# Patient Record
Sex: Male | Born: 1990 | Race: Black or African American | Hispanic: No | Marital: Single | Smoking: Never smoker
Health system: Southern US, Community
[De-identification: ages and names within clinical notes are randomized; demographics above are authoritative.]

## PROBLEM LIST (undated history)

## (undated) DIAGNOSIS — I10 Essential (primary) hypertension: Secondary | ICD-10-CM

## (undated) DIAGNOSIS — E785 Hyperlipidemia, unspecified: Secondary | ICD-10-CM

## (undated) DIAGNOSIS — J189 Pneumonia, unspecified organism: Secondary | ICD-10-CM

## (undated) DIAGNOSIS — N189 Chronic kidney disease, unspecified: Secondary | ICD-10-CM

## (undated) HISTORY — DX: Chronic kidney disease, unspecified: N18.9

---
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
## 2015-10-08 ENCOUNTER — Encounter (HOSPITAL_BASED_OUTPATIENT_CLINIC_OR_DEPARTMENT_OTHER): Payer: Self-pay | Admitting: *Deleted

## 2015-10-08 ENCOUNTER — Inpatient Hospital Stay (HOSPITAL_BASED_OUTPATIENT_CLINIC_OR_DEPARTMENT_OTHER)
Admission: EM | Admit: 2015-10-08 | Discharge: 2015-10-30 | DRG: 981 | Disposition: A | Discharge status: Home / Self Care | Payer: Medicaid Other | Attending: Internal Medicine | Admitting: Internal Medicine

## 2015-10-08 ENCOUNTER — Inpatient Hospital Stay (HOSPITAL_COMMUNITY): Payer: Medicaid Other

## 2015-10-08 DIAGNOSIS — J810 Acute pulmonary edema: Secondary | ICD-10-CM | POA: Diagnosis present

## 2015-10-08 DIAGNOSIS — J189 Pneumonia, unspecified organism: Secondary | ICD-10-CM | POA: Diagnosis not present

## 2015-10-08 DIAGNOSIS — R339 Retention of urine, unspecified: Secondary | ICD-10-CM | POA: Diagnosis not present

## 2015-10-08 DIAGNOSIS — K219 Gastro-esophageal reflux disease without esophagitis: Secondary | ICD-10-CM | POA: Diagnosis present

## 2015-10-08 DIAGNOSIS — I120 Hypertensive chronic kidney disease with stage 5 chronic kidney disease or end stage renal disease: Principal | ICD-10-CM | POA: Diagnosis present

## 2015-10-08 DIAGNOSIS — M311 Thrombotic microangiopathy: Secondary | ICD-10-CM | POA: Diagnosis present

## 2015-10-08 DIAGNOSIS — E876 Hypokalemia: Secondary | ICD-10-CM | POA: Diagnosis present

## 2015-10-08 DIAGNOSIS — Z6834 Body mass index (BMI) 34.0-34.9, adult: Secondary | ICD-10-CM

## 2015-10-08 DIAGNOSIS — J020 Streptococcal pharyngitis: Secondary | ICD-10-CM | POA: Diagnosis present

## 2015-10-08 DIAGNOSIS — Z992 Dependence on renal dialysis: Secondary | ICD-10-CM | POA: Diagnosis not present

## 2015-10-08 DIAGNOSIS — R0902 Hypoxemia: Secondary | ICD-10-CM | POA: Insufficient documentation

## 2015-10-08 DIAGNOSIS — R0600 Dyspnea, unspecified: Secondary | ICD-10-CM | POA: Insufficient documentation

## 2015-10-08 DIAGNOSIS — D72829 Elevated white blood cell count, unspecified: Secondary | ICD-10-CM | POA: Diagnosis present

## 2015-10-08 DIAGNOSIS — R319 Hematuria, unspecified: Secondary | ICD-10-CM | POA: Diagnosis present

## 2015-10-08 DIAGNOSIS — I160 Hypertensive urgency: Secondary | ICD-10-CM | POA: Diagnosis present

## 2015-10-08 DIAGNOSIS — D638 Anemia in other chronic diseases classified elsewhere: Secondary | ICD-10-CM | POA: Diagnosis present

## 2015-10-08 DIAGNOSIS — T380X5A Adverse effect of glucocorticoids and synthetic analogues, initial encounter: Secondary | ICD-10-CM | POA: Diagnosis present

## 2015-10-08 DIAGNOSIS — J9601 Acute respiratory failure with hypoxia: Secondary | ICD-10-CM | POA: Diagnosis not present

## 2015-10-08 DIAGNOSIS — E8770 Fluid overload, unspecified: Secondary | ICD-10-CM | POA: Diagnosis not present

## 2015-10-08 DIAGNOSIS — N186 End stage renal disease: Secondary | ICD-10-CM | POA: Diagnosis present

## 2015-10-08 DIAGNOSIS — E669 Obesity, unspecified: Secondary | ICD-10-CM | POA: Diagnosis present

## 2015-10-08 DIAGNOSIS — E871 Hypo-osmolality and hyponatremia: Secondary | ICD-10-CM | POA: Diagnosis present

## 2015-10-08 DIAGNOSIS — N059 Unspecified nephritic syndrome with unspecified morphologic changes: Secondary | ICD-10-CM

## 2015-10-08 DIAGNOSIS — N008 Acute nephritic syndrome with other morphologic changes: Secondary | ICD-10-CM

## 2015-10-08 DIAGNOSIS — R011 Cardiac murmur, unspecified: Secondary | ICD-10-CM | POA: Diagnosis present

## 2015-10-08 DIAGNOSIS — T1490XA Injury, unspecified, initial encounter: Secondary | ICD-10-CM

## 2015-10-08 DIAGNOSIS — N189 Chronic kidney disease, unspecified: Secondary | ICD-10-CM

## 2015-10-08 DIAGNOSIS — N179 Acute kidney failure, unspecified: Secondary | ICD-10-CM | POA: Insufficient documentation

## 2015-10-08 DIAGNOSIS — R0602 Shortness of breath: Secondary | ICD-10-CM

## 2015-10-08 DIAGNOSIS — Y95 Nosocomial condition: Secondary | ICD-10-CM | POA: Diagnosis not present

## 2015-10-08 DIAGNOSIS — J029 Acute pharyngitis, unspecified: Secondary | ICD-10-CM | POA: Diagnosis present

## 2015-10-08 DIAGNOSIS — D696 Thrombocytopenia, unspecified: Secondary | ICD-10-CM | POA: Diagnosis present

## 2015-10-08 DIAGNOSIS — N170 Acute kidney failure with tubular necrosis: Secondary | ICD-10-CM | POA: Diagnosis present

## 2015-10-08 DIAGNOSIS — I10 Essential (primary) hypertension: Secondary | ICD-10-CM | POA: Diagnosis present

## 2015-10-08 HISTORY — DX: Essential (primary) hypertension: I10

## 2015-10-08 LAB — URINALYSIS, ROUTINE W REFLEX MICROSCOPIC
Bilirubin Urine: NEGATIVE
GLUCOSE, UA: NEGATIVE mg/dL
Ketones, ur: NEGATIVE mg/dL
LEUKOCYTES UA: NEGATIVE
Nitrite: NEGATIVE
PH: 5 (ref 5.0–8.0)
Protein, ur: >300 mg/dL — AB
SPECIFIC GRAVITY, URINE: 1.021 (ref 1.005–1.030)

## 2015-10-08 LAB — BASIC METABOLIC PANEL
ANION GAP: 11 (ref 5–15)
BUN: 57 mg/dL — ABNORMAL HIGH (ref 6–20)
CHLORIDE: 104 mmol/L (ref 101–111)
CO2: 22 mmol/L (ref 22–32)
Calcium: 8.6 mg/dL — ABNORMAL LOW (ref 8.9–10.3)
Creatinine, Ser: 7.17 mg/dL — ABNORMAL HIGH (ref 0.61–1.24)
GFR calc non Af Amer: 9 mL/min — ABNORMAL LOW (ref >60)
GFR, EST AFRICAN AMERICAN: 11 mL/min — AB (ref >60)
Glucose, Bld: 97 mg/dL (ref 65–99)
POTASSIUM: 3.4 mmol/L — AB (ref 3.5–5.1)
Sodium: 137 mmol/L (ref 135–145)

## 2015-10-08 LAB — URINE MICROSCOPIC-ADD ON

## 2015-10-08 LAB — PROTEIN / CREATININE RATIO, URINE
CREATININE, URINE: 122.52 mg/dL
PROTEIN CREATININE RATIO: 3.93 mg/mg{creat} — AB (ref 0.00–0.15)
TOTAL PROTEIN, URINE: 481 mg/dL

## 2015-10-08 LAB — CBC WITH DIFFERENTIAL/PLATELET
BASOS ABS: 0 10*3/uL (ref 0.0–0.1)
BASOS PCT: 0 %
Band Neutrophils: 2 %
EOS PCT: 0 %
Eosinophils Absolute: 0 10*3/uL (ref 0.0–0.7)
HEMATOCRIT: 38.4 % — AB (ref 39.0–52.0)
HEMOGLOBIN: 13.5 g/dL (ref 13.0–17.0)
LYMPHS PCT: 9 %
Lymphs Abs: 1.5 10*3/uL (ref 0.7–4.0)
MCH: 29.2 pg (ref 26.0–34.0)
MCHC: 35.2 g/dL (ref 30.0–36.0)
MCV: 83.1 fL (ref 78.0–100.0)
Monocytes Absolute: 0.7 10*3/uL (ref 0.1–1.0)
Monocytes Relative: 4 %
NEUTROS PCT: 85 %
Neutro Abs: 14.9 10*3/uL — ABNORMAL HIGH (ref 1.7–7.7)
Platelets: 120 10*3/uL — ABNORMAL LOW (ref 150–400)
RBC: 4.62 MIL/uL (ref 4.22–5.81)
RDW: 13.6 % (ref 11.5–15.5)
WBC: 17.1 10*3/uL — AB (ref 4.0–10.5)

## 2015-10-08 LAB — RAPID STREP SCREEN (MED CTR MEBANE ONLY): Streptococcus, Group A Screen (Direct): POSITIVE — AB

## 2015-10-08 MED ORDER — ONDANSETRON HCL 4 MG/2ML IJ SOLN
4.0000 mg | Freq: Four times a day (QID) | INTRAMUSCULAR | Status: DC | PRN
  Administered 2015-10-18 – 2015-10-30 (×7): 4 mg via INTRAVENOUS
  Filled 2015-10-08 (×6): qty 2

## 2015-10-08 MED ORDER — CLONIDINE HCL 0.1 MG PO TABS: 0.1000 mg | ORAL_TABLET | Freq: Two times a day (BID) | ORAL | Status: DC

## 2015-10-08 MED ORDER — ACETAMINOPHEN 325 MG PO TABS
650.0000 mg | ORAL_TABLET | Freq: Four times a day (QID) | ORAL | Status: DC | PRN
  Administered 2015-10-09 – 2015-10-23 (×10): 650 mg via ORAL
  Filled 2015-10-08 (×10): qty 2

## 2015-10-08 MED ORDER — HYDRALAZINE HCL 20 MG/ML IJ SOLN
20.0000 mg | Freq: Four times a day (QID) | INTRAMUSCULAR | Status: DC | PRN
  Administered 2015-10-08 – 2015-10-18 (×13): 20 mg via INTRAVENOUS
  Filled 2015-10-08 (×13): qty 1

## 2015-10-08 MED ORDER — METOPROLOL TARTRATE 25 MG PO TABS
25.0000 mg | ORAL_TABLET | Freq: Two times a day (BID) | ORAL | Status: DC
  Administered 2015-10-08 – 2015-10-09 (×3): 25 mg via ORAL
  Filled 2015-10-08 (×3): qty 1

## 2015-10-08 MED ORDER — HEPARIN SODIUM (PORCINE) 5000 UNIT/ML IJ SOLN
5000.0000 [IU] | Freq: Three times a day (TID) | INTRAMUSCULAR | Status: AC
  Administered 2015-10-08 – 2015-10-14 (×16): 5000 [IU] via SUBCUTANEOUS
  Filled 2015-10-08 (×13): qty 1

## 2015-10-08 MED ORDER — AMOXICILLIN 500 MG PO CAPS
500.0000 mg | ORAL_CAPSULE | Freq: Two times a day (BID) | ORAL | Status: DC
  Administered 2015-10-08 – 2015-10-14 (×12): 500 mg via ORAL
  Filled 2015-10-08 (×13): qty 1

## 2015-10-08 MED ORDER — SODIUM CHLORIDE 0.9 % IV SOLN: INTRAVENOUS | Status: DC

## 2015-10-08 MED ORDER — CLONIDINE HCL 0.2 MG PO TABS
0.2000 mg | ORAL_TABLET | Freq: Two times a day (BID) | ORAL | Status: DC
  Administered 2015-10-08 – 2015-10-09 (×2): 0.2 mg via ORAL
  Filled 2015-10-08 (×2): qty 1

## 2015-10-08 MED ORDER — ACETAMINOPHEN 650 MG RE SUPP: 650.0000 mg | Freq: Four times a day (QID) | RECTAL | Status: DC | PRN

## 2015-10-08 MED ORDER — LABETALOL HCL 5 MG/ML IV SOLN
10.0000 mg | Freq: Once | INTRAVENOUS | Status: AC
  Administered 2015-10-08: 10 mg via INTRAVENOUS
  Filled 2015-10-08: qty 4

## 2015-10-08 MED ORDER — HYDROCODONE-ACETAMINOPHEN 5-325 MG PO TABS
1.0000 | ORAL_TABLET | ORAL | Status: DC | PRN
  Administered 2015-10-10: 1 via ORAL
  Administered 2015-10-15: 2 via ORAL
  Administered 2015-10-15: 1 via ORAL
  Administered 2015-10-19 – 2015-10-20 (×4): 2 via ORAL
  Administered 2015-10-20: 1 via ORAL
  Administered 2015-10-20 – 2015-10-29 (×9): 2 via ORAL
  Filled 2015-10-08 (×6): qty 2
  Filled 2015-10-08: qty 1
  Filled 2015-10-08 (×2): qty 2
  Filled 2015-10-08: qty 1
  Filled 2015-10-08 (×3): qty 2
  Filled 2015-10-08: qty 1
  Filled 2015-10-08 (×4): qty 2

## 2015-10-08 MED ORDER — POLYETHYLENE GLYCOL 3350 17 G PO PACK
17.0000 g | PACK | Freq: Every day | ORAL | Status: DC | PRN
  Filled 2015-10-08: qty 1

## 2015-10-08 MED ORDER — MORPHINE SULFATE (PF) 2 MG/ML IV SOLN: 1.0000 mg | INTRAVENOUS | Status: DC | PRN

## 2015-10-08 MED ORDER — CLONIDINE HCL 0.1 MG PO TABS
0.1000 mg | ORAL_TABLET | Freq: Every day | ORAL | Status: DC
  Administered 2015-10-08: 0.1 mg via ORAL
  Filled 2015-10-08: qty 1

## 2015-10-08 MED ORDER — SODIUM CHLORIDE 0.9 % IV BOLUS (SEPSIS)
1000.0000 mL | Freq: Once | INTRAVENOUS | Status: AC
  Administered 2015-10-08: 1000 mL via INTRAVENOUS

## 2015-10-08 MED ORDER — AMLODIPINE BESYLATE 10 MG PO TABS
10.0000 mg | ORAL_TABLET | Freq: Every day | ORAL | Status: DC
  Administered 2015-10-08 – 2015-10-30 (×22): 10 mg via ORAL
  Filled 2015-10-08 (×23): qty 1

## 2015-10-08 MED ORDER — ONDANSETRON HCL 4 MG PO TABS
4.0000 mg | ORAL_TABLET | Freq: Four times a day (QID) | ORAL | Status: DC | PRN
  Administered 2015-10-19 – 2015-10-28 (×3): 4 mg via ORAL
  Filled 2015-10-08 (×3): qty 1

## 2015-10-08 MED ORDER — CEPHALEXIN 250 MG PO CAPS
500.0000 mg | ORAL_CAPSULE | Freq: Once | ORAL | Status: AC
  Administered 2015-10-08: 500 mg via ORAL
  Filled 2015-10-08: qty 2

## 2015-10-08 NOTE — Progress Notes (Signed)
10/08/2015 3:30 PM  Spoke with Claris CheMargaret with Infection prevention--isolation precautions/droplet precautions NOT needed with strep diagnosis in adults.  No precautions initiated.    Theadora RamaKIRKMAN, Lumina Gitto Brooke

## 2015-10-08 NOTE — Consult Note (Addendum)
Renal Consultation Requesting Physician:  Dr. Montez Morita Reason for Consult:  Renal failure  HPI: The patient is a 25 y.o. year-old who presented to [facility] with PMH significant only for HTN, with a 1 week history of fever, cough, sore throat, weakness.  His evaluation there was significant for hypertension (204/152), leukocytosis to 17,000, mild thrombocytopenia of 120K, creatinine of 7.17 and baseline creatinine not known. Urinalysis reveals >300 protein, large blood, TNTC RBC's. Rapid strep test +. We are asked to see.   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  Has been compliant with meds, but was not doing routine home monitoring. Says took BP with his mother's machine today and it was "off the scale". He also says that he was told in 2014 that his "kidney function was a little off" but "not too bad". States has had labs done at the [facility] in the past 3 years (currently not open). He's not sure what his creatinine was. He has had ankle edema mild L>R since 2014. No FH of renal disease. No drug use.   Reports to me sick last week with sore throat - he "assumed" it was strep but he did not see anyone about it. Had chills and fever for a couple of days, +cough, mild SOB. No arthralgias or skin rash. Saw a "drop" of blood in his urine, but the urine otherwise clear, no tea or coca cola colored urine. No foam.  Has been eating and drinking normally this week. No nausea or vomiting.    CREATININE, SER  Date/Time Value Ref Range Status  10/08/2015 09:20 AM 7.17* 0.61 - 1.24 mg/dL Final    Past Medical History  Diagnosis Date  . Hypertension    Past Surgical History  Procedure Laterality Date  . Pilonidal cyst / sinus excision     Family History  Problem Relation Age of Onset  . Hypertension Mother   . Diabetes Mother   . Hypertension Brother   . Diabetes Sister   . CAD Father    Social History:   reports that he has never smoked. He has never used smokeless tobacco. He reports that he does not drink alcohol or use illicit drugs.  Allergies: No Known Allergies  Home medications: Prior to Admission medications   Medication Sig Start Date End Date Taking? Authorizing Provider  amLODipine (NORVASC) 10 MG tablet Take 10 mg by mouth daily.   Yes Historical Provider, MD  aspirin EC 325 MG tablet Take 325 mg by mouth daily as needed (for headaches).   Yes Historical Provider, MD  cloNIDine (CATAPRES) 0.1 MG tablet Take 0.1 mg by mouth daily.   Yes Historical Provider, MD  furosemide (LASIX) 20 MG tablet Take 20 mg by mouth 2 (two) times daily.   Yes Historical Provider, MD  metoprolol tartrate (LOPRESSOR) 25 MG tablet Take 25 mg by mouth 2 (two) times daily.   Yes Historical Provider, MD    Inpatient medications: . amLODipine  10 mg Oral Daily  . amoxicillin  500 mg Oral Q12H  . cloNIDine  0.1 mg Oral Daily  . heparin  5,000 Units Subcutaneous Q8H  . metoprolol tartrate  25 mg Oral BID    Review of Systems See HPI   Physical Exam:  BP 192/124 mmHg  Pulse 70  Temp(Src) 97.7 F (36.5 C) (Oral)  Resp 18  Ht  (1.778 m)  Wt 161.096  kg (240 lb)  BMI 34.44 kg/m2  SpO2 99%  Very nice soft spoken large framed AAM, NAD VS as noted No skin rashes noted No JVD Lungs clear PMI lat displaced S1S2 + S4 No S3 Soft 1/6 murmur LSB no diastolic murmur or rub Abdomen obese. BS normal. Liver edge not felt. No focal abd tenderness 1+ edema pitting both LE's, L ankle edema >R No asterixus Neuro: alert, Ox3, no focal deficit Heme/Lymph: no bruising or LAN   Labs:   Recent Labs Lab 10/08/15 0920  NA 137  K 3.4*  CL 104  CO2 22  GLUCOSE 97  BUN 57*  CREATININE 7.17*  CALCIUM 8.6*      Recent Labs Lab 10/08/15 0920  WBC 17.1*  NEUTROABS 14.9*  HGB 13.5  HCT 38.4*  MCV 83.1  PLT 120*    Xrays/Other Studies: [facility]  10/08/2015  CLINICAL DATA:  Acute renal  failure. Hematuria today. Weakness, fever and fatigue. Strep pharyngitis for the past week. EXAM: RENAL / URINARY TRACT ULTRASOUND COMPLETE COMPARISON:  06/14/2012. FINDINGS: Right Kidney: Length: 11.1 cm. Markedly echogenic with marked progression. No hydronephrosis, mass or calculi seen. Left Kidney: Length: 11.1 cm. Markedly echogenic with marked progression. Poorly visualized collecting system without gross hydronephrosis. No mass or calculi seen. Bladder: Appears normal for degree of bladder distention. Additional finding:  Small right pleural effusion. IMPRESSION: 1. Interval markedly increased echogenicity of both kidneys compatible with severe medical renal disease. 2. No definite hydronephrosis. 3. Small right pleural effusion. Electronically Signed   By: Beckie SaltsSteven  Reid M.D.   On: 10/08/2015 16:56   Background 25 y.o. year-old who presented to [facility] with PMH significant for HTN, with a 1 week history of fever, cough, sore throat, weakness (he called it strep throat - but was not tested anywhere).  His evaluation there was significant for hypertension (204/152), leukocytosis to 17,000, mild thrombocytopenia of 120K, creatinine of 7.17 and baseline creatinine not known. Urinalysis reveals >300 protein, large blood, TNTC RBC's.  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 Reports told at that time that "kidney function was a little off" . Has had labs done at the [facility] in the past 3 years (currently not open). Ultrasound shows echodense kidneys. UA with >300 protein, TNTC RBC's.   Assessment/Recommendations  1. Renal failure - proteinuria, hematuria, echodense kidneys, and prior knowledge that "kidney function was not normal" in 2014 may mean this is progression of underlying disease and not acute kidney injury. However, with proteinuria, hematuria, and recent pharyngitis with + strep test, could be dealing with post  infectious GN.   1. Either way, I think once his BP is controlled he will need a renal biopsy for diagnosis . 2. Serologies and urine for GN workup ordered.  3. Would stop IVF as he has edema, and does not appear hypovolemic on exam. 4. Call [facility] in the AM and see if any old labs 2. Uncontrolled HTN  - recent degree of control unknown. Meds need escalation.  1. Increase clonidine to 0.2 BID.  2. Continue amlodipine and metoprolol.  3. Stop IVF (may need to add diuretic next day or so). 4. Cannot set up for renal bx until BP controlled 3. Leukocytosis - + strep. On amoxicillin.  1. CXR (cough/sob) 4. Thrombocytopenia - plts 120K and no other labs to compare 1. Would like to see >150 for safe renal bx  Camille Balynthia Saraphina Lauderbaugh,  MD [facility] [phone] pager 10/08/2015, 4:12 PM

## 2015-10-08 NOTE — Progress Notes (Signed)
Patient accepted for admission to telemetry bed.  Coming from [facility].  Diagnosis: post streptococcal glomerulonephritis.  Has positive rapid strep test.  Has had fever, sore throat, and malaise for one week.  Presents with accelerated HTN, AKI (Creatinine 7), and hematuria.  Plan to consult nephrology upon arrival.

## 2015-10-08 NOTE — ED Notes (Signed)
Patient states he has a one week history of sore throat, fever and generalized fatigue.  States this morning he urinated and there was blood in his urine.

## 2015-10-08 NOTE — ED Provider Notes (Signed)
CSN: [id number]     Arrival date & time 10/08/15  [id number] History   First MD Initiated Contact with Patient 10/08/15 [phone]     Chief Complaint  Patient presents with  . Sore Throat  . Hematuria     (Consider location/radiation/quality/duration/timing/severity/associated sxs/prior Treatment) HPI Comments: Patient is a 25 year old male with history of hypertension. He presents for evaluation of sore throat, intermittent fever, and fatigue for the past week. He reports this morning when he woke up to urinate there is a small amount of blood in his urine. He denies any abdominal pain, dysuria, or flank pain. He denies a history of kidney stones. He denies any ill contacts.  Patient is a 25 y.o. male presenting with pharyngitis. The history is provided by the patient.  Sore Throat This is a new problem. Episode onset: 1 week ago. The problem occurs constantly. The problem has been gradually worsening. The symptoms are aggravated by swallowing. Nothing relieves the symptoms. He has tried acetaminophen for the symptoms.    Past Medical History  Diagnosis Date  . Hypertension    Past Surgical History  Procedure Laterality Date  . Pilonidal cyst / sinus excision     No family history on file. Social History  Substance Use Topics  . Smoking status: Never Smoker   . Smokeless tobacco: Never Used  . Alcohol Use: No    Review of Systems  All other systems reviewed and are negative.     Allergies  Review of patient's allergies indicates no known allergies.  Home Medications   Prior to Admission medications   Medication Sig Start Date End Date Taking? Authorizing Provider  amLODipine (NORVASC) 10 MG tablet Take 10 mg by mouth daily.   Yes Historical Provider, MD  cloNIDine (CATAPRES) 0.1 MG tablet Take 0.1 mg by mouth daily.   Yes Historical Provider, MD  furosemide (LASIX) 20 MG tablet Take 20 mg by mouth 2 (two) times daily.   Yes Historical Provider, MD  metoprolol tartrate  (LOPRESSOR) 25 MG tablet Take 25 mg by mouth 2 (two) times daily.   Yes Historical Provider, MD   BP 204/152 mmHg  Pulse 82  Temp(Src) 98.1 F (36.7 C) (Oral)  Resp 18  Ht 5\' 10"  (1.778 m)  Wt 240 lb (108.863 kg)  BMI 34.44 kg/m2  SpO2 100% Physical Exam  Constitutional: He is oriented to person, place, and time. He appears well-developed and well-nourished. No distress.  HENT:  Head: Normocephalic and atraumatic.  Posterior oropharynx is erythematous.  Neck: Normal range of motion. Neck supple.  Cardiovascular: Normal rate and regular rhythm.  Exam reveals no friction rub.   No murmur heard. Pulmonary/Chest: Effort normal and breath sounds normal. No respiratory distress. He has no wheezes. He has no rales.  Abdominal: Soft. Bowel sounds are normal. He exhibits no distension. There is no tenderness.  Musculoskeletal: Normal range of motion. He exhibits no edema.  Neurological: He is alert and oriented to person, place, and time. Coordination normal.  Skin: Skin is warm and dry. He is not diaphoretic.  Nursing note and vitals reviewed.   ED Course  Procedures (including critical care time) Labs Review Labs Reviewed  RAPID STREP SCREEN (NOT AT [facility])  URINALYSIS, ROUTINE W REFLEX MICROSCOPIC (NOT AT [facility])  BASIC METABOLIC PANEL  CBC WITH DIFFERENTIAL/PLATELET    Imaging Review No results found. I have personally reviewed and evaluated these images and lab results as part of my medical decision-making.    MDM  Final diagnoses:  None    Patient presents with complaints of weakness, fever, fatigue, and sore throat. Based on his laboratory studies, he appears to be in renal failure related to either poststreptococcal glomerulonephritis or hypertension induced nephropathy. His blood pressures were initially markedly elevated, however did improve while in the ER. Due to the market elevation of his creatinine, I feel as though he will require admission for nephrology  consultation and hydration. He will also be given Keflex for the strep throat. I've spoken with Dr. Montez Moritaarter from the hospitalist service who agrees to admit the patient.    Geoffery Lyonsouglas Halo Shevlin, MD 10/08/15 1030

## 2015-10-08 NOTE — H&P (Signed)
History and Physical    Joseph Villegas ZOX:[id number] DOB: [birth year] DOA: 10/08/2015  PCP: No PCP Per Patient Clinic [facility] Patient coming from: [facility]  Chief Complaint: persistent sore throat, intermittent fever and generalized weakness  HPI: Joseph Villegas is a very pleasnat 25 y.o. male with medical history significant for htn presents to [facility] with the chief complaint of persistent sore throat, intermittent fevers and generalized weakness. Initial evaluation reveals acute renal failure with a creatinine of 7, accelerated hypertension, positive rapid strep test, hematuria, leukocytosis, thrombocytopenia.  Information is obtained from the patient. He reports a one-week history of  intermittent fever and generalized fatigue. In addition this am he noted "small amount blood" at void end. He denies dysuria frequency urgency. He denies a history of kidney stones. He denies any flank pain abdominal pain nausea or vomiting. He states the sore throat is aggravated by swallowing. He reports taking acetaminophen for the symptoms with very little relief. He denies any recent travel or sick contacts. Does report chronic trace lower extremity edema after a days work and reports the edema subsides once feet are elevated. He denies chest pain palpitation shortness of breath cough headache dizziness syncope or near-syncope. He denies any orthopnea.    ED Course: At [facility] he is provided with 20 mg of labetalol, vigorous IV fluids, and Keflex  Review of Systems: As per HPI otherwise 10 point review of systems negative.   Ambulatory Status: Ambulates independently with a steady gait  Past Medical History  Diagnosis Date  . Hypertension     Past Surgical History  Procedure Laterality Date  . Pilonidal cyst / sinus excision      Social History   Social History  . Marital Status: Single    Spouse Name: N/A  . Number of Children: N/A  . Years  of Education: N/A   Occupational History  . Not on file.   Social History Main Topics  . Smoking status: Never Smoker   . Smokeless tobacco: Never Used  . Alcohol Use: No  . Drug Use: No  . Sexual Activity: Not on file   Other Topics Concern  . Not on file   Social History Narrative  . No narrative on file  Patient is single he lives alone he is currently employed full-time as a Administratorlandscaper  No Known Allergies  Family History  Problem Relation Age of Onset  . Hypertension Mother   . Diabetes Mother   . Hypertension Brother   . Diabetes Sister   . CAD Father     Prior to Admission medications   Medication Sig Start Date End Date Taking? Authorizing Provider  amLODipine (NORVASC) 10 MG tablet Take 10 mg by mouth daily.   Yes Historical Provider, MD  aspirin EC 325 MG tablet Take 325 mg by mouth daily as needed (for headaches).   Yes Historical Provider, MD  cloNIDine (CATAPRES) 0.1 MG tablet Take 0.1 mg by mouth daily.   Yes Historical Provider, MD  furosemide (LASIX) 20 MG tablet Take 20 mg by mouth 2 (two) times daily.   Yes Historical Provider, MD  metoprolol tartrate (LOPRESSOR) 25 MG tablet Take 25 mg by mouth 2 (two) times daily.   Yes Historical Provider, MD    Physical Exam: Filed Vitals:   10/08/15 0825 10/08/15 0848 10/08/15 1056 10/08/15 1249  BP: 223/166 204/152 178/126 192/124  Pulse:   65 70  Temp:    97.7 F (  36.5 C)  TempSrc:    Oral  Resp:   20 18  Height:      Weight:      SpO2:   100% 99%     General:  Appears calm and comfortable, somewhat obese Eyes:  PERRL, EOMI, normal lids, iris ENT:  grossly normal hearing, lips & tongue, oropharynx with erythema no exudate noted Neck:  no LAD, masses or thyromegaly Cardiovascular:  RRR, no m/r/g. Trace LE edema.  Respiratory:  CTA bilaterally, no w/r/r. Normal respiratory effort. Abdomen:  soft, ntnd, positive bowel sounds throughout no guarding or rebounding Skin:  no rash or induration seen on  limited exam Musculoskeletal:  grossly normal tone BUE/BLE, good ROM, no bony abnormality Psychiatric:  grossly normal mood and affect, speech fluent and appropriate, AOx3 Neurologic:  CN 2-12 grossly intact, moves all extremities in coordinated fashion, sensation intact  Labs on Admission: I have personally reviewed following labs and imaging studies  CBC:  Recent Labs Lab 10/08/15 0920  WBC 17.1*  NEUTROABS 14.9*  HGB 13.5  HCT 38.4*  MCV 83.1  PLT 120*   Basic Metabolic Panel:  Recent Labs Lab 10/08/15 0920  NA 137  K 3.4*  CL 104  CO2 22  GLUCOSE 97  BUN 57*  CREATININE 7.17*  CALCIUM 8.6*   GFR: Estimated Creatinine Clearance: 19.3 mL/min (by C-G formula based on Cr of 7.17). Liver Function Tests: No results for input(s): AST, ALT, ALKPHOS, BILITOT, PROT, ALBUMIN in the last 168 hours. No results for input(s): LIPASE, AMYLASE in the last 168 hours. No results for input(s): AMMONIA in the last 168 hours. Coagulation Profile: No results for input(s): INR, PROTIME in the last 168 hours. Cardiac Enzymes: No results for input(s): CKTOTAL, CKMB, CKMBINDEX, TROPONINI in the last 168 hours. BNP (last 3 results) No results for input(s): PROBNP in the last 8760 hours. HbA1C: No results for input(s): HGBA1C in the last 72 hours. CBG: No results for input(s): GLUCAP in the last 168 hours. Lipid Profile: No results for input(s): CHOL, HDL, LDLCALC, TRIG, CHOLHDL, LDLDIRECT in the last 72 hours. Thyroid Function Tests: No results for input(s): TSH, T4TOTAL, FREET4, T3FREE, THYROIDAB in the last 72 hours. Anemia Panel: No results for input(s): VITAMINB12, FOLATE, FERRITIN, TIBC, IRON, RETICCTPCT in the last 72 hours. Urine analysis:    Component Value Date/Time   COLORURINE YELLOW 10/08/2015 0905   APPEARANCEUR CLOUDY* 10/08/2015 0905   LABSPEC 1.021 10/08/2015 0905   PHURINE 5.0 10/08/2015 0905   GLUCOSEU NEGATIVE 10/08/2015 0905   HGBUR LARGE* 10/08/2015 0905    BILIRUBINUR NEGATIVE 10/08/2015 0905   KETONESUR NEGATIVE 10/08/2015 0905   PROTEINUR >300* 10/08/2015 0905   NITRITE NEGATIVE 10/08/2015 0905   LEUKOCYTESUR NEGATIVE 10/08/2015 0905    Creatinine Clearance: Estimated Creatinine Clearance: 19.3 mL/min (by C-G formula based on Cr of 7.17).  Sepsis Labs: @LABRCNTIP (procalcitonin:4,lacticidven:4) ) Recent Results (from the past 240 hour(s))  Rapid strep screen     Status: Abnormal   Collection Time: 10/08/15  8:20 AM  Result Value Ref Range Status   Streptococcus, Group A Screen (Direct) POSITIVE (A) NEGATIVE Final     Radiological Exams on Admission: No results found.  EKG:   Assessment/Plan Principal Problem:   Acute kidney injury (HCC) Active Problems:   Post-streptococcal glomerulonephritis   HTN (hypertension)   Thrombocytopenia (HCC)   Hematuria   Leukocytosis   Accelerated hypertension   Streptococcal sore throat   #1. Acute kidney injury. Concern for poststreptococcal glomerulonephritis in setting of  uncontrolled BP. Creatinine 7.1 and BUN 57 potassium 3.4 on admission. Urinalysis with large Hg.  -Admit to telemetry -Obtain a renal ultrasound -Antibiotics for strep throat -gentle IV fluids until hydration recommendations from nephrology -intake and output -daily weight  #2. Accelerated hypertension. Home medications include amlodipine, clonidine, Lasix, metoprolol. He was provided with 20 mg of labetalol at [facility]. Blood pressure on admission 192/124. He reports compliance with blood pressure medication -Continue amlodipine, clonidine, metoprolol -Hold Lasix for now -When necessary hydralazine -monitor closely  #3. Streptococcal sore throat. Positive rapid strep test. Patient given Keflex at [facility]. He is afebrile nontoxic appearing. Does have a leukocytosis of 17.1 -provide amoxicillin  BID starting tonight -monitor -supportive therapy  #4.thrombocytopenia. Platelets  120. Likely related to acute illness. No s/sx bleeding -monitor  #5. Hematuria. Urinalysis as noted above. No pain. No hx stones.  -obtain renal US -see #1    DVT prophylaxis: scd  Code Status: full  Family Communication: none present  Disposition Plan: home  Consults called: [place] with nephrology Admission status: inpatient    Gwenyth Bender MD [facility]  If 7PM-7AM, please contact night-coverage [email or web address] Password [email or web address]  10/08/2015, 2:21 PM

## 2015-10-08 NOTE — ED Notes (Signed)
[facility] is aware of bed 6E10 and here for transfer to [facility].

## 2015-10-08 NOTE — Progress Notes (Signed)
10/08/2015 6:46 PM  BP recheck still elevated over 200 systolically.  PRN hydralazine ordered--administered per order.  Pt vitals stable otherwise, denies chest pain or SOB.  IVF stopped per MD order.  Will continue to monitor. Theadora RamaKIRKMAN, Jurney Overacker Brooke

## 2015-10-09 DIAGNOSIS — R319 Hematuria, unspecified: Secondary | ICD-10-CM

## 2015-10-09 DIAGNOSIS — N059 Unspecified nephritic syndrome with unspecified morphologic changes: Secondary | ICD-10-CM | POA: Insufficient documentation

## 2015-10-09 DIAGNOSIS — D696 Thrombocytopenia, unspecified: Secondary | ICD-10-CM

## 2015-10-09 LAB — C4 COMPLEMENT: COMPLEMENT C4, BODY FLUID: 40 mg/dL (ref 14–44)

## 2015-10-09 LAB — COMPREHENSIVE METABOLIC PANEL
ALT: 24 U/L (ref 17–63)
AST: 18 U/L (ref 15–41)
Albumin: 2.8 g/dL — ABNORMAL LOW (ref 3.5–5.0)
Alkaline Phosphatase: 63 U/L (ref 38–126)
Anion gap: 11 (ref 5–15)
BILIRUBIN TOTAL: 0.5 mg/dL (ref 0.3–1.2)
BUN: 56 mg/dL — AB (ref 6–20)
CO2: 20 mmol/L — ABNORMAL LOW (ref 22–32)
CREATININE: 7.34 mg/dL — AB (ref 0.61–1.24)
Calcium: 8.7 mg/dL — ABNORMAL LOW (ref 8.9–10.3)
Chloride: 106 mmol/L (ref 101–111)
GFR, EST AFRICAN AMERICAN: 11 mL/min — AB (ref >60)
GFR, EST NON AFRICAN AMERICAN: 9 mL/min — AB (ref >60)
Glucose, Bld: 113 mg/dL — ABNORMAL HIGH (ref 65–99)
POTASSIUM: 3.2 mmol/L — AB (ref 3.5–5.1)
Sodium: 137 mmol/L (ref 135–145)
TOTAL PROTEIN: 7.4 g/dL (ref 6.5–8.1)

## 2015-10-09 LAB — PHOSPHORUS: Phosphorus: 4.6 mg/dL (ref 2.5–4.6)

## 2015-10-09 LAB — CBC
HEMATOCRIT: 38.1 % — AB (ref 39.0–52.0)
Hemoglobin: 13.1 g/dL (ref 13.0–17.0)
MCH: 29.1 pg (ref 26.0–34.0)
MCHC: 34.4 g/dL (ref 30.0–36.0)
MCV: 84.7 fL (ref 78.0–100.0)
PLATELETS: 121 10*3/uL — AB (ref 150–400)
RBC: 4.5 MIL/uL (ref 4.22–5.81)
RDW: 13.9 % (ref 11.5–15.5)
WBC: 14.1 10*3/uL — AB (ref 4.0–10.5)

## 2015-10-09 LAB — PLATELET FUNCTION ASSAY

## 2015-10-09 LAB — PROTIME-INR
INR: 1.16 (ref 0.00–1.49)
Prothrombin Time: 14.9 seconds (ref 11.6–15.2)

## 2015-10-09 LAB — ANTISTREPTOLYSIN O TITER: ASO: 70 IU/mL (ref 0.0–200.0)

## 2015-10-09 LAB — MPO/PR-3 (ANCA) ANTIBODIES

## 2015-10-09 LAB — APTT: APTT: 34 s (ref 24–37)

## 2015-10-09 LAB — C3 COMPLEMENT: C3 Complement: 144 mg/dL (ref 82–167)

## 2015-10-09 MED ORDER — LABETALOL HCL 5 MG/ML IV SOLN
10.0000 mg | Freq: Once | INTRAVENOUS | Status: AC
  Administered 2015-10-09: 10 mg via INTRAVENOUS
  Filled 2015-10-09: qty 4

## 2015-10-09 MED ORDER — POTASSIUM CHLORIDE CRYS ER 20 MEQ PO TBCR
20.0000 meq | EXTENDED_RELEASE_TABLET | Freq: Once | ORAL | Status: AC
  Administered 2015-10-09: 20 meq via ORAL
  Filled 2015-10-09: qty 1

## 2015-10-09 MED ORDER — FUROSEMIDE 80 MG PO TABS
160.0000 mg | ORAL_TABLET | Freq: Two times a day (BID) | ORAL | Status: DC
  Administered 2015-10-09 – 2015-10-20 (×22): 160 mg via ORAL
  Filled 2015-10-09 (×2): qty 2
  Filled 2015-10-09: qty 8
  Filled 2015-10-09 (×12): qty 2
  Filled 2015-10-09: qty 4
  Filled 2015-10-09 (×6): qty 2

## 2015-10-09 MED ORDER — CLONIDINE HCL 0.2 MG PO TABS
0.2000 mg | ORAL_TABLET | Freq: Three times a day (TID) | ORAL | Status: DC
  Administered 2015-10-09 – 2015-10-15 (×20): 0.2 mg via ORAL
  Filled 2015-10-09 (×20): qty 1

## 2015-10-09 MED ORDER — CARVEDILOL 25 MG PO TABS
25.0000 mg | ORAL_TABLET | Freq: Two times a day (BID) | ORAL | Status: DC
  Administered 2015-10-09 – 2015-10-30 (×39): 25 mg via ORAL
  Filled 2015-10-09 (×41): qty 1

## 2015-10-09 MED ORDER — CARVEDILOL 6.25 MG PO TABS: 6.2500 mg | ORAL_TABLET | Freq: Two times a day (BID) | ORAL | Status: DC

## 2015-10-09 NOTE — Progress Notes (Signed)
PROGRESS NOTE    Joseph Villegas  JXB:[id number] DOB: Oct 19, 1990 DOA: 10/08/2015 PCP: No PCP Per Patient  Outpatient Specialists:   Brief Narrative: 25 y.o. male with medical history significant for htn presents to [facility] with the chief complaint of persistent sore throat, intermittent fevers and generalized weakness. Initial evaluation reveals acute renal failure with a creatinine of 7, accelerated hypertension, positive rapid strep test, hematuria, leukocytosis, thrombocytopenia. Information is obtained from the patient. He reports a one-week history of intermittent fever and generalized fatigue. In addition this am he noted "small amount blood" at void end. He denies dysuria frequency urgency. He denies a history of kidney stones. He denies any flank pain abdominal pain nausea or vomiting. He states the sore throat is aggravated by swallowing. He reports taking acetaminophen for the symptoms with very little relief. He denies any recent travel or sick contacts. Does report chronic trace lower extremity edema after a days work and reports the edema subsides once feet are elevated. He denies chest pain palpitation shortness of breath cough headache dizziness syncope or near-syncope. He denies any orthopnea.  Assessment & Plan:   Principal Problem:   Acute kidney injury (HCC) Active Problems:   Post-streptococcal glomerulonephritis   HTN (hypertension)   Thrombocytopenia (HCC)   Hematuria   Leukocytosis   Accelerated hypertension   Streptococcal sore throat  Assessment/Plan Principal Problem:  Acute kidney injury (HCC) Active Problems:  Post-streptococcal glomerulonephritis  HTN (hypertension)  Thrombocytopenia (HCC)  Hematuria  Leukocytosis  Accelerated hypertension  Streptococcal sore throat   #1. kidney injury, possible progressive CKD versus AKI on CKD. History of HTN. BP is uncontrolled, with SBP in the 200's. Nephrology input is appreciated - GN work  up and possible renal biopsy is SBP is better controlled.  #2. Accelerated hypertension. Cautiously control. Change Metoprolol to Coreg Increase dose of clonidine. Monitor renal function and electrolytes.  #3. Streptococcal sore throat. Positive rapid strep test. Patient given Keflex at [facility]. He is afebrile nontoxic appearing. Does have a leukocytosis of 17.1 -provide amoxicillin  BID starting tonight -monitor -supportive therapy  #4.thrombocytopenia. Platelets 120. Likely related to acute illness. No s/sx bleeding -Will check acute hepatitis profile  #5. Hematuria. Possibly related to GN.  -see #1  DVT prophylaxis: scd  Code Status: full  Family Communication: none present  Disposition Plan: home  Consults called: dunham with nephrology Admission status: inpatient   Subjective: No complaints. No SOB or chest pain. SBP is still significantly elevated  Objective: Filed Vitals:   10/09/15 0203 10/09/15 0400 10/09/15 0451 10/09/15 0552  BP: 223/128 209/125 199/128 213/135  Pulse: 96 97 91 91  Temp:  98.2 F (36.8 C)    TempSrc:  Oral    Resp:  20    Height:      Weight:      SpO2:  97%      Intake/Output Summary (Last 24 hours) at 10/09/15 0901 Last data filed at 10/09/15 0630  Gross per 24 hour  Intake    360 ml  Output    500 ml  Net   -140 ml   Filed Weights   10/08/15 0819 10/08/15 2043  Weight: 108.863 kg (240 lb) 109.77 kg (242 lb)    Examination:  General exam: Appears calm and comfortable  Respiratory system: Clear to auscultation.  Cardiovascular system: S1 & S2 Gastrointestinal system: Abdomen is obese, soft and non tender. Central nervous system: Alert and oriented. Moves all limbs. Extremities: Leg edema.  Data Reviewed: I have personally reviewed following labs and imaging studies  CBC:  Recent Labs Lab 10/08/15 0920 10/09/15 0424  WBC 17.1* 14.1*  NEUTROABS 14.9*  --   HGB 13.5 13.1  HCT 38.4* 38.1*  MCV 83.1  84.7  PLT 120* 121*   Basic Metabolic Panel:  Recent Labs Lab 10/08/15 0920 10/09/15 0424  NA 137 137  K 3.4* 3.2*  CL 104 106  CO2 22 20*  GLUCOSE 97 113*  BUN 57* 56*  CREATININE 7.17* 7.34*  CALCIUM 8.6* 8.7*  PHOS  --  4.6   GFR: Estimated Creatinine Clearance: 18.9 mL/min (by C-G formula based on Cr of 7.34). Liver Function Tests:  Recent Labs Lab 10/09/15 0424  AST 18  ALT 24  ALKPHOS 63  BILITOT 0.5  PROT 7.4  ALBUMIN 2.8*   No results for input(s): LIPASE, AMYLASE in the last 168 hours. No results for input(s): AMMONIA in the last 168 hours. Coagulation Profile: No results for input(s): INR, PROTIME in the last 168 hours. Cardiac Enzymes: No results for input(s): CKTOTAL, CKMB, CKMBINDEX, TROPONINI in the last 168 hours. BNP (last 3 results) No results for input(s): PROBNP in the last 8760 hours. HbA1C: No results for input(s): HGBA1C in the last 72 hours. CBG: No results for input(s): GLUCAP in the last 168 hours. Lipid Profile: No results for input(s): CHOL, HDL, LDLCALC, TRIG, CHOLHDL, LDLDIRECT in the last 72 hours. Thyroid Function Tests: No results for input(s): TSH, T4TOTAL, FREET4, T3FREE, THYROIDAB in the last 72 hours. Anemia Panel: No results for input(s): VITAMINB12, FOLATE, FERRITIN, TIBC, IRON, RETICCTPCT in the last 72 hours. Urine analysis:    Component Value Date/Time   COLORURINE YELLOW 10/08/2015 0905   APPEARANCEUR CLOUDY* 10/08/2015 0905   LABSPEC 1.021 10/08/2015 0905   PHURINE 5.0 10/08/2015 0905   GLUCOSEU NEGATIVE 10/08/2015 0905   HGBUR LARGE* 10/08/2015 0905   BILIRUBINUR NEGATIVE 10/08/2015 0905   KETONESUR NEGATIVE 10/08/2015 0905   PROTEINUR >300* 10/08/2015 0905   NITRITE NEGATIVE 10/08/2015 0905   LEUKOCYTESUR NEGATIVE 10/08/2015 0905   Sepsis Labs: @LABRCNTIP (procalcitonin:4,lacticidven:4)  ) Recent Results (from the past 240 hour(s))  Rapid strep screen     Status: Abnormal   Collection Time: 10/08/15   8:20 AM  Result Value Ref Range Status   Streptococcus, Group A Screen (Direct) POSITIVE (A) NEGATIVE Final         Radiology Studies: [place]  10/08/2015  CLINICAL DATA:  Acute renal failure. Hematuria today. Weakness, fever and fatigue. Strep pharyngitis for the past week. EXAM: RENAL / URINARY TRACT ULTRASOUND COMPLETE COMPARISON:  06/14/2012. FINDINGS: Right Kidney: Length: 11.1 cm. Markedly echogenic with marked progression. No hydronephrosis, mass or calculi seen. Left Kidney: Length: 11.1 cm. Markedly echogenic with marked progression. Poorly visualized collecting system without gross hydronephrosis. No mass or calculi seen. Bladder: Appears normal for degree of bladder distention. Additional finding:  Small right pleural effusion. IMPRESSION: 1. Interval markedly increased echogenicity of both kidneys compatible with severe medical renal disease. 2. No definite hydronephrosis. 3. Small right pleural effusion. Electronically Signed   By: Beckie SaltsSteven  Reid M.D.   On: 10/08/2015 16:56        Scheduled Meds: . amLODipine  10 mg Oral Daily  . amoxicillin  500 mg Oral Q12H  . carvedilol  6.25 mg Oral BID WC  . cloNIDine  0.2 mg Oral TID  . heparin  5,000 Units Subcutaneous Q8H   Continuous Infusions:    LOS: 1 day  Time spent: 7930 Minutes    Berton MountSylvester Jarelle Ates, MD  [facility] Pager #: [phone] 7PM-7AM contact night coverage as above

## 2015-10-09 NOTE — Progress Notes (Signed)
Subjective: Interval History: has no complaint .  Objective: Vital signs in last 24 hours: Temp:  [97.5 F (36.4 C)-98.2 F (36.8 C)] 98.2 F (36.8 C) (07/04 0400) Pulse Rate:  [65-99] 96 (07/04 0830) Resp:  [17-20] 20 (07/04 0400) BP: (178-223)/(122-138) 203/122 mmHg (07/04 0830) SpO2:  [97 %-100 %] 97 % (07/04 0400) Weight:  [109.77 kg (242 lb)] 109.77 kg (242 lb) (07/03 2043) Weight change:   Intake/Output from previous day: 07/03 0701 - 07/04 0700 In: 360 [P.O.:360] Out: 500 [Urine:500] Intake/Output this shift:    General appearance: alert, cooperative and moderately obese Resp: clear to auscultation bilaterally Cardio: S1, S2 normal and systolic murmur: holosystolic 2/6, blowing and LV lift at apex GI: obese, pos bs, liver down 5 cm Extremities: edema 2+  Lab Results:  Recent Labs  10/08/15 0920 10/09/15 0424  WBC 17.1* 14.1*  HGB 13.5 13.1  HCT 38.4* 38.1*  PLT 120* 121*   BMET:  Recent Labs  10/08/15 0920 10/09/15 0424  NA 137 137  K 3.4* 3.2*  CL 104 106  CO2 22 20*  GLUCOSE 97 113*  BUN 57* 56*  CREATININE 7.17* 7.34*  CALCIUM 8.6* 8.7*   No results for input(s): PTH in the last 72 hours. Iron Studies: No results for input(s): IRON, TIBC, TRANSFERRIN, FERRITIN in the last 72 hours.  Studies/Results: [facility]  10/08/2015  CLINICAL DATA:  Acute renal failure. Hematuria today. Weakness, fever and fatigue. Strep pharyngitis for the past week. EXAM: RENAL / URINARY TRACT ULTRASOUND COMPLETE COMPARISON:  06/14/2012. FINDINGS: Right Kidney: Length: 11.1 cm. Markedly echogenic with marked progression. No hydronephrosis, mass or calculi seen. Left Kidney: Length: 11.1 cm. Markedly echogenic with marked progression. Poorly visualized collecting system without gross hydronephrosis. No mass or calculi seen. Bladder: Appears normal for degree of bladder distention. Additional finding:  Small right pleural effusion. IMPRESSION: 1. Interval markedly increased  echogenicity of both kidneys compatible with severe medical renal disease. 2. No definite hydronephrosis. 3. Small right pleural effusion. Electronically Signed   By: Beckie SaltsSteven  Reid M.D.   On: 10/08/2015 16:56    I have reviewed the patient's current medications.  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 HTN , but normal size.  Picture most c/w malig HTN.  However if can get bp controlled, consider bx. Serologies neg thus far. Needs Hep and HIV 2 HTN ^ meds, add diuretic 3 low ptlt mild c/w malig HTN 4 Strep throat P ^ carvedilol, add Lasix, serologies.    LOS: 1 day   Joseph Villegas L 10/09/2015,10:06 AM

## 2015-10-10 LAB — HEPATITIS C ANTIBODY (REFLEX)

## 2015-10-10 LAB — RENAL FUNCTION PANEL
ALBUMIN: 2.8 g/dL — AB (ref 3.5–5.0)
ANION GAP: 13 (ref 5–15)
BUN: 59 mg/dL — AB (ref 6–20)
CALCIUM: 9.2 mg/dL (ref 8.9–10.3)
CO2: 21 mmol/L — AB (ref 22–32)
Chloride: 105 mmol/L (ref 101–111)
Creatinine, Ser: 7.86 mg/dL — ABNORMAL HIGH (ref 0.61–1.24)
GFR calc Af Amer: 10 mL/min — ABNORMAL LOW (ref >60)
GFR calc non Af Amer: 8 mL/min — ABNORMAL LOW (ref >60)
GLUCOSE: 91 mg/dL (ref 65–99)
PHOSPHORUS: 5 mg/dL — AB (ref 2.5–4.6)
Potassium: 3.4 mmol/L — ABNORMAL LOW (ref 3.5–5.1)
SODIUM: 139 mmol/L (ref 135–145)

## 2015-10-10 LAB — HEPATITIS PANEL, ACUTE
HCV Ab: <0.1 s/co ratio (ref 0.0–0.9)
Hep A IgM: NEGATIVE
Hep B C IgM: NEGATIVE
Hepatitis B Surface Ag: NEGATIVE

## 2015-10-10 LAB — PROTEIN ELECTROPHORESIS, SERUM
A/G Ratio: 0.7 (ref 0.7–1.7)
ALPHA-2-GLOBULIN: 0.7 g/dL (ref 0.4–1.0)
Albumin ELP: 2.7 g/dL — ABNORMAL LOW (ref 2.9–4.4)
Alpha-1-Globulin: 0.4 g/dL (ref 0.0–0.4)
BETA GLOBULIN: 1.1 g/dL (ref 0.7–1.3)
GLOBULIN, TOTAL: 3.7 g/dL (ref 2.2–3.9)
Gamma Globulin: 1.5 g/dL (ref 0.4–1.8)
Total Protein ELP: 6.4 g/dL (ref 6.0–8.5)

## 2015-10-10 LAB — CBC
HCT: 37.6 % — ABNORMAL LOW (ref 39.0–52.0)
HEMOGLOBIN: 12.6 g/dL — AB (ref 13.0–17.0)
MCH: 28.5 pg (ref 26.0–34.0)
MCHC: 33.5 g/dL (ref 30.0–36.0)
MCV: 85.1 fL (ref 78.0–100.0)
Platelets: 118 10*3/uL — ABNORMAL LOW (ref 150–400)
RBC: 4.42 MIL/uL (ref 4.22–5.81)
RDW: 14.1 % (ref 11.5–15.5)
WBC: 10.4 10*3/uL (ref 4.0–10.5)

## 2015-10-10 LAB — HCV COMMENT:

## 2015-10-10 LAB — ANTI-DNA ANTIBODY, DOUBLE-STRANDED

## 2015-10-10 LAB — HIV ANTIBODY (ROUTINE TESTING W REFLEX): HIV SCREEN 4TH GENERATION: NONREACTIVE

## 2015-10-10 LAB — ANTINUCLEAR ANTIBODIES, IFA: ANTINUCLEAR ANTIBODIES, IFA: NEGATIVE

## 2015-10-10 LAB — HEPATITIS B SURFACE ANTIBODY,QUALITATIVE: Hep B S Ab: NONREACTIVE

## 2015-10-10 LAB — GLOMERULAR BASEMENT MEMBRANE ANTIBODIES: GBM Ab: 6 units (ref 0–20)

## 2015-10-10 MED ORDER — HYDRALAZINE HCL 25 MG PO TABS
25.0000 mg | ORAL_TABLET | Freq: Three times a day (TID) | ORAL | Status: DC
  Administered 2015-10-10 – 2015-10-12 (×6): 25 mg via ORAL
  Filled 2015-10-10 (×6): qty 1

## 2015-10-10 MED ORDER — SPIRONOLACTONE 25 MG PO TABS
25.0000 mg | ORAL_TABLET | Freq: Every day | ORAL | Status: DC
  Administered 2015-10-10 – 2015-10-20 (×10): 25 mg via ORAL
  Filled 2015-10-10 (×11): qty 1

## 2015-10-10 NOTE — Progress Notes (Signed)
10/10/2015 12:44 PM  BP recheck after administration of scheduled BP meds this am stills hows a high BP of 194/127.  Administered hydralazine per prn order.  Will recheck BP shortly.  Theadora RamaKIRKMAN, Oniel Meleski Brooke

## 2015-10-10 NOTE — Progress Notes (Signed)
Patient ID: Joseph Villegas, male   DOB: 09-Jul-1990, 25 y.o.   MRN: [id number]    PROGRESS NOTE    Joseph Villegas  WUJ:[id number] DOB: 09-Jul-1990 DOA: 10/08/2015  PCP: No PCP Per Patient   Brief Narrative:  25 y.o. year-old with HTN, 1 week history of fever, cough, sore throat, weakness. His evaluation there was significant for hypertension (204/152), leukocytosis to 17,000, mild thrombocytopenia of 120K, creatinine of 7.17 and baseline creatinine not known. Urinalysis revealed >300 protein, large blood, TNTC RBC's. Rapid strep test +. Nephrology consulted.    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 Need for renal biopsy  Hypertensive urgency - with renal insufficiency and suggestion of TMA - BP still high on lasix 160 BID, norvasc 10 Daily, Coreg 25 BID, clonidine 0.2 TID, spironolactone 25mg  PO QD - added Hydralazine as well  - ? hyperaldo in the setting of hypoK but not clear if low K due to lasix as well  - may not be unreasonable to pursue work up if BP uncontrolled on current medical regimen   Strep throat  - continue amoxicillin  Hypokalemia - supplement, BMP in AM  Thrombocytopenia - mild, monitor - CBC In AM  Obesity  - Body mass index is 34.48 kg/(m^2).  DVT prophylaxis: Heparin SQ Code Status: Full  Family Communication: Patient at bedside  Disposition Plan: Home once nephrology team clears   Consultants:   Nephrology   Procedures:   None  Antimicrobials:   Amoxicillin   Subjective: Reports feeling better, no chest pain.   Objective: Filed Vitals:   10/09/15 2317 10/10/15 0430 10/10/15 0857 10/10/15 1153  BP: 182/111 195/131 203/119 194/127  Pulse: 91 90 97 91  Temp:  99.4 F (37.4 C) 97.6 F (36.4 C)   TempSrc:   Oral   Resp:  22 20   Height:      Weight:      SpO2:  100% 100%     Intake/Output Summary  (Last 24 hours) at 10/10/15 1203 Last data filed at 10/10/15 0900  Gross per 24 hour  Intake   1060 ml  Output    650 ml  Net    410 ml   Filed Weights   10/08/15 0819 10/08/15 2043 10/09/15 2105  Weight: 108.863 kg (240 lb) 109.77 kg (242 lb) 109 kg (240 lb 4.8 oz)    Examination:  General exam: Appears calm and comfortable  Respiratory system: Clear to auscultation. Respiratory effort normal. Cardiovascular system: S1 & S2 heard, RRR. No JVD, murmurs, rubs, gallops or clicks.  Gastrointestinal system: Abdomen is nondistended, soft and nontender. No organomegaly or masses felt.  Central nervous system: Alert and oriented. No focal neurological deficits. Extremities: Symmetric 5 x 5 power.  Data Reviewed: I have personally reviewed following labs and imaging studies  CBC:  Recent Labs Lab 10/08/15 0920 10/09/15 0424 10/10/15 0605  WBC 17.1* 14.1* 10.4  NEUTROABS 14.9*  --   --   HGB 13.5 13.1 12.6*  HCT 38.4* 38.1* 37.6*  MCV 83.1 84.7 85.1  PLT 120* 121* 118*   Basic Metabolic Panel:  Recent Labs Lab 10/08/15 0920 10/09/15 0424 10/10/15 0605  NA 137 137 139  K 3.4* 3.2* 3.4*  CL 104 106 105  CO2 22 20* 21*  GLUCOSE 97 113* 91  BUN 57* 56* 59*  CREATININE 7.17* 7.34*  7.86*  CALCIUM 8.6* 8.7* 9.2  PHOS  --  4.6 5.0*   Liver Function Tests:  Recent Labs Lab 10/09/15 0424 10/10/15 0605  AST 18  --   ALT 24  --   ALKPHOS 63  --   BILITOT 0.5  --   PROT 7.4  --   ALBUMIN 2.8* 2.8*   Coagulation Profile:  Recent Labs Lab 10/09/15 1023  INR 1.16   Urine analysis:    Component Value Date/Time   COLORURINE YELLOW 10/08/2015 0905   APPEARANCEUR CLOUDY* 10/08/2015 0905   LABSPEC 1.021 10/08/2015 0905   PHURINE 5.0 10/08/2015 0905   GLUCOSEU NEGATIVE 10/08/2015 0905   HGBUR LARGE* 10/08/2015 0905   BILIRUBINUR NEGATIVE 10/08/2015 0905   KETONESUR NEGATIVE 10/08/2015 0905   PROTEINUR >300* 10/08/2015 0905   NITRITE NEGATIVE 10/08/2015 0905    LEUKOCYTESUR NEGATIVE 10/08/2015 0905   Recent Results (from the past 240 hour(s))  Rapid strep screen     Status: Abnormal   Collection Time: 10/08/15  8:20 AM  Result Value Ref Range Status   Streptococcus, Group A Screen (Direct) POSITIVE (A) NEGATIVE Final    Radiology Studies: [place] 10/08/2015  Interval markedly increased echogenicity of both kidneys compatible with severe medical renal disease. 2. No definite hydronephrosis. 3. Small right pleural effusion.   Scheduled Meds: . amLODipine  10 mg Oral Daily  . amoxicillin  500 mg Oral Q12H  . carvedilol  25 mg Oral BID WC  . cloNIDine  0.2 mg Oral TID  . furosemide  160 mg Oral BID  . heparin  5,000 Units Subcutaneous Q8H  . hydrALAZINE  25 mg Oral Q8H  . spironolactone  25 mg Oral Daily   Continuous Infusions:    LOS: 2 days    Time spent: 20 minutes    Debbora PrestoMAGICK-Murrel Freet, MD [facility] Pager [phone]  If 7PM-7AM, please contact night-coverage [email or web address] Password [email or web address] 10/10/2015, 12:03 PM

## 2015-10-10 NOTE — Progress Notes (Signed)
Patient ID: Joseph Villegas, male   DOB: 10-Oct-1990, 25 y.o.   MRN: [id number] [facility] Progress Note   Assessment/ Plan:   1. 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 Serologies negative so far for acute GN. Would favor kidney biopsy (diagnostic/prognostic) after BP controlled 2. Hypertensive urgency: with renal insufficiency and suggestion of TMA. Blood pressures remain elevated on lasix 160 BID, norvasc 10 Daily, Coreg 25 BID, clonidine 0.2 TID---will add spironolactone 25mg  PO Q day 3. Hypokalemia: due to pressure natriuresis/ loop diuretic - start spironolactone 4. CKD-MBD: phosphorus acceptable at 5.0- await PTH  Subjective:   Reports to be feeling fair--anxious about prognosis   Objective:   BP 203/119 mmHg  Pulse 97  Temp(Src) 97.6 F (36.4 C) (Oral)  Resp 20  Ht 5\' 10"  (1.778 m)  Wt 109 kg (240 lb 4.8 oz)  BMI 34.48 kg/m2  SpO2 100%  Intake/Output Summary (Last 24 hours) at 10/10/15 0940 Last data filed at 10/10/15 0900  Gross per 24 hour  Intake   1060 ml  Output    650 ml  Net    410 ml   Weight change: 0.137 kg (4.8 oz)  Physical Exam: WUJ:[id number]:comfortably resting in bed, oriented X 3 AOZ:HYQMVCVS:Pulse regular, S1 and S2 normal, no gallop/rub Resp: clear bilaterally, no rales/rhonchi HQI:ONGEAbd:soft, obese, NT, BS normal Ext:1-2+ LE edema  Imaging: [facility]  10/08/2015  CLINICAL DATA:  Acute renal failure. Hematuria today. Weakness, fever and fatigue. Strep pharyngitis for the past week. EXAM: RENAL / URINARY TRACT ULTRASOUND COMPLETE COMPARISON:  06/14/2012. FINDINGS: Right Kidney: Length: 11.1 cm. Markedly echogenic with marked progression. No hydronephrosis, mass or calculi seen. Left Kidney: Length: 11.1 cm. Markedly echogenic with marked progression. Poorly visualized collecting system without gross hydronephrosis. No mass or calculi seen. Bladder: Appears normal for degree of bladder distention. Additional finding:   Small right pleural effusion. IMPRESSION: 1. Interval markedly increased echogenicity of both kidneys compatible with severe medical renal disease. 2. No definite hydronephrosis. 3. Small right pleural effusion. Electronically Signed   By: Beckie SaltsSteven  Reid M.D.   On: 10/08/2015 16:56    Labs: BMET  Recent Labs Lab 10/08/15 0920 10/09/15 0424 10/10/15 0605  NA 137 137 139  K 3.4* 3.2* 3.4*  CL 104 106 105  CO2 22 20* 21*  GLUCOSE 97 113* 91  BUN 57* 56* 59*  CREATININE 7.17* 7.34* 7.86*  CALCIUM 8.6* 8.7* 9.2  PHOS  --  4.6 5.0*   CBC  Recent Labs Lab 10/08/15 0920 10/09/15 0424 10/10/15 0605  WBC 17.1* 14.1* 10.4  NEUTROABS 14.9*  --   --   HGB 13.5 13.1 12.6*  HCT 38.4* 38.1* 37.6*  MCV 83.1 84.7 85.1  PLT 120* 121* 118*   Medications:    . amLODipine  10 mg Oral Daily  . amoxicillin  500 mg Oral Q12H  . carvedilol  25 mg Oral BID WC  . cloNIDine  0.2 mg Oral TID  . furosemide  160 mg Oral BID  . heparin  5,000 Units Subcutaneous Q8H   Zetta BillsJay Abdias Hickam, MD 10/10/2015, 9:40 AM

## 2015-10-10 NOTE — Progress Notes (Addendum)
Patient complained of a headache BP 205/130, given tylenol for head ache rated 1/10 and antihypertensives for elevated blood pressure.

## 2015-10-11 LAB — CBC
HCT: 37.3 % — ABNORMAL LOW (ref 39.0–52.0)
Hemoglobin: 12.6 g/dL — ABNORMAL LOW (ref 13.0–17.0)
MCH: 28.8 pg (ref 26.0–34.0)
MCHC: 33.8 g/dL (ref 30.0–36.0)
MCV: 85.4 fL (ref 78.0–100.0)
PLATELETS: 132 10*3/uL — AB (ref 150–400)
RBC: 4.37 MIL/uL (ref 4.22–5.81)
RDW: 14.1 % (ref 11.5–15.5)
WBC: 10.9 10*3/uL — ABNORMAL HIGH (ref 4.0–10.5)

## 2015-10-11 LAB — BASIC METABOLIC PANEL
Anion gap: 13 (ref 5–15)
BUN: 63 mg/dL — AB (ref 6–20)
CALCIUM: 9.3 mg/dL (ref 8.9–10.3)
CHLORIDE: 102 mmol/L (ref 101–111)
CO2: 22 mmol/L (ref 22–32)
CREATININE: 8.61 mg/dL — AB (ref 0.61–1.24)
GFR, EST AFRICAN AMERICAN: 9 mL/min — AB (ref >60)
GFR, EST NON AFRICAN AMERICAN: 8 mL/min — AB (ref >60)
Glucose, Bld: 97 mg/dL (ref 65–99)
Potassium: 3.6 mmol/L (ref 3.5–5.1)
SODIUM: 137 mmol/L (ref 135–145)

## 2015-10-11 NOTE — Progress Notes (Signed)
Patient ID: Joseph Villegas, male   DOB: December 12, 1990, 25 y.o.   MRN: [id number]    PROGRESS NOTE    Joseph Villegas  WUJ:[id number] DOB: December 12, 1990 DOA: 10/08/2015  PCP: No PCP Per Patient   Brief Narrative:  25 y.o. year-old with HTN, 1 week history of fever, cough, sore throat, weakness. His evaluation there was significant for hypertension (204/152), leukocytosis to 17,000, mild thrombocytopenia of 120K, creatinine of 7.17 and baseline creatinine not known. Urinalysis revealed >300 protein, large blood, TNTC RBC's. Rapid strep test +. Nephrology consulted.    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 Need for renal biopsy, maybe early next week  Hypertensive urgency - with renal insufficiency and suggestion of TMA - BP still high on lasix 160 BID, norvasc 10 Daily, Coreg 25 BID, clonidine 0.2 TID, spironolactone 25mg  PO QD, hydralazine 25 mg TID - ? hyperaldo in the setting of hypoK but not clear if low K due to lasix as well  - may not be unreasonable to pursue work up if BP uncontrolled on current medical regimen   Strep throat  - continue amoxicillin  Hypokalemia - supplemented and WNL this AM - BMP in AM  Thrombocytopenia - mild, monitor - CBC In AM  Obesity  - Body mass index is 34.48 kg/(m^2).  DVT prophylaxis: Heparin SQ Code Status: Full  Family Communication: Patient at bedside  Disposition Plan: Home once nephrology team clears   Consultants:   Nephrology   Procedures:   None  Antimicrobials:   Amoxicillin   Subjective: Reports feeling better, no chest pain.   Objective: Filed Vitals:   10/10/15 1845 10/10/15 2003 10/11/15 0204 10/11/15 0434  BP: 194/119 187/122 174/116 191/119  Pulse: 97 87  92  Temp: 98 F (36.7 C) 99.1 F (37.3 C)  98 F (36.7 C)  TempSrc: Oral     Resp: 18 22  20   Height:      Weight:   119 kg (262 lb 5.6 oz)    SpO2: 100% 97%  95%    Intake/Output Summary (Last 24 hours) at 10/11/15 0644 Last data filed at 10/11/15 0600  Gross per 24 hour  Intake    480 ml  Output      0 ml  Net    480 ml   Filed Weights   10/08/15 2043 10/09/15 2105 10/10/15 2003  Weight: 109.77 kg (242 lb) 109 kg (240 lb 4.8 oz) 119 kg (262 lb 5.6 oz)    Examination:  General exam: Appears calm and comfortable  Respiratory system: Clear to auscultation. Respiratory effort normal. Cardiovascular system: S1 & S2 heard, RRR. No JVD, murmurs, rubs, gallops or clicks.  Gastrointestinal system: Abdomen is nondistended, soft and nontender. No organomegaly or masses felt.  Central nervous system: Alert and oriented. No focal neurological deficits. Extremities: Symmetric 5 x 5 power.  Data Reviewed: I have personally reviewed following labs and imaging studies  CBC:  Recent Labs Lab 10/08/15 0920 10/09/15 0424 10/10/15 0605 10/11/15 0530  WBC 17.1* 14.1* 10.4 10.9*  NEUTROABS 14.9*  --   --   --   HGB 13.5 13.1 12.6* 12.6*  HCT 38.4* 38.1* 37.6* 37.3*  MCV 83.1 84.7 85.1 85.4  PLT 120* 121* 118* 132*   Basic Metabolic Panel:  Recent Labs Lab 10/08/15 0920 10/09/15 0424 10/10/15 0605  NA 137 137  139  K 3.4* 3.2* 3.4*  CL 104 106 105  CO2 22 20* 21*  GLUCOSE 97 113* 91  BUN 57* 56* 59*  CREATININE 7.17* 7.34* 7.86*  CALCIUM 8.6* 8.7* 9.2  PHOS  --  4.6 5.0*   Liver Function Tests:  Recent Labs Lab 10/09/15 0424 10/10/15 0605  AST 18  --   ALT 24  --   ALKPHOS 63  --   BILITOT 0.5  --   PROT 7.4  --   ALBUMIN 2.8* 2.8*   Coagulation Profile:  Recent Labs Lab 10/09/15 1023  INR 1.16   Urine analysis:    Component Value Date/Time   COLORURINE YELLOW 10/08/2015 0905   APPEARANCEUR CLOUDY* 10/08/2015 0905   LABSPEC 1.021 10/08/2015 0905   PHURINE 5.0 10/08/2015 0905   GLUCOSEU NEGATIVE 10/08/2015 0905   HGBUR LARGE* 10/08/2015 0905   BILIRUBINUR NEGATIVE  10/08/2015 0905   KETONESUR NEGATIVE 10/08/2015 0905   PROTEINUR >300* 10/08/2015 0905   NITRITE NEGATIVE 10/08/2015 0905   LEUKOCYTESUR NEGATIVE 10/08/2015 0905   Recent Results (from the past 240 hour(s))  Rapid strep screen     Status: Abnormal   Collection Time: 10/08/15  8:20 AM  Result Value Ref Range Status   Streptococcus, Group A Screen (Direct) POSITIVE (A) NEGATIVE Final    Radiology Studies: [place] 10/08/2015  Interval markedly increased echogenicity of both kidneys compatible with severe medical renal disease. 2. No definite hydronephrosis. 3. Small right pleural effusion.   Scheduled Meds: . amLODipine  10 mg Oral Daily  . amoxicillin  500 mg Oral Q12H  . carvedilol  25 mg Oral BID WC  . cloNIDine  0.2 mg Oral TID  . furosemide  160 mg Oral BID  . heparin  5,000 Units Subcutaneous Q8H  . hydrALAZINE  25 mg Oral Q8H  . spironolactone  25 mg Oral Daily   Continuous Infusions:    LOS: 3 days    Time spent: 20 minutes    Debbora PrestoMAGICK-MYERS, ISKRA, MD [facility] Pager [phone]  If 7PM-7AM, please contact night-coverage [email or web address] Password [email or web address] 10/11/2015, 6:44 AM

## 2015-10-11 NOTE — Progress Notes (Signed)
Patient ID: Edsel PetrinLaquan Lockhart, male   DOB: 1991/01/25, 25 y.o.   MRN: [id number] [facility] Progress Note   Assessment/ Plan:   1. 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 Serologies negative so far for acute GN. Would favor kidney biopsy (diagnostic/prognostic) after BP controlled- maybe Monday.  Argument can also be made that "horse is out of barn" and all we would find on biopsy is scar 2. Hypertensive urgency: with renal insufficiency and suggestion of TMA. Blood pressures remain elevated on lasix 160 BID, norvasc 10 Daily, Coreg 25 BID, clonidine 0.2 TID, spironolactone 25mg  PO Q day as well as hydralazine 25 q 8- would not change things at this time- will wait for meds to kick in 3. Hypokalemia: due to pressure natriuresis/ loop diuretic - start spironolactone - better 4. CKD-MBD: phosphorus acceptable at 5.0- await PTH 5. Anemia- not an issue- argues against CKD ?  Subjective:   Reports to be feeling fair--anxious about prognosis.  Blood pressure is still very high on 6 drugs- no UOP recorded but he says he is making good urine- not uremic   Objective:   BP 190/120 mmHg  Pulse 79  Temp(Src) 97.7 F (36.5 C) (Oral)  Resp 20  Ht 5\' 10"  (1.778 m)  Wt 119 kg (262 lb 5.6 oz)  BMI 37.64 kg/m2  SpO2 100%  Intake/Output Summary (Last 24 hours) at 10/11/15 0847 Last data filed at 10/11/15 0600  Gross per 24 hour  Intake    480 ml  Output      0 ml  Net    480 ml   Weight change: 10 kg (22 lb 0.7 oz)  Physical Exam: WUJ:WJXBJYNWGNFGen:comfortably resting in bed, oriented X 3 AOZ:HYQMVCVS:Pulse regular, S1 and S2 normal, no gallop/rub Resp: clear bilaterally, no rales/rhonchi HQI:ONGEAbd:soft, obese, NT, BS normal Ext: minimal LE edema  Imaging: No results found.  Labs: BMET  Recent Labs Lab 10/08/15 0920 10/09/15 0424 10/10/15 0605 10/11/15 0530  NA 137 137 139 137  K 3.4* 3.2* 3.4* 3.6  CL 104 106 105 102  CO2 22 20* 21* 22  GLUCOSE 97  113* 91 97  BUN 57* 56* 59* 63*  CREATININE 7.17* 7.34* 7.86* 8.61*  CALCIUM 8.6* 8.7* 9.2 9.3  PHOS  --  4.6 5.0*  --    CBC  Recent Labs Lab 10/08/15 0920 10/09/15 0424 10/10/15 0605 10/11/15 0530  WBC 17.1* 14.1* 10.4 10.9*  NEUTROABS 14.9*  --   --   --   HGB 13.5 13.1 12.6* 12.6*  HCT 38.4* 38.1* 37.6* 37.3*  MCV 83.1 84.7 85.1 85.4  PLT 120* 121* 118* 132*   Medications:    . amLODipine  10 mg Oral Daily  . amoxicillin  500 mg Oral Q12H  . carvedilol  25 mg Oral BID WC  . cloNIDine  0.2 mg Oral TID  . furosemide  160 mg Oral BID  . heparin  5,000 Units Subcutaneous Q8H  . hydrALAZINE  25 mg Oral Q8H  . spironolactone  25 mg Oral Daily   Kayan Blissett A   10/11/2015, 8:47 AM

## 2015-10-12 LAB — RENAL FUNCTION PANEL
ANION GAP: 12 (ref 5–15)
Albumin: 2.8 g/dL — ABNORMAL LOW (ref 3.5–5.0)
BUN: 69 mg/dL — ABNORMAL HIGH (ref 6–20)
CHLORIDE: 101 mmol/L (ref 101–111)
CO2: 23 mmol/L (ref 22–32)
CREATININE: 9.29 mg/dL — AB (ref 0.61–1.24)
Calcium: 9.4 mg/dL (ref 8.9–10.3)
GFR calc non Af Amer: 7 mL/min — ABNORMAL LOW (ref >60)
GFR, EST AFRICAN AMERICAN: 8 mL/min — AB (ref >60)
Glucose, Bld: 91 mg/dL (ref 65–99)
POTASSIUM: 3.3 mmol/L — AB (ref 3.5–5.1)
Phosphorus: 7.1 mg/dL — ABNORMAL HIGH (ref 2.5–4.6)
Sodium: 136 mmol/L (ref 135–145)

## 2015-10-12 MED ORDER — HYDRALAZINE HCL 50 MG PO TABS
75.0000 mg | ORAL_TABLET | Freq: Three times a day (TID) | ORAL | Status: DC
  Administered 2015-10-12 – 2015-10-13 (×3): 75 mg via ORAL
  Filled 2015-10-12 (×3): qty 1

## 2015-10-12 NOTE — Progress Notes (Signed)
Instructed patient to collect urine for measurement. Advised to use the urinal.

## 2015-10-12 NOTE — Progress Notes (Signed)
Admit: 10/08/2015 LOS: 4  Joseph Villegas admit with SCr of 7(Acute GN vs CKD), hematuria, severe HTN, streptococcal pharyngitis  Subjective:  Eating ok, No N/V. Feels well. No more sore throat  Not much UOP recorded BP improved but not at goal yet Weights up 19lb??!!  07/06 0701 - 07/07 0700 In: 1200 [P.O.:1200] Out: 3 [Urine:3]  Filed Weights   10/09/15 2105 10/10/15 2003 10/11/15 2110  Weight: 109 kg (240 lb 4.8 oz) 119 kg (262 lb 5.6 oz) 117.6 kg (259 lb 4.2 oz)    Scheduled Meds: . amLODipine  10 mg Oral Daily  . amoxicillin  500 mg Oral Q12H  . carvedilol  25 mg Oral BID WC  . cloNIDine  0.2 mg Oral TID  . furosemide  160 mg Oral BID  . heparin  5,000 Units Subcutaneous Q8H  . hydrALAZINE  25 mg Oral Q8H  . spironolactone  25 mg Oral Daily   Continuous Infusions:  PRN Meds:.acetaminophen **OR** acetaminophen, hydrALAZINE, HYDROcodone-acetaminophen, ondansetron **OR** ondansetron (ZOFRAN) IV, polyethylene glycol  Current Labs: reviewed   Physical Exam:  Blood pressure 187/112, pulse 80, temperature 97.8 F (36.6 C), temperature source Oral, resp. rate 18, height 5\' 10"  (1.778 m), weight 117.6 kg (259 lb 4.2 oz), SpO2 97 %. Trace LEE NAD RRR, nl s1s2, no rub CTAB No rashes No effusions  Renal Studies: UA 4+ protein, TNTC RBC, no wbc UP/C 3.9 C3 144, C4 144 ANCA neg PR3 and MPO neg ASO WNL GBM neg HCV neg HIV neg Renal US: markedly echogenic, 11.1 cm b/l  A 1. AKI vs progressive CKD (more and more favoring the latter) 1. GN w/u negative thus far 2. Needs Biopsy, BP remains too high 3. Might need to move towards dialysis with progressive loss of GFR 2. 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 CCB + BB + diuretic + hydralazine + clonidine + MRB  P 1. Increase hydralazine 2. Have IR consulted today, possible Bx on Monday 3. Might need TDC as well 4. Accurate I/Os   Pearson Grippe MD 10/12/2015, 10:40 AM   Recent Labs Lab 10/09/15 0424 10/10/15 0605  10/11/15 0530 10/12/15 0531  NA 137 139 137 136  K 3.2* 3.4* 3.6 3.3*  CL 106 105 102 101  CO2 20* 21* 22 23  GLUCOSE 113* 91 97 91  BUN 56* 59* 63* 69*  CREATININE 7.34* 7.86* 8.61* 9.29*  CALCIUM 8.7* 9.2 9.3 9.4  PHOS 4.6 5.0*  --  7.1*    Recent Labs Lab 10/08/15 0920 10/09/15 0424 10/10/15 0605 10/11/15 0530  WBC 17.1* 14.1* 10.4 10.9*  NEUTROABS 14.9*  --   --   --   HGB 13.5 13.1 12.6* 12.6*  HCT 38.4* 38.1* 37.6* 37.3*  MCV 83.1 84.7 85.1 85.4  PLT 120* 121* 118* 132*

## 2015-10-12 NOTE — Consult Note (Signed)
Chief Complaint: Patient was seen in consultation today for random renal biopsy Chief Complaint  Patient presents with  . Sore Throat  . Hematuria   at the request of Dr Sabra Heckyan Sanford  Referring Physician(s): Dr Sabra Heckyan Sanford  Supervising Physician: Jolaine Villegas, Joseph  Patient Status: Inpatient  History of Present Illness: Joseph Villegas is a 25 y.o. male   Admitted with general weakness Work up revealed leukocytosis Elevated renal functions + strep pharyngitis Severe hypertension  Nephrology has seen and examined pt: Serology neg so far for glomerulonephritis Now on 6 meds for HTN Noted proteinuria; hematuria Request for random renal biopsy Planned for 7/10 Mon---dependent on BP readings   Past Medical History  Diagnosis Date  . Hypertension     Past Surgical History  Procedure Laterality Date  . Pilonidal cyst / sinus excision  2008    Allergies: Review of patient's allergies indicates no known allergies.  Medications: Prior to Admission medications   Medication Sig Start Date End Date Taking? Authorizing Provider  amLODipine (NORVASC) 10 MG tablet Take 10 mg by mouth daily.   Yes Historical Provider, MD  aspirin EC 325 MG tablet Take 325 mg by mouth daily as needed (for headaches).   Yes Historical Provider, MD  cloNIDine (CATAPRES) 0.1 MG tablet Take 0.1 mg by mouth daily.   Yes Historical Provider, MD  furosemide (LASIX) 20 MG tablet Take 20 mg by mouth 2 (two) times daily.   Yes Historical Provider, MD  metoprolol tartrate (LOPRESSOR) 25 MG tablet Take 25 mg by mouth 2 (two) times daily.   Yes Historical Provider, MD     Family History  Problem Relation Age of Onset  . Hypertension Mother   . Diabetes Mother   . Hypertension Brother   . Diabetes Sister   . CAD Father     Social History   Social History  . Marital Status: Single    Spouse Name: N/A  . Number of Children: N/A  . Years of Education: N/A   Social History Main Topics  . Smoking  status: Never Smoker   . Smokeless tobacco: Never Used  . Alcohol Use: No  . Drug Use: No  . Sexual Activity: Not Currently   Other Topics Concern  . None   Social History Narrative  . None    Review of Systems: A 12 point ROS discussed and pertinent positives are indicated in the HPI above.  All other systems are negative.  Review of Systems  Constitutional: Positive for activity change and fatigue. Negative for fever and appetite change.  HENT: Positive for sore throat and trouble swallowing.   Respiratory: Negative for cough and shortness of breath.   Neurological: Positive for weakness.  Psychiatric/Behavioral: Negative for behavioral problems and confusion.    Vital Signs: BP 187/112 mmHg  Pulse 80  Temp(Src) 97.8 F (36.6 C) (Oral)  Resp 18  Ht 5\' 10"  (1.778 m)  Wt 259 lb 4.2 oz (117.6 kg)  BMI 37.20 kg/m2  SpO2 97%  Physical Exam  Constitutional: He is oriented to person, place, and time.  Cardiovascular: Normal rate, regular rhythm and normal heart sounds.   Pulmonary/Chest: Effort normal and breath sounds normal. He has no wheezes.  Abdominal: Soft. Bowel sounds are normal. There is no tenderness.  Musculoskeletal: Normal range of motion.  Neurological: He is alert and oriented to person, place, and time.  Skin: Skin is warm and dry.  Psychiatric: He has a normal mood and affect. His behavior is  normal. Judgment and thought content normal.  Nursing note and vitals reviewed.   Mallampati Score:  MD Evaluation Airway: WNL Heart: WNL Abdomen: WNL Chest/ Lungs: WNL ASA  Classification: 3 Mallampati/Airway Score: One  Imaging: [facility]  10/08/2015  CLINICAL DATA:  Acute renal failure. Hematuria today. Weakness, fever and fatigue. Strep pharyngitis for the past week. EXAM: RENAL / URINARY TRACT ULTRASOUND COMPLETE COMPARISON:  06/14/2012. FINDINGS: Right Kidney: Length: 11.1 cm. Markedly echogenic with marked progression. No hydronephrosis, mass or calculi  seen. Left Kidney: Length: 11.1 cm. Markedly echogenic with marked progression. Poorly visualized collecting system without gross hydronephrosis. No mass or calculi seen. Bladder: Appears normal for degree of bladder distention. Additional finding:  Small right pleural effusion. IMPRESSION: 1. Interval markedly increased echogenicity of both kidneys compatible with severe medical renal disease. 2. No definite hydronephrosis. 3. Small right pleural effusion. Electronically Signed   By: Beckie SaltsSteven  Reid M.D.   On: 10/08/2015 16:56    Labs:  CBC:  Recent Labs  10/08/15 0920 10/09/15 0424 10/10/15 0605 10/11/15 0530  WBC 17.1* 14.1* 10.4 10.9*  HGB 13.5 13.1 12.6* 12.6*  HCT 38.4* 38.1* 37.6* 37.3*  PLT 120* 121* 118* 132*    COAGS:  Recent Labs  10/09/15 1023  INR 1.16  APTT 34    BMP:  Recent Labs  10/09/15 0424 10/10/15 0605 10/11/15 0530 10/12/15 0531  NA 137 139 137 136  K 3.2* 3.4* 3.6 3.3*  CL 106 105 102 101  CO2 20* 21* 22 23  GLUCOSE 113* 91 97 91  BUN 56* 59* 63* 69*  CALCIUM 8.7* 9.2 9.3 9.4  CREATININE 7.34* 7.86* 8.61* 9.29*  GFRNONAA 9* 8* 8* 7*  GFRAA 11* 10* 9* 8*    LIVER FUNCTION TESTS:  Recent Labs  10/09/15 0424 10/10/15 0605 10/12/15 0531  BILITOT 0.5  --   --   AST 18  --   --   ALT 24  --   --   ALKPHOS 63  --   --   PROT 7.4  --   --   ALBUMIN 2.8* 2.8* 2.8*    TUMOR MARKERS: No results for input(s): AFPTM, CEA, CA199, CHROMGRNA in the last 8760 hours.  Assessment and Plan:  HTN Proteinuria;hematuria Bun/Cr elevation CKD V Scheduled for random renal biopsy 7/10 Has eaten today; received Hep inj today; BP too high for bx Risks and Benefits discussed with the patient including, but not limited to bleeding, infection, damage to adjacent structures or low yield requiring additional tests. All of the patient's questions were answered, patient is agreeable to proceed. Consent signed and in chart.   Thank you for this  interesting consult.  I greatly enjoyed meeting Joseph Villegas and look forward to participating in their care.  A copy of this report was sent to the requesting provider on this date.  Electronically Signed: Ralene MuskratURPIN,Nadav Swindell A 10/12/2015, 11:17 AM   I spent a total of 40 Minutes    in face to face in clinical consultation, greater than 50% of which was counseling/coordinating care for random renal bx

## 2015-10-12 NOTE — Progress Notes (Signed)
Patient ID: Joseph Villegas, male   DOB: 01-Apr-1991, 25 y.o.   MRN: [id number]    PROGRESS NOTE    Joseph Villegas  YNW:[id number] DOB: 01-Apr-1991 DOA: 10/08/2015  PCP: No PCP Per Patient   Brief Narrative:  25 y.o. year-old with HTN, 1 week history of fever, cough, sore throat, weakness. His evaluation there was significant for hypertension (204/152), leukocytosis to 17,000, mild thrombocytopenia of 120K, creatinine of 7.17 and baseline creatinine not known. Urinalysis revealed >300 protein, large blood, TNTC RBC's. Rapid strep test +. Nephrology consulted.    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 Need for renal biopsy, maybe early next week - also may need HD  Hypertensive urgency - with renal insufficiency and suggestion of TMA - BP still high on lasix 160 BID, norvasc 10 Daily, Coreg 25 BID, clonidine 0.2 TID, spironolactone 25mg  PO QD, hydralazine 25 mg TID - continue to monitor on current regimen, further rec's per nephrology team   Strep throat  - continue amoxicillin  Hypokalemia - still low, supplement  - BMP in AM  Thrombocytopenia - mild, monitor - no signs of bleeding  - CBC In AM  Obesity  - Body mass index is 34.48 kg/(m^2).  DVT prophylaxis: Heparin SQ Code Status: Full  Family Communication: Patient at bedside  Disposition Plan: Home once nephrology team clears   Consultants:   Nephrology   Procedures:   None  Antimicrobials:   Amoxicillin   Subjective: Reports feeling better, no chest pain. Wants to go home.   Objective: Filed Vitals:   10/11/15 2110 10/12/15 0141 10/12/15 0441 10/12/15 0935  BP: 170/105 163/106 178/106 187/112  Pulse: 78 73 87 80  Temp: 97.4 F (36.3 C)  97.7 F (36.5 C) 97.8 F (36.6 C)  TempSrc: Oral  Oral Oral  Resp: 18  16 18   Height: 5\' 10"  (1.778 m)     Weight: 117.6 kg (259  lb 4.2 oz)     SpO2: 97%  100% 97%    Intake/Output Summary (Last 24 hours) at 10/12/15 1117 Last data filed at 10/12/15 0600  Gross per 24 hour  Intake    900 ml  Output      3 ml  Net    897 ml   Filed Weights   10/09/15 2105 10/10/15 2003 10/11/15 2110  Weight: 109 kg (240 lb 4.8 oz) 119 kg (262 lb 5.6 oz) 117.6 kg (259 lb 4.2 oz)    Examination:  General exam: Appears calm and comfortable  Respiratory system: Clear to auscultation. Respiratory effort normal. Cardiovascular system: S1 & S2 heard, RRR. No JVD, murmurs, rubs, gallops or clicks.  Gastrointestinal system: Abdomen is nondistended, soft and nontender. No organomegaly or masses felt.  Central nervous system: Alert and oriented. No focal neurological deficits. Extremities: Symmetric 5 x 5 power.  Data Reviewed: I have personally reviewed following labs and imaging studies  CBC:  Recent Labs Lab 10/08/15 0920 10/09/15 0424 10/10/15 0605 10/11/15 0530  WBC 17.1* 14.1* 10.4 10.9*  NEUTROABS 14.9*  --   --   --   HGB 13.5 13.1 12.6* 12.6*  HCT 38.4* 38.1* 37.6* 37.3*  MCV 83.1 84.7 85.1 85.4  PLT 120* 121* 118* 132*   Basic Metabolic Panel:  Recent Labs Lab 10/08/15 0920 10/09/15 0424 10/10/15 0605 10/11/15 0530 10/12/15 0531  NA 137 137 139  137 136  K 3.4* 3.2* 3.4* 3.6 3.3*  CL 104 106 105 102 101  CO2 22 20* 21* 22 23  GLUCOSE 97 113* 91 97 91  BUN 57* 56* 59* 63* 69*  CREATININE 7.17* 7.34* 7.86* 8.61* 9.29*  CALCIUM 8.6* 8.7* 9.2 9.3 9.4  PHOS  --  4.6 5.0*  --  7.1*   Liver Function Tests:  Recent Labs Lab 10/09/15 0424 10/10/15 0605 10/12/15 0531  AST 18  --   --   ALT 24  --   --   ALKPHOS 63  --   --   BILITOT 0.5  --   --   PROT 7.4  --   --   ALBUMIN 2.8* 2.8* 2.8*   Coagulation Profile:  Recent Labs Lab 10/09/15 1023  INR 1.16   Urine analysis:    Component Value Date/Time   COLORURINE YELLOW 10/08/2015 0905   APPEARANCEUR CLOUDY* 10/08/2015 0905   LABSPEC  1.021 10/08/2015 0905   PHURINE 5.0 10/08/2015 0905   GLUCOSEU NEGATIVE 10/08/2015 0905   HGBUR LARGE* 10/08/2015 0905   BILIRUBINUR NEGATIVE 10/08/2015 0905   KETONESUR NEGATIVE 10/08/2015 0905   PROTEINUR >300* 10/08/2015 0905   NITRITE NEGATIVE 10/08/2015 0905   LEUKOCYTESUR NEGATIVE 10/08/2015 0905   Recent Results (from the past 240 hour(s))  Rapid strep screen     Status: Abnormal   Collection Time: 10/08/15  8:20 AM  Result Value Ref Range Status   Streptococcus, Group A Screen (Direct) POSITIVE (A) NEGATIVE Final    Radiology Studies: [facility] 10/08/2015  Interval markedly increased echogenicity of both kidneys compatible with severe medical renal disease. 2. No definite hydronephrosis. 3. Small right pleural effusion.   Scheduled Meds: . amLODipine  10 mg Oral Daily  . amoxicillin  500 mg Oral Q12H  . carvedilol  25 mg Oral BID WC  . cloNIDine  0.2 mg Oral TID  . furosemide  160 mg Oral BID  . heparin  5,000 Units Subcutaneous Q8H  . hydrALAZINE  75 mg Oral Q8H  . spironolactone  25 mg Oral Daily   Continuous Infusions:    LOS: 4 days    Time spent: 20 minutes    Debbora PrestoMAGICK-Terrie Haring, MD [facility] Pager [phone]  If 7PM-7AM, please contact night-coverage [email or web address] Password [email or web address] 10/12/2015, 11:17 AM

## 2015-10-13 LAB — RENAL FUNCTION PANEL
ANION GAP: 14 (ref 5–15)
Albumin: 3.1 g/dL — ABNORMAL LOW (ref 3.5–5.0)
BUN: 78 mg/dL — ABNORMAL HIGH (ref 6–20)
CO2: 22 mmol/L (ref 22–32)
Calcium: 9.2 mg/dL (ref 8.9–10.3)
Chloride: 98 mmol/L — ABNORMAL LOW (ref 101–111)
Creatinine, Ser: 10.11 mg/dL — ABNORMAL HIGH (ref 0.61–1.24)
GFR calc Af Amer: 7 mL/min — ABNORMAL LOW (ref >60)
GFR calc non Af Amer: 6 mL/min — ABNORMAL LOW (ref >60)
GLUCOSE: 85 mg/dL (ref 65–99)
POTASSIUM: 3.5 mmol/L (ref 3.5–5.1)
Phosphorus: 7.1 mg/dL — ABNORMAL HIGH (ref 2.5–4.6)
Sodium: 134 mmol/L — ABNORMAL LOW (ref 135–145)

## 2015-10-13 MED ORDER — DOXAZOSIN MESYLATE 2 MG PO TABS
4.0000 mg | ORAL_TABLET | Freq: Every day | ORAL | Status: DC
  Administered 2015-10-13 – 2015-10-15 (×3): 4 mg via ORAL
  Filled 2015-10-13 (×3): qty 2

## 2015-10-13 MED ORDER — HEPARIN SODIUM (PORCINE) 1000 UNIT/ML IJ SOLN
5000.0000 [IU] | Freq: Three times a day (TID) | INTRAMUSCULAR | Status: DC
  Filled 2015-10-13 (×2): qty 5

## 2015-10-13 MED ORDER — HYDRALAZINE HCL 50 MG PO TABS
100.0000 mg | ORAL_TABLET | Freq: Three times a day (TID) | ORAL | Status: DC
  Administered 2015-10-13 – 2015-10-15 (×8): 100 mg via ORAL
  Filled 2015-10-13 (×10): qty 2

## 2015-10-13 NOTE — Progress Notes (Signed)
Re-checked BP 195/106 left arm after 20 mg IV hydralazine.  Paged Dr. Audrea MuscatMyers FYI

## 2015-10-13 NOTE — Progress Notes (Signed)
Paged Dr. Izola PriceMyers FYI bp 198/122 gave IV hydralazine 20 mg.

## 2015-10-13 NOTE — Progress Notes (Signed)
BP remains elevated.  Tentatively planning for biopsy on Monday, but BP will have to be around 150s/90s before we are able to proceed.  Primary and renal are working on BP.  Will follow.  NPO p MN on Sunday and hold heparin Sunday night and Monday in case we are able to proceed.  Fleming Prill E

## 2015-10-13 NOTE — Progress Notes (Signed)
Admit: 10/08/2015 LOS: 5  26M admit with SCr of 7(Acute GN vs progressive CKD), hematuria, severe HTN, streptococcal pharyngitis  Subjective:  Eating ok, No N/V. Feels well. No more sore throat  Still feels fine BP remains up after inc hydralazine No AM labs yet  07/07 0701 - 07/08 0700 In: 840 [P.O.:840] Out: 1 [Urine:1]  Filed Weights   10/10/15 2003 10/11/15 2110 10/12/15 2015  Weight: 119 kg (262 lb 5.6 oz) 117.6 kg (259 lb 4.2 oz) 117.3 kg (258 lb 9.6 oz)    Scheduled Meds: . amLODipine  10 mg Oral Daily  . amoxicillin  500 mg Oral Q12H  . carvedilol  25 mg Oral BID WC  . cloNIDine  0.2 mg Oral TID  . doxazosin  4 mg Oral QHS  . furosemide  160 mg Oral BID  . heparin  5,000 Units Subcutaneous Q8H  . hydrALAZINE  100 mg Oral Q8H  . spironolactone  25 mg Oral Daily   Continuous Infusions:  PRN Meds:.acetaminophen **OR** acetaminophen, hydrALAZINE, HYDROcodone-acetaminophen, ondansetron **OR** ondansetron (ZOFRAN) IV, polyethylene glycol  Current Labs: reviewed   Physical Exam:  Blood pressure 172/122, pulse 71, temperature 97.9 F (36.6 C), temperature source Oral, resp. rate 18, height 5\' 10"  (1.778 m), weight 117.3 kg (258 lb 9.6 oz), SpO2 99 %. Trace LEE NAD RRR, nl s1s2, no rub CTAB No rashes No effusions No asterixus  Renal Studies: UA 4+ protein, TNTC RBC, no wbc UP/C 3.9 C3 144, C4 40 ANCA neg PR3 and MPO neg ASO WNL GBM neg HCV neg HIV neg Renal US: markedly echogenic, 11.1 cm b/l  A 1. AKI vs progressive CKD (more and more favoring the latter) 1. GN w/u negative thus far 2. Needs Biopsy, BP remains too high -- IR tentative planning 7/10 3. Might need to move towards dialysis with progressive loss of GFR 2. 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 CCB + BB + diuretic + hydralazine + clonidine + MRB  P 1. Increase hydralazine again today to 100mg  TID 2. Start doxazosin 4mg  qHS 3. Have IR consulted today, possible Bx on Monday 4. Might need  TDC as well 5. Accurate I/Os 6. Await AM labs 7. No HD needs, no overt uremia   Pearson Grippe MD 10/13/2015, 10:01 AM   Recent Labs Lab 10/09/15 0424 10/10/15 0605 10/11/15 0530 10/12/15 0531  NA 137 139 137 136  K 3.2* 3.4* 3.6 3.3*  CL 106 105 102 101  CO2 20* 21* 22 23  GLUCOSE 113* 91 97 91  BUN 56* 59* 63* 69*  CREATININE 7.34* 7.86* 8.61* 9.29*  CALCIUM 8.7* 9.2 9.3 9.4  PHOS 4.6 5.0*  --  7.1*    Recent Labs Lab 10/08/15 0920 10/09/15 0424 10/10/15 0605 10/11/15 0530  WBC 17.1* 14.1* 10.4 10.9*  NEUTROABS 14.9*  --   --   --   HGB 13.5 13.1 12.6* 12.6*  HCT 38.4* 38.1* 37.6* 37.3*  MCV 83.1 84.7 85.1 85.4  PLT 120* 121* 118* 132*

## 2015-10-13 NOTE — Progress Notes (Signed)
Patient ID: Joseph Villegas, male   DOB: 10-06-1990, 25 y.o.   MRN: [id number]    PROGRESS NOTE    Joseph Villegas  YQM:[id number] DOB: 10-06-1990 DOA: 10/08/2015  PCP: No PCP Per Patient   Brief Narrative:  25 y.o. year-old with HTN, 1 week history of fever, cough, sore throat, weakness. His evaluation there was significant for hypertension (204/152), leukocytosis to 17,000, mild thrombocytopenia of 120K, creatinine of 7.17 and baseline creatinine not known. Urinalysis revealed >300 protein, large blood, TNTC RBC's. Rapid strep test +. Nephrology consulted.    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 - renal U/S suggests advanced CKD from HTN - nephrology team consulted for assistance, appreciate help  - renal biopsy for July 10th, 2017 - BMP in AM  Hypertensive urgency - with renal insufficiency and suggestion of TMA - BP still high on lasix 160 BID, norvasc 10 Daily, Coreg 25 BID, clonidine 0.2 TID, spironolactone 25mg  PO QD, hydralazine 25 mg TID - continue to monitor on current regimen, further rec's per nephrology team   Strep throat  - continue amoxicillin  Hypokalemia - BMP pending today   Thrombocytopenia - mild, monitor - no signs of bleeding  - CBC In AM  Obesity  - Body mass index is 34.48 kg/(m^2).  DVT prophylaxis: Heparin SQ Code Status: Full  Family Communication: Patient at bedside  Disposition Plan: Home once nephrology team clears   Consultants:   Nephrology   Procedures:   None  Antimicrobials:   Amoxicillin   Subjective: Reports feeling better, no chest pain. Wants to go home.   Objective: Filed Vitals:   10/12/15 1341 10/12/15 2015 10/13/15 0418 10/13/15 0844  BP: 168/111 166/100 188/106 172/122  Pulse: 72 73 75 71  Temp:  97.7 F (36.5 C) 97.7 F (36.5 C) 97.9 F (36.6 C)  TempSrc:  Oral Oral Oral  Resp:  19 19 18   Height:      Weight:  117.3 kg (258 lb 9.6 oz)    SpO2:  98% 95% 99%     Intake/Output Summary (Last 24 hours) at 10/13/15 1148 Last data filed at 10/13/15 0847  Gross per 24 hour  Intake    960 ml  Output      1 ml  Net    959 ml   Filed Weights   10/10/15 2003 10/11/15 2110 10/12/15 2015  Weight: 119 kg (262 lb 5.6 oz) 117.6 kg (259 lb 4.2 oz) 117.3 kg (258 lb 9.6 oz)    Examination:  General exam: Appears calm and comfortable  Respiratory system: Clear to auscultation. Respiratory effort normal. Cardiovascular system: S1 & S2 heard, RRR. No JVD, murmurs, rubs, gallops or clicks.  Gastrointestinal system: Abdomen is nondistended, soft and nontender. No organomegaly or masses felt.  Central nervous system: Alert and oriented. No focal neurological deficits. Extremities: Symmetric 5 x 5 power.  Data Reviewed: I have personally reviewed following labs and imaging studies  CBC:  Recent Labs Lab 10/08/15 0920 10/09/15 0424 10/10/15 0605 10/11/15 0530  WBC 17.1* 14.1* 10.4 10.9*  NEUTROABS 14.9*  --   --   --   HGB 13.5 13.1 12.6* 12.6*  HCT 38.4* 38.1* 37.6* 37.3*  MCV 83.1 84.7 85.1 85.4  PLT 120* 121* 118* 132*   Basic Metabolic Panel:  Recent Labs Lab 10/08/15 0920 10/09/15 0424 10/10/15 0605 10/11/15 0530 10/12/15 0531  NA 137 137 139 137 136  K 3.4* 3.2* 3.4* 3.6 3.3*  CL  104 106 105 102 101  CO2 22 20* 21* 22 23  GLUCOSE 97 113* 91 97 91  BUN 57* 56* 59* 63* 69*  CREATININE 7.17* 7.34* 7.86* 8.61* 9.29*  CALCIUM 8.6* 8.7* 9.2 9.3 9.4  PHOS  --  4.6 5.0*  --  7.1*   Liver Function Tests:  Recent Labs Lab 10/09/15 0424 10/10/15 0605 10/12/15 0531  AST 18  --   --   ALT 24  --   --   ALKPHOS 63  --   --   BILITOT 0.5  --   --   PROT 7.4  --   --   ALBUMIN 2.8* 2.8* 2.8*   Coagulation Profile:  Recent Labs Lab 10/09/15 1023  INR 1.16   Urine analysis:    Component Value Date/Time   COLORURINE YELLOW 10/08/2015 0905   APPEARANCEUR CLOUDY* 10/08/2015 0905   LABSPEC 1.021 10/08/2015 0905   PHURINE 5.0  10/08/2015 0905   GLUCOSEU NEGATIVE 10/08/2015 0905   HGBUR LARGE* 10/08/2015 0905   BILIRUBINUR NEGATIVE 10/08/2015 0905   KETONESUR NEGATIVE 10/08/2015 0905   PROTEINUR >300* 10/08/2015 0905   NITRITE NEGATIVE 10/08/2015 0905   LEUKOCYTESUR NEGATIVE 10/08/2015 0905   Recent Results (from the past 240 hour(s))  Rapid strep screen     Status: Abnormal   Collection Time: 10/08/15  8:20 AM  Result Value Ref Range Status   Streptococcus, Group A Screen (Direct) POSITIVE (A) NEGATIVE Final    Radiology Studies: US Renal 10/08/2015  Interval markedly increased echogenicity of both kidneys compatible with severe medical renal disease. 2. No definite hydronephrosis. 3. Small right pleural effusion.   Scheduled Meds: . amLODipine  10 mg Oral Daily  . amoxicillin  500 mg Oral Q12H  . carvedilol  25 mg Oral BID WC  . cloNIDine  0.2 mg Oral TID  . doxazosin  4 mg Oral QHS  . furosemide  160 mg Oral BID  . heparin  5,000 Units Subcutaneous Q8H  . hydrALAZINE  100 mg Oral Q8H  . spironolactone  25 mg Oral Daily   Continuous Infusions:    LOS: 5 days    Time spent: 20 minutes    Debbora Presto, MD [facility] Pager [phone]  If 7PM-7AM, please contact night-coverage [email or web address] Password [email or web address] 10/13/2015, 11:48 AM

## 2015-10-13 NOTE — Progress Notes (Signed)
Pt bp 198/122 gave IV hydralazine 20 mg IV.

## 2015-10-14 LAB — TYPE AND SCREEN
ABO/RH(D): O POS
ANTIBODY SCREEN: NEGATIVE

## 2015-10-14 LAB — PROTIME-INR
INR: 1.15 (ref 0.00–1.49)
Prothrombin Time: 14.9 seconds (ref 11.6–15.2)

## 2015-10-14 LAB — RENAL FUNCTION PANEL
ALBUMIN: 3.4 g/dL — AB (ref 3.5–5.0)
ANION GAP: 16 — AB (ref 5–15)
BUN: 84 mg/dL — ABNORMAL HIGH (ref 6–20)
CO2: 21 mmol/L — AB (ref 22–32)
Calcium: 9.6 mg/dL (ref 8.9–10.3)
Chloride: 97 mmol/L — ABNORMAL LOW (ref 101–111)
Creatinine, Ser: 10.82 mg/dL — ABNORMAL HIGH (ref 0.61–1.24)
GFR calc non Af Amer: 6 mL/min — ABNORMAL LOW (ref >60)
GFR, EST AFRICAN AMERICAN: 7 mL/min — AB (ref >60)
GLUCOSE: 112 mg/dL — AB (ref 65–99)
PHOSPHORUS: 7.1 mg/dL — AB (ref 2.5–4.6)
POTASSIUM: 3.3 mmol/L — AB (ref 3.5–5.1)
Sodium: 134 mmol/L — ABNORMAL LOW (ref 135–145)

## 2015-10-14 LAB — CBC
HCT: 38.8 % — ABNORMAL LOW (ref 39.0–52.0)
Hemoglobin: 13.5 g/dL (ref 13.0–17.0)
MCH: 28.8 pg (ref 26.0–34.0)
MCHC: 34.8 g/dL (ref 30.0–36.0)
MCV: 82.7 fL (ref 78.0–100.0)
PLATELETS: 202 10*3/uL (ref 150–400)
RBC: 4.69 MIL/uL (ref 4.22–5.81)
RDW: 13.6 % (ref 11.5–15.5)
WBC: 11.3 10*3/uL — AB (ref 4.0–10.5)

## 2015-10-14 LAB — ABO/RH: ABO/RH(D): O POS

## 2015-10-14 LAB — APTT: APTT: 33 s (ref 24–37)

## 2015-10-14 MED ORDER — MINOXIDIL 2.5 MG PO TABS
5.0000 mg | ORAL_TABLET | Freq: Two times a day (BID) | ORAL | Status: DC
  Administered 2015-10-14 – 2015-10-17 (×7): 5 mg via ORAL
  Filled 2015-10-14 (×9): qty 2

## 2015-10-14 NOTE — Progress Notes (Signed)
Pt's BP 179/102 at 2121, RN administered 2200 BP medications and rechecked BP at 2247, BP 181/113. RN administered PRN hydralazine 20mg . Recheck of BP 175/106 at 0059. MD notified. RN awaiting orders. RN will continue to monitor patient.   Veatrice KellsMahmoud,Rajah Lamba I, RN

## 2015-10-14 NOTE — Progress Notes (Signed)
Patient's BP 177/119. PRN IV hydralazine administered. Will inform oncoming RN to recheck BP.  Veatrice KellsMahmoud,Lizeth Bencosme I, RN

## 2015-10-14 NOTE — Progress Notes (Signed)
Patient ID: Joseph PetrinLaquan Villegas, male   DOB: 1990-08-12, 25 y.o.   MRN: [id number] Request for tunneled HD cath noted.  Patient seen and consented.  Will likely try to put in at the same time as renal biopsy; however, if BP remains high, may be able to proceed at least with HD cath tomorrow.  Joseph Villegas E 8:14 AM 10/14/2015

## 2015-10-14 NOTE — Progress Notes (Signed)
Admit: 10/08/2015 LOS: 6  48M admit with SCr of 7(Acute GN vs progressive CKD), hematuria, severe HTN, streptococcal pharyngitis  Subjective:  BP Still up; add doxazosin and inc hydralazine yesterday Good UOP Labs worsened yesterday, AM labs pending Discussed with patient he will likely need to start dialysis, pt appropriately upset with this Discussed HD logistics, big picture of transplant, uncertaintly if acute/chronic Pt c/o some dizzyness with standing  07/08 0701 - 07/09 0700 In: 782 [P.O.:782] Out: 2250 [Urine:2250]  Filed Weights   10/11/15 2110 10/12/15 2015 10/13/15 2121  Weight: 117.6 kg (259 lb 4.2 oz) 117.3 kg (258 lb 9.6 oz) 115.4 kg (254 lb 6.6 oz)    Scheduled Meds: . amLODipine  10 mg Oral Daily  . amoxicillin  500 mg Oral Q12H  . carvedilol  25 mg Oral BID WC  . cloNIDine  0.2 mg Oral TID  . doxazosin  4 mg Oral QHS  . furosemide  160 mg Oral BID  . heparin  5,000 Units Subcutaneous Q8H  . [START ON 10/16/2015] heparin  5,000 Units Intravenous TID  . hydrALAZINE  100 mg Oral Q8H  . minoxidil  5 mg Oral BID  . spironolactone  25 mg Oral Daily   Continuous Infusions:  PRN Meds:.acetaminophen **OR** acetaminophen, hydrALAZINE, HYDROcodone-acetaminophen, ondansetron **OR** ondansetron (ZOFRAN) IV, polyethylene glycol  Current Labs: reviewed   Physical Exam:  Blood pressure 177/119, pulse 93, temperature 97.8 F (36.6 C), temperature source Oral, resp. rate 16, height 5\' 10"  (1.778 m), weight 115.4 kg (254 lb 6.6 oz), SpO2 91 %. Trace LEE NAD RRR, nl s1s2, no rub CTAB No rashes No effusions No asterixus  Renal Studies: UA 4+ protein, TNTC RBC, no wbc UP/C 3.9 C3 144, C4 40 ANCA neg PR3 and MPO neg ASO WNL GBM neg HCV neg HIV neg Renal US: markedly echogenic, 11.1 cm b/l  A 1. AKI vs progressive CKD (more and more favoring the latter) 1. GN w/u negative thus far 2. Needs Biopsy, BP remains too high -- IR tentative planning 7/10 3. Might need  to move towards dialysis with progressive loss of GFR 2. 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 CCB + BB + diuretic + hydralazine + clonidine + MRB + alpha blocker  P 1. Begin minoxidil 2. Have IR consulted , possible Bx on Monday; Biopsy Form in Paper Chart 3. Will need TDC as well; ordered -- if can't do renal biopsy should still do TDC 4. HD might be best bet for BP control 5. Accurate I/Os; orthostatics this AM 6. Await AM labs 7. Tentative HD in next day or two if not improving  Pearson Grippe MD 10/14/2015, 8:08 AM   Recent Labs Lab 10/10/15 0605 10/11/15 0530 10/12/15 0531 10/13/15 1114  NA 139 137 136 134*  K 3.4* 3.6 3.3* 3.5  CL 105 102 101 98*  CO2 21* 22 23 22   GLUCOSE 91 97 91 85  BUN 59* 63* 69* 78*  CREATININE 7.86* 8.61* 9.29* 10.11*  CALCIUM 9.2 9.3 9.4 9.2  PHOS 5.0*  --  7.1* 7.1*    Recent Labs Lab 10/08/15 0920 10/09/15 0424 10/10/15 0605 10/11/15 0530  WBC 17.1* 14.1* 10.4 10.9*  NEUTROABS 14.9*  --   --   --   HGB 13.5 13.1 12.6* 12.6*  HCT 38.4* 38.1* 37.6* 37.3*  MCV 83.1 84.7 85.1 85.4  PLT 120* 121* 118* 132*

## 2015-10-14 NOTE — Progress Notes (Signed)
Patient ID: Joseph Villegas, male   DOB: [birth year], 25 y.o.   MRN: [id number]    PROGRESS NOTE    Joseph Villegas  [id number] DOB: [birth year] DOA: 10/08/2015  PCP: No PCP Per Patient   Brief Narrative:  25 y.o. year-old with HTN, 1 week history of fever, cough, sore throat, weakness. His evaluation there was significant for hypertension (204/152), leukocytosis to 17,000, mild thrombocytopenia of 120K, creatinine of 7.17 and baseline creatinine not known. Urinalysis revealed >300 protein, large blood, TNTC RBC's. Rapid strep test +. Nephrology consulted.    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 Initiation of HD   Strep throat  - continue amoxicillin, discontinue today   Hypokalemia - WNL but on low end of normal  - BMP in AM  Thrombocytopenia - mild, monitor - no signs of bleeding  - CBC In AM  Obesity  - Body mass index is 34.48 kg/(m^2).  DVT prophylaxis: Heparin SQ Code Status: Full  Family Communication: Patient at bedside  Disposition Plan: Home once nephrology team clears   Consultants:   Nephrology   Procedures:   None  Antimicrobials:   Amoxicillin   Subjective: Reports feeling better, no chest pain.   Objective: Filed Vitals:   10/13/15 2247 10/14/15 0059 10/14/15 0527 10/14/15 0633  BP: 181/113 175/106 181/102 177/119  Pulse: 93 89 92 93  Temp:   97.8 F (36.6 C)   TempSrc: Oral  Oral   Resp:   16   Height:      Weight:      SpO2:   91%     Intake/Output Summary (Last 24 hours) at 10/14/15 0731 Last data filed at  10/14/15 0600  Gross per 24 hour  Intake    782 ml  Output   2250 ml  Net  -1468 ml   Filed Weights   10/11/15 2110 10/12/15 2015 10/13/15 2121  Weight: 117.6 kg (259 lb 4.2 oz) 117.3 kg (258 lb 9.6 oz) 115.4 kg (254 lb 6.6 oz)    Examination:  General exam: Appears calm and comfortable  Respiratory system: Clear to auscultation. Respiratory effort normal. Cardiovascular system: S1 & S2 heard, RRR. No JVD, murmurs, rubs, gallops or clicks.  Gastrointestinal system: Abdomen is nondistended, soft and nontender. No organomegaly or masses felt.  Central nervous system: Alert and oriented. No focal neurological deficits. Extremities: Symmetric 5 x 5 power.  Data Reviewed: I have personally reviewed following labs and imaging studies  CBC:  Recent Labs Lab 10/08/15 0920 10/09/15 0424 10/10/15 0605 10/11/15 0530  WBC 17.1* 14.1* 10.4 10.9*  NEUTROABS 14.9*  --   --   --   HGB 13.5 13.1 12.6* 12.6*  HCT 38.4* 38.1* 37.6* 37.3*  MCV 83.1 84.7 85.1 85.4  PLT 120* 121* 118* 132*   Basic Metabolic Panel:  Recent Labs Lab 10/09/15 0424 10/10/15 0605 10/11/15 0530 10/12/15 0531 10/13/15 1114  NA 137 139 137 136 134*  K 3.2* 3.4* 3.6 3.3* 3.5  CL 106 105 102 101 98*  CO2 20* 21* 22 23 22  GLUCOSE 113* 91 97 91 85  BUN 56* 59* 63* 69* 78*  CREATININE 7.34* 7.86* 8.61* 9.29* 10.11*  CALCIUM 8.7* 9.2 9.3 9.4 9.2  PHOS 4.6 5.0*  --  7.1* 7.1*   Liver Function Tests:  Recent Labs Lab 10/09/15 0424 10/10/15 0605 10/12/15 0531 10/13/15 1114  AST 18  --   --   --   ALT 24  --   --   --   ALKPHOS 63  --   --   --   BILITOT 0.5  --   --   --   PROT 7.4  --   --   --   ALBUMIN 2.8* 2.8* 2.8* 3.1*   Coagulation Profile:  Recent Labs Lab 10/09/15 1023  INR 1.16   Urine analysis:    Component Value Date/Time   COLORURINE YELLOW 10/08/2015 0905   APPEARANCEUR CLOUDY* 10/08/2015 0905   LABSPEC 1.021 10/08/2015 0905   PHURINE 5.0 10/08/2015 0905   GLUCOSEU  NEGATIVE 10/08/2015 0905   HGBUR LARGE* 10/08/2015 0905   BILIRUBINUR NEGATIVE 10/08/2015 0905   KETONESUR NEGATIVE 10/08/2015 0905   PROTEINUR >300* 10/08/2015 0905   NITRITE NEGATIVE 10/08/2015 0905   LEUKOCYTESUR NEGATIVE 10/08/2015 0905   Recent Results (from the past 240 hour(s))  Rapid strep screen     Status: Abnormal   Collection Time: 10/08/15  8:20 AM  Result Value Ref Range Status   Streptococcus, Group A Screen (Direct) POSITIVE (A) NEGATIVE Final    Radiology Studies: US Renal 10/08/2015  Interval markedly increased echogenicity of both kidneys compatible with severe medical renal disease. 2. No definite hydronephrosis. 3. Small right pleural effusion.   Scheduled Meds: . amLODipine  10 mg Oral Daily  . amoxicillin  500 mg Oral Q12H  . carvedilol  25 mg Oral BID WC  . cloNIDine  0.2 mg Oral TID  . doxazosin  4 mg Oral QHS  . furosemide  160 mg Oral BID  . heparin  5,000 Units Subcutaneous Q8H  . [START ON 10/16/2015] heparin  5,000 Units Intravenous TID  . hydrALAZINE  100 mg Oral Q8H  . spironolactone  25 mg Oral Daily   Continuous Infusions:    LOS: 6 days    Time spent: 20 minutes    Debbora Presto, MD [facility] Pager [phone]  If 7PM-7AM, please contact night-coverage [email or web address] Password [email or web address] 10/14/2015, 7:31 AM

## 2015-10-15 ENCOUNTER — Inpatient Hospital Stay (HOSPITAL_COMMUNITY): Payer: Medicaid Other

## 2015-10-15 LAB — CBC
HCT: 36.4 % — ABNORMAL LOW (ref 39.0–52.0)
HEMATOCRIT: 35.8 % — AB (ref 39.0–52.0)
HEMOGLOBIN: 12.5 g/dL — AB (ref 13.0–17.0)
HEMOGLOBIN: 12.4 g/dL — AB (ref 13.0–17.0)
MCH: 28.6 pg (ref 26.0–34.0)
MCH: 28.9 pg (ref 26.0–34.0)
MCHC: 34.3 g/dL (ref 30.0–36.0)
MCHC: 34.6 g/dL (ref 30.0–36.0)
MCV: 83.3 fL (ref 78.0–100.0)
MCV: 83.4 fL (ref 78.0–100.0)
PLATELETS: 195 10*3/uL (ref 150–400)
Platelets: 218 10*3/uL (ref 150–400)
RBC: 4.37 MIL/uL (ref 4.22–5.81)
RBC: 4.29 MIL/uL (ref 4.22–5.81)
RDW: 13.4 % (ref 11.5–15.5)
RDW: 13.6 % (ref 11.5–15.5)
WBC: 10.3 10*3/uL (ref 4.0–10.5)
WBC: 9.1 10*3/uL (ref 4.0–10.5)

## 2015-10-15 LAB — BASIC METABOLIC PANEL
Anion gap: 16 — ABNORMAL HIGH (ref 5–15)
BUN: 93 mg/dL — ABNORMAL HIGH (ref 6–20)
CHLORIDE: 94 mmol/L — AB (ref 101–111)
CO2: 22 mmol/L (ref 22–32)
CREATININE: 12.09 mg/dL — AB (ref 0.61–1.24)
Calcium: 9.7 mg/dL (ref 8.9–10.3)
GFR calc non Af Amer: 5 mL/min — ABNORMAL LOW (ref >60)
GFR, EST AFRICAN AMERICAN: 6 mL/min — AB (ref >60)
Glucose, Bld: 107 mg/dL — ABNORMAL HIGH (ref 65–99)
Potassium: 3.6 mmol/L (ref 3.5–5.1)
Sodium: 132 mmol/L — ABNORMAL LOW (ref 135–145)

## 2015-10-15 MED ORDER — MIDAZOLAM HCL 2 MG/2ML IJ SOLN
INTRAMUSCULAR | Status: AC | PRN
  Administered 2015-10-15: 2 mg via INTRAVENOUS

## 2015-10-15 MED ORDER — HEPARIN SODIUM (PORCINE) 1000 UNIT/ML IJ SOLN
INTRAMUSCULAR | Status: AC
  Administered 2015-10-15: 3200 [IU]
  Filled 2015-10-15: qty 1

## 2015-10-15 MED ORDER — CEFAZOLIN SODIUM-DEXTROSE 2-4 GM/100ML-% IV SOLN
2.0000 g | INTRAVENOUS | Status: AC
  Filled 2015-10-15: qty 100

## 2015-10-15 MED ORDER — MIDAZOLAM HCL 2 MG/2ML IJ SOLN
INTRAMUSCULAR | Status: AC
  Administered 2015-10-15: 09:00:00
  Filled 2015-10-15: qty 4

## 2015-10-15 MED ORDER — CEFAZOLIN SODIUM-DEXTROSE 2-4 GM/100ML-% IV SOLN
INTRAVENOUS | Status: AC
  Filled 2015-10-15: qty 100

## 2015-10-15 MED ORDER — SODIUM CHLORIDE 0.9 % IJ SOLN
INTRAMUSCULAR | Status: AC
  Filled 2015-10-15: qty 10

## 2015-10-15 MED ORDER — CEFAZOLIN SODIUM-DEXTROSE 2-4 GM/100ML-% IV SOLN
2.0000 g | Freq: Once | INTRAVENOUS | Status: AC
  Administered 2015-10-15: 2 g via INTRAVENOUS
  Filled 2015-10-15: qty 100

## 2015-10-15 MED ORDER — FENTANYL CITRATE (PF) 100 MCG/2ML IJ SOLN
INTRAMUSCULAR | Status: AC | PRN
  Administered 2015-10-15 (×2): 50 ug via INTRAVENOUS

## 2015-10-15 MED ORDER — FENTANYL CITRATE (PF) 100 MCG/2ML IJ SOLN
INTRAMUSCULAR | Status: AC | PRN
  Administered 2015-10-15: 50 ug via INTRAVENOUS

## 2015-10-15 MED ORDER — LIDOCAINE HCL 1 % IJ SOLN
INTRAMUSCULAR | Status: AC
Start: 2015-10-15 — End: 2015-10-15
  Administered 2015-10-15: 8 mL
  Filled 2015-10-15: qty 20

## 2015-10-15 MED ORDER — MIDAZOLAM HCL 2 MG/2ML IJ SOLN
INTRAMUSCULAR | Status: AC | PRN
  Administered 2015-10-15: 1 mg via INTRAVENOUS

## 2015-10-15 MED ORDER — FENTANYL CITRATE (PF) 100 MCG/2ML IJ SOLN
INTRAMUSCULAR | Status: AC
  Filled 2015-10-15: qty 4

## 2015-10-15 NOTE — Sedation Documentation (Signed)
Patient is resting comfortably. 

## 2015-10-15 NOTE — Sedation Documentation (Signed)
Patient denies pain and is resting comfortably.  

## 2015-10-15 NOTE — Progress Notes (Signed)
Admit: 10/08/2015 LOS: 7  72M admit with SCr of 7(Acute GN vs progressive CKD), hematuria, severe HTN, streptococcal pharyngitis  Subjective:  Some orthstatic changes yesterday BP improved finally, minoxidil seems to have helped Renal biopsy and Tunneled R IJ HD cath this AM Good UOP; SCr worsened  07/09 0701 - 07/10 0700 In: 720 [P.O.:720] Out: 3400 [Urine:3400]  Filed Weights   10/12/15 2015 10/13/15 2121 10/14/15 2003  Weight: 117.3 kg (258 lb 9.6 oz) 115.4 kg (254 lb 6.6 oz) 111.9 kg (246 lb 11.1 oz)    Scheduled Meds: . amLODipine  10 mg Oral Daily  . carvedilol  25 mg Oral BID WC  . ceFAZolin      . cloNIDine  0.2 mg Oral TID  . doxazosin  4 mg Oral QHS  . fentaNYL      . furosemide  160 mg Oral BID  . [START ON 10/16/2015] heparin  5,000 Units Intravenous TID  . hydrALAZINE  100 mg Oral Q8H  . midazolam      . minoxidil  5 mg Oral BID  . sodium chloride      . spironolactone  25 mg Oral Daily   Continuous Infusions:  PRN Meds:.acetaminophen **OR** acetaminophen, fentaNYL, hydrALAZINE, HYDROcodone-acetaminophen, midazolam, ondansetron **OR** ondansetron (ZOFRAN) IV, polyethylene glycol  Current Labs: reviewed   Physical Exam:  Blood pressure 127/65, pulse 96, temperature 97.8 F (36.6 C), temperature source Oral, resp. rate 15, height 5\' 10"  (1.778 m), weight 111.9 kg (246 lb 11.1 oz), SpO2 99 %. Trace LEE NAD RRR, nl s1s2, no rub CTAB No rashes No effusions No asterixus  Renal Studies: UA 4+ protein, TNTC RBC, no wbc UP/C 3.9 C3 144, C4 40 ANCA neg PR3 and MPO neg ASO WNL GBM neg HCV neg HIV neg Renal US: markedly echogenic, 11.1 cm b/l 7/10 Renal Bx: pending 7/10 IR placed R IJ TDC  A 1. AKI vs progressive CKD (more and more favoring the latter) 1. GN w/u negative thus far 2. S/p bioipsy 10/15/15 3. Tentative plan for HD via [facility] 10/16/15 after lab review 2. 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 CCB + BB + diuretic + hydralazine + clonidine +  MRB + alpha blocker + minoxidil 2. Finally improved  P 1. Appreciate IR assitance 2. CBC this AM 3. Cont  Current BP meds 4. Likely HD start 7/11 5. Accurate I/Os; orthostatics this AM 6. Await AM labs  Pearson Grippe MD 10/15/2015, 10:51 AM   Recent Labs Lab 10/12/15 0531 10/13/15 1114 10/14/15 1115 10/15/15 0617  NA 136 134* 134* 132*  K 3.3* 3.5 3.3* 3.6  CL 101 98* 97* 94*  CO2 23 22 21* 22  GLUCOSE 91 85 112* 107*  BUN 69* 78* 84* 93*  CREATININE 9.29* 10.11* 10.82* 12.09*  CALCIUM 9.4 9.2 9.6 9.7  PHOS 7.1* 7.1* 7.1*  --     Recent Labs Lab 10/11/15 0530 10/14/15 1115 10/15/15 0617  WBC 10.9* 11.3* 10.3  HGB 12.6* 13.5 12.5*  HCT 37.3* 38.8* 36.4*  MCV 85.4 82.7 83.3  PLT 132* 202 195

## 2015-10-15 NOTE — Progress Notes (Signed)
Patient ID: Joseph Villegas, male   DOB: Feb 13, 1991, 25 y.o.   MRN: [id number]    PROGRESS NOTE    Joseph Villegas  [id number] DOB: Feb 13, 1991 DOA: 10/08/2015  PCP: No PCP Per Patient   Brief Narrative:  25 y.o. year-old with HTN, 1 week history of fever, cough, sore throat, weakness. His evaluation there was significant for hypertension (204/152), leukocytosis to 17,000, mild thrombocytopenia of 120K, creatinine of 7.17 and baseline creatinine not known. Urinalysis revealed >300 protein, large blood, TNTC RBC's. Rapid strep test +. Nephrology consulted.    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  DVT prophylaxis: Heparin SQ Code Status: Full  Family Communication: Patient at bedside  Disposition Plan: Home once nephrology team clears   Consultants:   Nephrology   IR  Procedures:   None  Antimicrobials:   Amoxicillin   Subjective: Reports feeling better, no chest pain.   Objective: Filed Vitals:   10/15/15 1038 10/15/15 1043 10/15/15 1045 10/15/15 1050  BP: 106/66 114/74 112/65 127/65  Pulse: 95 97 96 96  Temp:      TempSrc:      Resp: 14 13 14 15   Height:      Weight:      SpO2: 100% 100% 100% 99%    Intake/Output Summary (Last 24 hours) at 10/15/15 1103 Last data filed at 10/15/15 0600  Gross per 24 hour  Intake    480 ml  Output   2900 ml  Net  -2420 ml   Filed Weights   10/12/15 2015 10/13/15  2121 10/14/15 2003  Weight: 117.3 kg (258 lb 9.6 oz) 115.4 kg (254 lb 6.6 oz) 111.9 kg (246 lb 11.1 oz)    Examination:  General exam: Appears calm and comfortable  Respiratory system: Clear to auscultation. Respiratory effort normal. Cardiovascular system: S1 & S2 heard, RRR. No JVD, murmurs, rubs, gallops or clicks.  Gastrointestinal system: Abdomen is nondistended, soft and nontender. No organomegaly or masses felt.  Central nervous system: Alert and oriented. No focal neurological deficits. Extremities: Symmetric 5 x 5 power.  Data Reviewed: I have personally reviewed following labs and imaging studies  CBC:  Recent Labs Lab 10/09/15 0424 10/10/15 0605 10/11/15 0530 10/14/15 1115 10/15/15 0617  WBC 14.1* 10.4 10.9* 11.3* 10.3  HGB 13.1 12.6* 12.6* 13.5 12.5*  HCT 38.1* 37.6* 37.3* 38.8* 36.4*  MCV 84.7 85.1 85.4 82.7 83.3  PLT 121* 118* 132* 202 195   Basic Metabolic Panel:  Recent Labs Lab 10/09/15 0424 10/10/15 0605 10/11/15 0530 10/12/15 0531 10/13/15 1114 10/14/15 1115 10/15/15 0617  NA 137 139 137 136 134* 134* 132*  K 3.2* 3.4* 3.6 3.3* 3.5 3.3* 3.6  CL 106 105 102 101 98* 97* 94*  CO2 20* 21* 22 23 22  21* 22  GLUCOSE 113* 91 97 91 85 112* 107*  BUN 56* 59* 63*  69* 78* 84* 93*  CREATININE 7.34* 7.86* 8.61* 9.29* 10.11* 10.82* 12.09*  CALCIUM 8.7* 9.2 9.3 9.4 9.2 9.6 9.7  PHOS 4.6 5.0*  --  7.1* 7.1* 7.1*  --    Liver Function Tests:  Recent Labs Lab 10/09/15 0424 10/10/15 0605 10/12/15 0531 10/13/15 1114 10/14/15 1115  AST 18  --   --   --   --   ALT 24  --   --   --   --   ALKPHOS 63  --   --   --   --   BILITOT 0.5  --   --   --   --   PROT 7.4  --   --   --   --   ALBUMIN 2.8* 2.8* 2.8* 3.1* 3.4*   Coagulation Profile:  Recent Labs Lab 10/09/15 1023 10/14/15 1115  INR 1.16 1.15   Urine analysis:    Component Value Date/Time   COLORURINE YELLOW 10/08/2015 0905   APPEARANCEUR CLOUDY* 10/08/2015 0905   LABSPEC 1.021 10/08/2015  0905   PHURINE 5.0 10/08/2015 0905   GLUCOSEU NEGATIVE 10/08/2015 0905   HGBUR LARGE* 10/08/2015 0905   BILIRUBINUR NEGATIVE 10/08/2015 0905   KETONESUR NEGATIVE 10/08/2015 0905   PROTEINUR >300* 10/08/2015 0905   NITRITE NEGATIVE 10/08/2015 0905   LEUKOCYTESUR NEGATIVE 10/08/2015 0905   Recent Results (from the past 240 hour(s))  Rapid strep screen     Status: Abnormal   Collection Time: 10/08/15  8:20 AM  Result Value Ref Range Status   Streptococcus, Group A Screen (Direct) POSITIVE (A) NEGATIVE Final    Radiology Studies: US Renal 10/08/2015  Interval markedly increased echogenicity of both kidneys compatible with severe medical renal disease. 2. No definite hydronephrosis. 3. Small right pleural effusion.   Scheduled Meds: . amLODipine  10 mg Oral Daily  . carvedilol  25 mg Oral BID WC  . ceFAZolin      . cloNIDine  0.2 mg Oral TID  . doxazosin  4 mg Oral QHS  . fentaNYL      . furosemide  160 mg Oral BID  . [START ON 10/16/2015] heparin  5,000 Units Intravenous TID  . hydrALAZINE  100 mg Oral Q8H  . midazolam      . minoxidil  5 mg Oral BID  . sodium chloride      . spironolactone  25 mg Oral Daily   Continuous Infusions:    LOS: 7 days    Time spent: 20 minutes    Debbora Presto, MD [facility] Pager [phone]  If 7PM-7AM, please contact night-coverage [email or web address] Password [email or web address] 10/15/2015, 11:03 AM

## 2015-10-15 NOTE — Procedures (Signed)
Interventional Radiology Procedure Note  Procedure:  Tunneled HD catheter; renal core biopsy  Complications:  None  Estimated Blood Loss: < 10 mL  19 cm tip to cuff length tunneled Palindrome catheter placed with tip in RA No PTX.  Random renal core biopsy of lower pole of left kidney performed.  16 G core biopsy x 2.  Jodi MarbleGlenn T. Fredia SorrowYamagata, M.D Pager:  [phone]

## 2015-10-16 LAB — RENAL FUNCTION PANEL
ALBUMIN: 3.3 g/dL — AB (ref 3.5–5.0)
Anion gap: 18 — ABNORMAL HIGH (ref 5–15)
BUN: 110 mg/dL — AB (ref 6–20)
CALCIUM: 9.4 mg/dL (ref 8.9–10.3)
CO2: 22 mmol/L (ref 22–32)
Chloride: 92 mmol/L — ABNORMAL LOW (ref 101–111)
Creatinine, Ser: 14.76 mg/dL — ABNORMAL HIGH (ref 0.61–1.24)
GFR calc Af Amer: 5 mL/min — ABNORMAL LOW (ref >60)
GFR calc non Af Amer: 4 mL/min — ABNORMAL LOW (ref >60)
GLUCOSE: 98 mg/dL (ref 65–99)
PHOSPHORUS: 11.8 mg/dL — AB (ref 2.5–4.6)
Potassium: 3.7 mmol/L (ref 3.5–5.1)
SODIUM: 132 mmol/L — AB (ref 135–145)

## 2015-10-16 LAB — CBC
HCT: 34.7 % — ABNORMAL LOW (ref 39.0–52.0)
Hemoglobin: 12.1 g/dL — ABNORMAL LOW (ref 13.0–17.0)
MCH: 29 pg (ref 26.0–34.0)
MCHC: 34.9 g/dL (ref 30.0–36.0)
MCV: 83.2 fL (ref 78.0–100.0)
Platelets: 233 10*3/uL (ref 150–400)
RBC: 4.17 MIL/uL — ABNORMAL LOW (ref 4.22–5.81)
RDW: 13.5 % (ref 11.5–15.5)
WBC: 10 10*3/uL (ref 4.0–10.5)

## 2015-10-16 MED ORDER — HEPARIN SODIUM (PORCINE) 5000 UNIT/ML IJ SOLN
5000.0000 [IU] | Freq: Three times a day (TID) | INTRAMUSCULAR | Status: DC
  Administered 2015-10-16 – 2015-10-30 (×34): 5000 [IU] via SUBCUTANEOUS
  Filled 2015-10-16 (×31): qty 1

## 2015-10-16 MED ORDER — HEPARIN SODIUM (PORCINE) 1000 UNIT/ML DIALYSIS: 1000.0000 [IU] | INTRAMUSCULAR | Status: DC | PRN

## 2015-10-16 MED ORDER — HYDRALAZINE HCL 50 MG PO TABS
50.0000 mg | ORAL_TABLET | Freq: Three times a day (TID) | ORAL | Status: DC
  Administered 2015-10-16 – 2015-10-18 (×5): 50 mg via ORAL
  Filled 2015-10-16 (×5): qty 1

## 2015-10-16 MED ORDER — LIDOCAINE HCL (PF) 1 % IJ SOLN: 5.0000 mL | INTRAMUSCULAR | Status: DC | PRN

## 2015-10-16 MED ORDER — SODIUM CHLORIDE 0.9 % IV SOLN: 100.0000 mL | INTRAVENOUS | Status: DC | PRN

## 2015-10-16 MED ORDER — LIDOCAINE-PRILOCAINE 2.5-2.5 % EX CREA: 1.0000 "application " | TOPICAL_CREAM | CUTANEOUS | Status: DC | PRN

## 2015-10-16 MED ORDER — PENTAFLUOROPROP-TETRAFLUOROETH EX AERO: 1.0000 "application " | INHALATION_SPRAY | CUTANEOUS | Status: DC | PRN

## 2015-10-16 MED ORDER — ALTEPLASE 2 MG IJ SOLR: 2.0000 mg | Freq: Once | INTRAMUSCULAR | Status: DC | PRN

## 2015-10-16 NOTE — Progress Notes (Signed)
Patient brought to HD unit for first treatment. Consent obtained and treatment initiated. Blood pressures rapidly trending downward. MD at bedside with verbal order to maintain SBP > 115 since patient becomes symptomatic with SBP lower than that. Patient reported feeling dizzy at 1433. BP check revealed SBP of 109. UF turned off and NS bolus of 200 ml given. BP rechecked with SBP of 117. Patient reports feeling better but quickly returned to dizzy and feeling bad again. Rechecked BP again with SBP of 101. NS 100ml given. No improvement noted and BP readings down to 90s systolically. Pt still symptomatic. Paged Dr. Marisue HumbleSanford. Order received to administer 500ml NS bolus and decrease machine temperature to 35. Bolus administered and temp decreased as ordered. SBP after bolus was 99. Paged Dr. Marisue HumbleSanford again. Reviewed steps taken with MD and current status. Verbal order received to discontinue treatment. 50ml NS given to rinse back arterial line. Patient reports feeling a bit better. Final BP check 112/48. Discussed plan to end treatment and monitor BP with patient. Understanding verbalized. Report called to nurse and patient returned to room.

## 2015-10-16 NOTE — Progress Notes (Signed)
Patient ID: Joseph Villegas, male   DOB: 02-Aug-1990, 25 y.o.   MRN: [id number]    PROGRESS NOTE    Joseph Villegas  CVE:[id number] DOB: Aug 21, 1990 DOA: 10/08/2015  PCP: No PCP Per Patient   Brief Narrative:  25 y.o. year-old with HTN, 1 week history of fever, cough, sore throat, weakness. His evaluation there was significant for hypertension (204/152), leukocytosis to 17,000, mild thrombocytopenia of 120K, creatinine of 7.17 and baseline creatinine not known. Urinalysis revealed >300 protein, large blood, TNTC RBC's. Rapid strep test +. Nephrology consulted.    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 - renal U/S suggests advanced CKD from HTN - renal biopsy done July 10th, 2017, results pending  - C3 144, C4 40, ANCA neg PR3 and MPO neg - ASO WNL, GBM neg, HCV neg, HIV neg - plan for HD today   Hypertensive urgency, likely malignant  - with renal insufficiency and suggestion of TMA - was on lasix 160 BID, norvasc 10 Daily, Coreg 25 BID, clonidine 0.2 TID, spironolactone 25mg  PO QD, hydralazine 25 mg TID - BP now low and likely reason why pt feels dizzy this AM  - stopped clonidine, doxazosin, and lowered the dose of Hydralazine  Strep throat  - completed therapy with amoxicillin   Hypokalemia - remains WNL  - BMP in AM  Thrombocytopenia - resolved  - CBC In AM  Obesity  - Body mass index is 34.48 kg/(m^2).  DVT prophylaxis: Heparin SQ Code Status: Full  Family Communication: Patient at bedside, father at bedside  Disposition Plan: Home once nephrology team clears   Consultants:   Nephrology   IR  Procedures:   Renal biopsy by IR 7/10  IR placed R IJ TDC 7/10  Antimicrobials:   Amoxicillin completed   Subjective: Reports feeling dizzy this AM  Objective: Filed Vitals:   10/15/15 1744 10/15/15 2055 10/16/15 0525 10/16/15 0831  BP: 136/59 113/56 100/31 108/43  Pulse: 96 97 81 83  Temp: 97.8 F (36.6 C) 97.9 F (36.6 C) 97.4 F (36.3 C) 97.6 F  (36.4 C)  TempSrc: Oral Oral Oral Oral  Resp: 16 18 19 16   Height:      Weight:  115.4 kg (254 lb 6.6 oz)    SpO2: 98% 97% 99% 98%    Intake/Output Summary (Last 24 hours) at 10/16/15 1030 Last data filed at 10/16/15 0931  Gross per 24 hour  Intake   1260 ml  Output      0 ml  Net   1260 ml   Filed Weights   10/13/15 2121 10/14/15 2003 10/15/15 2055  Weight: 115.4 kg (254 lb 6.6 oz) 111.9 kg (246 lb 11.1 oz) 115.4 kg (254 lb 6.6 oz)    Examination:  General exam: Appears calm and comfortable  Respiratory system: Clear to auscultation. Respiratory effort normal. Cardiovascular system: S1 & S2 heard, RRR. No JVD, murmurs, rubs, gallops or clicks.  Gastrointestinal system: Abdomen is nondistended, soft and nontender. No organomegaly or masses felt.  Central nervous system: Alert and oriented. No focal neurological deficits. Extremities: Symmetric 5 x 5 power.  Data Reviewed: I have personally reviewed following labs and imaging studies  CBC:  Recent Labs Lab 10/11/15 0530 10/14/15 1115 10/15/15 0617 10/15/15 1613 10/16/15 0727  WBC 10.9* 11.3* 10.3 9.1 10.0  HGB 12.6* 13.5 12.5* 12.4* 12.1*  HCT 37.3* 38.8* 36.4* 35.8* 34.7*  MCV 85.4 82.7 83.3 83.4 83.2  PLT 132* 202 195 218 233  Basic Metabolic Panel:  Recent Labs Lab 10/10/15 0605  10/12/15 0531 10/13/15 1114 10/14/15 1115 10/15/15 0617 10/16/15 0727  NA 139  < > 136 134* 134* 132* 132*  K 3.4*  < > 3.3* 3.5 3.3* 3.6 3.7  CL 105  < > 101 98* 97* 94* 92*  CO2 21*  < > 23 22 21* 22 22  GLUCOSE 91  < > 91 85 112* 107* 98  BUN 59*  < > 69* 78* 84* 93* 110*  CREATININE 7.86*  < > 9.29* 10.11* 10.82* 12.09* 14.76*  CALCIUM 9.2  < > 9.4 9.2 9.6 9.7 9.4  PHOS 5.0*  --  7.1* 7.1* 7.1*  --  11.8*  < > = values in this interval not displayed. Liver Function Tests:  Recent Labs Lab 10/10/15 0605 10/12/15 0531 10/13/15 1114 10/14/15 1115 10/16/15 0727  ALBUMIN 2.8* 2.8* 3.1* 3.4* 3.3*   Coagulation  Profile:  Recent Labs Lab 10/14/15 1115  INR 1.15   Urine analysis:    Component Value Date/Time   COLORURINE YELLOW 10/08/2015 0905   APPEARANCEUR CLOUDY* 10/08/2015 0905   LABSPEC 1.021 10/08/2015 0905   PHURINE 5.0 10/08/2015 0905   GLUCOSEU NEGATIVE 10/08/2015 0905   HGBUR LARGE* 10/08/2015 0905   BILIRUBINUR NEGATIVE 10/08/2015 0905   [place] 10/08/2015 0905   PROTEINUR >300* 10/08/2015 0905   NITRITE NEGATIVE 10/08/2015 0905   LEUKOCYTESUR NEGATIVE 10/08/2015 0905   Recent Results (from the past 240 hour(s))  Rapid strep screen     Status: Abnormal   Collection Time: 10/08/15  8:20 AM  Result Value Ref Range Status   Streptococcus, Group A Screen (Direct) POSITIVE (A) NEGATIVE Final    Radiology Studies: US Renal 10/08/2015  Interval markedly increased echogenicity of both kidneys compatible with severe medical renal disease. 2. No definite hydronephrosis. 3. Small right pleural effusion.   Scheduled Meds: . amLODipine  10 mg Oral Daily  . carvedilol  25 mg Oral BID WC  .  ceFAZolin (ANCEF) IV  2 g Intravenous to XRAY  . furosemide  160 mg Oral BID  . heparin  5,000 Units Intravenous TID  . hydrALAZINE  50 mg Oral Q8H  . minoxidil  5 mg Oral BID  . spironolactone  25 mg Oral Daily   Continuous Infusions:    LOS: 8 days   Time spent: 20 minutes   Faye Ramsay, MD [facility] Pager [phone]  If 7PM-7AM, please contact night-coverage [email or web address] Password [email or web address] 10/16/2015, 10:30 AM

## 2015-10-16 NOTE — Progress Notes (Signed)
Admit: 10/08/2015 LOS: 8  43M admit with SCr of 7(Acute GN vs progressive CKD), hematuria, severe HTN, streptococcal pharyngitis  Subjective:  NO new events BP running on low side No gross hematuria Father at bedside, updated To start HD today  07/10 0701 - 07/11 0700 In: 1020 [P.O.:1020] Out: 0   Filed Weights   10/13/15 2121 10/14/15 2003 10/15/15 2055  Weight: 115.4 kg (254 lb 6.6 oz) 111.9 kg (246 lb 11.1 oz) 115.4 kg (254 lb 6.6 oz)    Scheduled Meds: . amLODipine  10 mg Oral Daily  . carvedilol  25 mg Oral BID WC  .  ceFAZolin (ANCEF) IV  2 g Intravenous to XRAY  . furosemide  160 mg Oral BID  . heparin  5,000 Units Intravenous TID  . hydrALAZINE  100 mg Oral Q8H  . minoxidil  5 mg Oral BID  . spironolactone  25 mg Oral Daily   Continuous Infusions:  PRN Meds:.acetaminophen **OR** acetaminophen, hydrALAZINE, HYDROcodone-acetaminophen, ondansetron **OR** ondansetron (ZOFRAN) IV, polyethylene glycol  Current Labs: reviewed   Physical Exam:  Blood pressure 108/43, pulse 83, temperature 97.6 F (36.4 C), temperature source Oral, resp. rate 16, height 5\' 10"  (1.778 m), weight 115.4 kg (254 lb 6.6 oz), SpO2 98 %. Trace LEE NAD RRR, nl s1s2, no rub CTAB No rashes No effusions No asterixus  Renal Studies: UA 4+ protein, TNTC RBC, no wbc UP/C 3.9 C3 144, C4 40 ANCA neg PR3 and MPO neg ASO WNL GBM neg HCV neg HIV neg Renal US: markedly echogenic, 11.1 cm b/l 7/10 Renal Bx: pending 7/10 IR placed R IJ TDC  A 1. AKI vs progressive CKD (more and more favoring the latter) 1. GN w/u negative thus far 2. S/p bioipsy 10/15/15 3. Start HD via [facility] 10/16/15  4. CLIP after knowing Bx results 2. 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 CCB + BB + diuretic + hydralazine + MRB +minoxidil 2. Running on low side 3. Hyperphosphatemia -- folow as on HD  P 1. Stop clonidine, doxazosin 2. Reduce hdyralazine 3. HD today, 2h, 3K, 2L UF, No heparin 4. Await Bx  results  Pearson Grippe MD 10/16/2015, 9:36 AM   Recent Labs Lab 10/13/15 1114 10/14/15 1115 10/15/15 0617 10/16/15 0727  NA 134* 134* 132* 132*  K 3.5 3.3* 3.6 3.7  CL 98* 97* 94* 92*  CO2 22 21* 22 22  GLUCOSE 85 112* 107* 98  BUN 78* 84* 93* 110*  CREATININE 10.11* 10.82* 12.09* 14.76*  CALCIUM 9.2 9.6 9.7 9.4  PHOS 7.1* 7.1*  --  11.8*    Recent Labs Lab 10/15/15 0617 10/15/15 1613 10/16/15 0727  WBC 10.3 9.1 10.0  HGB 12.5* 12.4* 12.1*  HCT 36.4* 35.8* 34.7*  MCV 83.3 83.4 83.2  PLT 195 218 233

## 2015-10-17 LAB — CBC
HEMATOCRIT: 33.6 % — AB (ref 39.0–52.0)
Hemoglobin: 11.8 g/dL — ABNORMAL LOW (ref 13.0–17.0)
MCH: 28.8 pg (ref 26.0–34.0)
MCHC: 35.1 g/dL (ref 30.0–36.0)
MCV: 82 fL (ref 78.0–100.0)
PLATELETS: 272 10*3/uL (ref 150–400)
RBC: 4.1 MIL/uL — AB (ref 4.22–5.81)
RDW: 13.4 % (ref 11.5–15.5)
WBC: 12.1 10*3/uL — AB (ref 4.0–10.5)

## 2015-10-17 LAB — RENAL FUNCTION PANEL
Albumin: 3.3 g/dL — ABNORMAL LOW (ref 3.5–5.0)
Anion gap: 17 — ABNORMAL HIGH (ref 5–15)
BUN: 104 mg/dL — ABNORMAL HIGH (ref 6–20)
CHLORIDE: 93 mmol/L — AB (ref 101–111)
CO2: 23 mmol/L (ref 22–32)
Calcium: 9.4 mg/dL (ref 8.9–10.3)
Creatinine, Ser: 13.47 mg/dL — ABNORMAL HIGH (ref 0.61–1.24)
GFR, EST AFRICAN AMERICAN: 5 mL/min — AB (ref >60)
GFR, EST NON AFRICAN AMERICAN: 4 mL/min — AB (ref >60)
Glucose, Bld: 108 mg/dL — ABNORMAL HIGH (ref 65–99)
PHOSPHORUS: 9.9 mg/dL — AB (ref 2.5–4.6)
POTASSIUM: 3.4 mmol/L — AB (ref 3.5–5.1)
Sodium: 133 mmol/L — ABNORMAL LOW (ref 135–145)

## 2015-10-17 MED ORDER — GI COCKTAIL ~~LOC~~
30.0000 mL | Freq: Four times a day (QID) | ORAL | Status: DC | PRN
  Administered 2015-10-17 – 2015-10-28 (×7): 30 mL via ORAL
  Filled 2015-10-17 (×7): qty 30

## 2015-10-17 NOTE — Progress Notes (Signed)
Patient ID: Joseph Villegas, male   DOB: 1990-11-06, 25 y.o.   MRN: [id number]    PROGRESS NOTE    Joseph Villegas  FAO:[id number] DOB: 1990-11-02 DOA: 10/08/2015  PCP: No PCP Per Patient   Brief Narrative:  25 y.o. year-old with HTN, 1 week history of fever, cough, sore throat, weakness. His evaluation there was significant for hypertension (204/152), leukocytosis to 17,000, mild thrombocytopenia of 120K, creatinine of 7.17 and baseline creatinine not known. Urinalysis revealed >300 protein, large blood, TNTC RBC's. Rapid strep test +. Nephrology consulted.    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 - renal U/S suggests advanced CKD from HTN - renal biopsy done July 10th, 2017, results pending  - C3 144, C4 40, ANCA neg PR3 and MPO neg - ASO WNL, GBM neg, HCV neg, HIV neg - did not tolerate HD well yesterday  Hypertensive urgency, likely malignant  - with renal insufficiency and suggestion of TMA - was on lasix 160 BID, norvasc 10 Daily, Coreg 25 BID, clonidine 0.2 TID, spironolactone 25mg  PO QD, hydralazine 25 mg TID - stopped clonidine, doxazosin, and lowered the dose of Hydralazine - SBP in 160's this AM  Strep throat  - completed therapy with amoxicillin   Hypokalemia - mild, supplement  - BMP in AM  Thrombocytopenia - resolved  - CBC In AM  Obesity  - Body mass index is 34.48 kg/(m^2).  DVT prophylaxis: Heparin SQ Code Status: Full  Family Communication: Patient at bedside, father at bedside  Disposition Plan: Home once nephrology team clears   Consultants:   Nephrology   IR  Procedures:   Renal biopsy by IR 7/10  IR placed R IJ TDC 7/10  Antimicrobials:   Amoxicillin completed   Subjective: Reports feeling dizzy this AM  Objective: Filed Vitals:   10/16/15 1459 10/16/15 1724 10/16/15 2043 10/17/15 0428  BP: 112/48 146/63 156/78 167/89  Pulse: 93 106 111 112  Temp:  98.8 F (37.1 C) 98.6 F (37 C) 99 F (37.2 C)  TempSrc:  Oral      Resp:  18 21 18   Height:      Weight: 116.9 kg (257 lb 11.5 oz)  113.399 kg (250 lb)   SpO2:  98% 95% 94%    Intake/Output Summary (Last 24 hours) at 10/17/15 1012 Last data filed at 10/17/15 0605  Gross per 24 hour  Intake    840 ml  Output   -523 ml  Net   1363 ml   Filed Weights   10/16/15 1358 10/16/15 1459 10/16/15 2043  Weight: 116.3 kg (256 lb 6.3 oz) 116.9 kg (257 lb 11.5 oz) 113.399 kg (250 lb)    Examination:  General exam: Appears calm and comfortable  Respiratory system: Clear to auscultation. Respiratory effort normal. Cardiovascular system: S1 & S2 heard, RRR. No JVD, murmurs, rubs, gallops or clicks.  Gastrointestinal system: Abdomen is nondistended, soft and nontender. No organomegaly or masses felt.  Central nervous system: Alert and oriented. No focal neurological deficits. Extremities: Symmetric 5 x 5 power.  Data Reviewed: I have personally reviewed following labs and imaging studies  CBC:  Recent Labs Lab 10/14/15 1115 10/15/15 0617 10/15/15 1613 10/16/15 0727 10/17/15 0351  WBC 11.3* 10.3 9.1 10.0 12.1*  HGB 13.5 12.5* 12.4* 12.1* 11.8*  HCT 38.8* 36.4* 35.8* 34.7* 33.6*  MCV 82.7 83.3 83.4 83.2 82.0  PLT 202 195 218 233 696   Basic Metabolic Panel:  Recent Labs Lab 10/12/15 0531 10/13/15  1114 10/14/15 1115 10/15/15 0617 10/16/15 0727 10/17/15 0353  NA 136 134* 134* 132* 132* 133*  K 3.3* 3.5 3.3* 3.6 3.7 3.4*  CL 101 98* 97* 94* 92* 93*  CO2 23 22 21* 22 22 23   GLUCOSE 91 85 112* 107* 98 108*  BUN 69* 78* 84* 93* 110* 104*  CREATININE 9.29* 10.11* 10.82* 12.09* 14.76* 13.47*  CALCIUM 9.4 9.2 9.6 9.7 9.4 9.4  PHOS 7.1* 7.1* 7.1*  --  11.8* 9.9*   Liver Function Tests:  Recent Labs Lab 10/12/15 0531 10/13/15 1114 10/14/15 1115 10/16/15 0727 10/17/15 0353  ALBUMIN 2.8* 3.1* 3.4* 3.3* 3.3*   Coagulation Profile:  Recent Labs Lab 10/14/15 1115  INR 1.15   Urine analysis:    Component Value Date/Time   COLORURINE  YELLOW 10/08/2015 0905   APPEARANCEUR CLOUDY* 10/08/2015 0905   LABSPEC 1.021 10/08/2015 0905   PHURINE 5.0 10/08/2015 0905   GLUCOSEU NEGATIVE 10/08/2015 0905   HGBUR LARGE* 10/08/2015 0905   BILIRUBINUR NEGATIVE 10/08/2015 0905   KETONESUR NEGATIVE 10/08/2015 0905   PROTEINUR >300* 10/08/2015 0905   NITRITE NEGATIVE 10/08/2015 0905   LEUKOCYTESUR NEGATIVE 10/08/2015 0905   Recent Results (from the past 240 hour(s))  Rapid strep screen     Status: Abnormal   Collection Time: 10/08/15  8:20 AM  Result Value Ref Range Status   Streptococcus, Group A Screen (Direct) POSITIVE (A) NEGATIVE Final    Radiology Studies: US Renal 10/08/2015  Interval markedly increased echogenicity of both kidneys compatible with severe medical renal disease. 2. No definite hydronephrosis. 3. Small right pleural effusion.   Scheduled Meds: . amLODipine  10 mg Oral Daily  . carvedilol  25 mg Oral BID WC  . furosemide  160 mg Oral BID  . heparin subcutaneous  5,000 Units Subcutaneous Q8H  . hydrALAZINE  50 mg Oral Q8H  . minoxidil  5 mg Oral BID  . spironolactone  25 mg Oral Daily   Continuous Infusions:    LOS: 9 days   Time spent: 20 minutes   Faye Ramsay, MD [facility] Pager [phone]  If 7PM-7AM, please contact night-coverage [email or web address] Password [email or web address] 10/17/2015, 10:12 AM

## 2015-10-17 NOTE — Progress Notes (Signed)
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 amLODipine  10 mg Oral Daily  . carvedilol  25 mg Oral BID WC  . furosemide  160 mg Oral BID  . heparin subcutaneous  5,000 Units Subcutaneous Q8H  . hydrALAZINE  50 mg Oral Q8H  . minoxidil  5 mg Oral BID  . spironolactone  25 mg Oral Daily   Continuous Infusions:  PRN Meds:.sodium chloride, sodium chloride, acetaminophen **OR** acetaminophen, alteplase, heparin, hydrALAZINE, HYDROcodone-acetaminophen, lidocaine (PF), lidocaine-prilocaine, ondansetron **OR** ondansetron (ZOFRAN) IV, pentafluoroprop-tetrafluoroeth, polyethylene glycol  Current Labs: reviewed   Physical Exam:  Blood pressure 167/89, pulse 112, temperature 99 F (37.2 C), temperature source Oral, resp. rate 18, height 5\' 10"  (1.778 m), weight 113.399 kg (250 lb), SpO2 94 %. 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 GN w/u negative 2. Might recover with time + BP control 3. Hold on HD CLIP 2. HTN, malignant 1. Reduced BP meds with some  hypotension 2. Cont at current dosing, BP stable right now 3. Hyperphosphatemia -- folow as on HD  P 1. Hold on HD today, see how labs look and UOP Daily weights, Daily Renal Panel, Strict I/Os, Avoid nephrotoxins (NSAIDs, judicious IV Contrast)   Pearson Grippe MD 10/17/2015, 9:11 AM   Recent Labs Lab 10/14/15 1115 10/15/15 0617 10/16/15 0727 10/17/15 0353  NA 134* 132* 132* 133*  K 3.3* 3.6 3.7 3.4*  CL 97* 94* 92* 93*  CO2 21* 22 22 23   GLUCOSE 112* 107* 98 108*  BUN 84* 93* 110* 104*  CREATININE 10.82* 12.09* 14.76* 13.47*  CALCIUM 9.6 9.7 9.4 9.4  PHOS 7.1*  --  11.8* 9.9*    Recent Labs Lab 10/15/15 1613 10/16/15 0727 10/17/15 0351  WBC 9.1 10.0 12.1*  HGB 12.4* 12.1* 11.8*  HCT 35.8* 34.7* 33.6*  MCV 83.4 83.2 82.0  PLT 218 233 272

## 2015-10-18 DIAGNOSIS — I10 Essential (primary) hypertension: Secondary | ICD-10-CM

## 2015-10-18 DIAGNOSIS — I214 Non-ST elevation (NSTEMI) myocardial infarction: Secondary | ICD-10-CM

## 2015-10-18 LAB — RENAL FUNCTION PANEL
ALBUMIN: 3.5 g/dL (ref 3.5–5.0)
ANION GAP: 20 — AB (ref 5–15)
BUN: 112 mg/dL — AB (ref 6–20)
CALCIUM: 9.5 mg/dL (ref 8.9–10.3)
CO2: 25 mmol/L (ref 22–32)
Chloride: 84 mmol/L — ABNORMAL LOW (ref 101–111)
Creatinine, Ser: 13.29 mg/dL — ABNORMAL HIGH (ref 0.61–1.24)
GFR calc Af Amer: 5 mL/min — ABNORMAL LOW (ref >60)
GFR calc non Af Amer: 4 mL/min — ABNORMAL LOW (ref >60)
GLUCOSE: 121 mg/dL — AB (ref 65–99)
PHOSPHORUS: 10.3 mg/dL — AB (ref 2.5–4.6)
Potassium: 3.1 mmol/L — ABNORMAL LOW (ref 3.5–5.1)
SODIUM: 129 mmol/L — AB (ref 135–145)

## 2015-10-18 LAB — CBC
HEMATOCRIT: 33.6 % — AB (ref 39.0–52.0)
HEMOGLOBIN: 12.1 g/dL — AB (ref 13.0–17.0)
MCH: 29.5 pg (ref 26.0–34.0)
MCHC: 36 g/dL (ref 30.0–36.0)
MCV: 82 fL (ref 78.0–100.0)
Platelets: 349 10*3/uL (ref 150–400)
RBC: 4.1 MIL/uL — AB (ref 4.22–5.81)
RDW: 13.4 % (ref 11.5–15.5)
WBC: 14.2 10*3/uL — ABNORMAL HIGH (ref 4.0–10.5)

## 2015-10-18 LAB — TROPONIN I
TROPONIN I: 0.95 ng/mL — AB (ref <0.03)
Troponin I: 0.98 ng/mL (ref <0.03)

## 2015-10-18 MED ORDER — ASPIRIN 81 MG PO CHEW
324.0000 mg | CHEWABLE_TABLET | Freq: Once | ORAL | Status: AC
  Administered 2015-10-18: 324 mg via ORAL
  Filled 2015-10-18: qty 4

## 2015-10-18 MED ORDER — MORPHINE SULFATE (PF) 2 MG/ML IV SOLN
2.0000 mg | INTRAVENOUS | Status: AC
  Administered 2015-10-18: 2 mg via INTRAVENOUS
  Filled 2015-10-18: qty 1

## 2015-10-18 MED ORDER — HYDRALAZINE HCL 50 MG PO TABS
100.0000 mg | ORAL_TABLET | Freq: Three times a day (TID) | ORAL | Status: DC
  Administered 2015-10-18 – 2015-10-30 (×33): 100 mg via ORAL
  Filled 2015-10-18 (×33): qty 2

## 2015-10-18 MED ORDER — MORPHINE SULFATE (PF) 2 MG/ML IV SOLN
1.0000 mg | INTRAVENOUS | Status: DC | PRN
  Administered 2015-10-18 – 2015-10-29 (×7): 2 mg via INTRAVENOUS
  Filled 2015-10-18 (×7): qty 1

## 2015-10-18 MED ORDER — PROCHLORPERAZINE EDISYLATE 5 MG/ML IJ SOLN
10.0000 mg | Freq: Once | INTRAMUSCULAR | Status: AC
  Administered 2015-10-18: 10 mg via INTRAVENOUS
  Filled 2015-10-18: qty 2

## 2015-10-18 MED ORDER — SEVELAMER CARBONATE 800 MG PO TABS
1600.0000 mg | ORAL_TABLET | Freq: Three times a day (TID) | ORAL | Status: DC
  Administered 2015-10-18 – 2015-10-27 (×18): 1600 mg via ORAL
  Filled 2015-10-18 (×22): qty 2

## 2015-10-18 MED ORDER — MINOXIDIL 2.5 MG PO TABS
7.5000 mg | ORAL_TABLET | Freq: Two times a day (BID) | ORAL | Status: DC
  Administered 2015-10-18 – 2015-10-30 (×23): 7.5 mg via ORAL
  Filled 2015-10-18 (×28): qty 3

## 2015-10-18 MED ORDER — FAMOTIDINE IN NACL 20-0.9 MG/50ML-% IV SOLN: 20.0000 mg | Freq: Two times a day (BID) | INTRAVENOUS | Status: DC

## 2015-10-18 MED ORDER — METOPROLOL TARTRATE 5 MG/5ML IV SOLN
5.0000 mg | Freq: Four times a day (QID) | INTRAVENOUS | Status: DC | PRN
  Administered 2015-10-18 – 2015-10-19 (×2): 5 mg via INTRAVENOUS
  Filled 2015-10-18 (×3): qty 5

## 2015-10-18 MED ORDER — FAMOTIDINE IN NACL 20-0.9 MG/50ML-% IV SOLN
20.0000 mg | INTRAVENOUS | Status: DC
  Administered 2015-10-18 – 2015-10-19 (×2): 20 mg via INTRAVENOUS
  Filled 2015-10-18 (×3): qty 50

## 2015-10-18 NOTE — Progress Notes (Signed)
Pt having esophageal reflux and right sided lower lobe CP. Pt reports "I don't feel good". 12 lead EKG obtained BP 135/51 HR 114 and Dr Izola PriceMyers notified.

## 2015-10-18 NOTE — Progress Notes (Signed)
Asked to see patient c/o chest pain.  Alert - smiling, hot and dry - RR regular but little rapid rate 24 - O2 sats 98% on RA - BP 168/78 HR St 124 - reports burning right side chest that started sometime yesterday - worse today - pain not reproducalbe with cough - does not radiate - denies SOB - bil BS clear - patient reports emesis today dark brown - staff has not witnessed - 12 lead EKG with some ST depression noted - no previous to compare - has been tachycardic since yesterday.  No acute distress noted.  Has GI cocktail prn - suggested giving that  - labs pending -  [facility] NP to see per report Caryl AspKellie RN who spoke with Dr. Lenise ArenaMeyers.  RRT to follow as needed.

## 2015-10-18 NOTE — Consult Note (Signed)
Cardiology Consult    Patient ID: Joseph Villegas MRN: [id number], DOB/AGE: 1990/04/21   Admit date: 10/08/2015 Date of Consult: 10/18/2015  Primary Physician: No PCP Per Patient Primary Cardiologist: None Requesting Provider: ---  Patient Profile    25 y o man with CP and elevated trop   Past Medical History   Past Medical History  Diagnosis Date  . Hypertension     Past Surgical History  Procedure Laterality Date  . Pilonidal cyst / sinus excision  2008     Allergies  No Known Allergies  History of Present Illness    Mr Joseph Villegas has a PMH of long standing hypertension since he is 25 y old. He ahs nbeen on antihypertensive BP meds since then will reportedly good control (130's syst). He has not been followed on a regular basis. He has been told to have bad renal fucntion years ago (also with creat of 2 in care everywhere from 2013). He denies any other co morbidities. No rashes, joint problems.   10+ days ago he was admitted for renal dysfunction. Underwent a renal bx and was started on HD. Last HD was 3 days ago. 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 Last night he started to feel retrosternal discomfort which he attributed to reflux. Today this got worse and he started feeling nausease and threw up once. His pain got worse around 6 pm with 8/10 retrosternal pain, mostly right, non radiating. Nothing that makes it worse. Breathing makes no difference. Position makes no difference. He has no fevers, no chills no NS. He was given a GI cocktail with only mild relieve.   His initial ECG came back with ST changes in lateral leads. First trop was elevated to 0.9.   Inpatient Medications    . amLODipine  10 mg Oral Daily  . aspirin  324 mg Oral Once  . carvedilol  25 mg Oral BID WC  . famotidine (PEPCID) IV  20 mg Intravenous Q24H  . furosemide  160 mg Oral BID  . heparin subcutaneous  5,000 Units Subcutaneous Q8H  . hydrALAZINE  100  mg Oral Q8H  . minoxidil  7.5 mg Oral BID  .  morphine injection  2 mg Intravenous NOW  . prochlorperazine  10 mg Intravenous Once  . sevelamer carbonate  1,600 mg Oral TID WC  . spironolactone  25 mg Oral Daily    Family History    Family History  Problem Relation Age of Onset  . Hypertension Mother   . Diabetes Mother   . Hypertension Brother   . Diabetes Sister   . CAD Father     Social History    Social History   Social History  . Marital Status: Single    Spouse Name: N/A  . Number of Children: N/A  . Years of Education: N/A   Occupational History  . Not on file.   Social History Main Topics  . Smoking status: Never Smoker   . Smokeless tobacco: Never Used  . Alcohol Use: No  . Drug Use: No  . Sexual Activity: Not Currently   Other Topics Concern  . Not on file   Social History Narrative  . No narrative on file     Review of Systems    General:  No chills, fever, night sweats or weight changes.  Cardiovascular:  No chest pain, dyspnea on exertion, edema, orthopnea, palpitations, paroxysmal nocturnal dyspnea. Dermatological: No rash, lesions/masses Respiratory:  No cough, dyspnea Urologic: No hematuria, dysuria Abdominal:   No nausea, vomiting, diarrhea, bright red blood per rectum, melena, or hematemesis Neurologic:  No visual changes, wkns, changes in mental status. All other systems reviewed and are otherwise negative except as noted above.  Physical Exam    Blood pressure 165/90, pulse 110, temperature 98.7 F (37.1 C), temperature source Oral, resp. rate 22, height 5\' 10"  (1.778 m), weight 113.7 kg (250 lb 10.6 oz), SpO2 97 %.  General: Pleasant, NAD Psych: Normal affect. Neuro: Alert and oriented X 3. Moves all extremities spontaneously. HEENT: Normal  Neck: Supple without bruits or JVD. Lungs:  Resp regular and unlabored, CTA. Heart: tachycardic, 3/6 syts m over AVA Abdomen: Soft, non-tender, non-distended, BS + x 4.  Extremities: No  clubbing, cyanosis or edema. DP/PT/Radials 2+ and equal bilaterally.  Labs    Troponin (Point of Care Test) No results for input(s): TROPIPOC in the last 72 hours.  Recent Labs  10/18/15 1813  TROPONINI 0.98*   Lab Results  Component Value Date   WBC 14.2* 10/18/2015   HGB 12.1* 10/18/2015   HCT 33.6* 10/18/2015   MCV 82.0 10/18/2015   PLT 349 10/18/2015    Recent Labs Lab 10/18/15 0536  NA 129*  K 3.1*  CL 84*  CO2 25  BUN 112*  CREATININE 13.29*  CALCIUM 9.5  GLUCOSE 121*   No results found for: CHOL, HDL, LDLCALC, TRIG No results found for: [facility]   Radiology Studies    US Renal  10/08/2015  CLINICAL DATA:  Acute renal failure. Hematuria today. Weakness, fever and fatigue. Strep pharyngitis for the past week. EXAM: RENAL / URINARY TRACT ULTRASOUND COMPLETE COMPARISON:  06/14/2012. FINDINGS: Right Kidney: Length: 11.1 cm. Markedly echogenic with marked progression. No hydronephrosis, mass or calculi seen. Left Kidney: Length: 11.1 cm. Markedly echogenic with marked progression. Poorly visualized collecting system without gross hydronephrosis. No mass or calculi seen. Bladder: Appears normal for degree of bladder distention. Additional finding:  Small right pleural effusion. IMPRESSION: 1. Interval markedly increased echogenicity of both kidneys compatible with severe medical renal disease. 2. No definite hydronephrosis. 3. Small right pleural effusion. Electronically Signed   By: Beckie Salts M.D.   On: 10/08/2015 16:56   Ir Fluoro Guide Cv Line Right  10/15/2015  CLINICAL DATA:  Nephrotic syndrome and renal failure requiring hemodialysis. EXAM: TUNNELED CENTRAL VENOUS HEMODIALYSIS CATHETER PLACEMENT WITH ULTRASOUND AND FLUOROSCOPIC GUIDANCE ANESTHESIA/SEDATION: 1.0 mg IV Versed; 50 mcg IV Fentanyl. Total Moderate Sedation Time 30 minutes. The patient's level of consciousness and physiologic status were continuously monitored during the procedure by Radiology nursing.  MEDICATIONS: 2 g IV Ancef. As antibiotic prophylaxis, Ancef was ordered pre-procedure and administered intravenously within one hour of incision. FLUOROSCOPY TIME:  42 seconds. PROCEDURE: The procedure, risks, benefits, and alternatives were explained to the patient. Questions regarding the procedure were encouraged and answered. The patient understands and consents to the procedure. A time-out was performed prior to initiating the procedure. Ultrasound was used to confirm patency of the right internal jugular vein. The right neck and chest were prepped with chlorhexidine in a sterile fashion, and a sterile drape was applied covering the operative field. Maximum barrier sterile technique with sterile gowns and gloves were used for the procedure. Local anesthesia was provided with 1% lidocaine. After creating a small venotomy incision, a 21 gauge needle was advanced into the right internal jugular vein under direct, real-time ultrasound guidance. Ultrasound image documentation was performed. After securing guidewire access, an  8 Fr dilator was placed. A J-wire was kinked to measure appropriate catheter length. A Palindrome tunneled hemodialysis catheter measuring 19 cm from tip to cuff was chosen for placement. This was tunneled in a retrograde fashion from the chest wall to the venotomy incision. At the venotomy, serial dilatation was performed and a 16 Fr peel-away sheath was placed over a guidewire. The catheter was then placed through the sheath and the sheath removed. Final catheter positioning was confirmed and documented with a fluoroscopic spot image. The catheter was aspirated, flushed with saline, and injected with appropriate volume heparin dwells. The venotomy incision was closed with subcutaneous 3-0 Monocryl and subcuticular 4-0 Vicryl. Dermabond was applied to the incision. The catheter exit site was secured with 0-Prolene retention sutures. COMPLICATIONS: None.  No pneumothorax. FINDINGS: After  catheter placement, the tip lies in the right atrium. The catheter aspirates normally and is ready for immediate use. IMPRESSION: Placement of tunneled hemodialysis catheter via the right internal jugular vein. The catheter tip lies in the right atrium. The catheter is ready for immediate use. Electronically Signed   By: Irish Lack M.D.   On: 10/15/2015 11:23   Ir US Guide Vasc Access Left  10/15/2015  CLINICAL DATA:  Nephrotic syndrome and renal failure requiring hemodialysis. EXAM: TUNNELED CENTRAL VENOUS HEMODIALYSIS CATHETER PLACEMENT WITH ULTRASOUND AND FLUOROSCOPIC GUIDANCE ANESTHESIA/SEDATION: 1.0 mg IV Versed; 50 mcg IV Fentanyl. Total Moderate Sedation Time 30 minutes. The patient's level of consciousness and physiologic status were continuously monitored during the procedure by Radiology nursing. MEDICATIONS: 2 g IV Ancef. As antibiotic prophylaxis, Ancef was ordered pre-procedure and administered intravenously within one hour of incision. FLUOROSCOPY TIME:  42 seconds. PROCEDURE: The procedure, risks, benefits, and alternatives were explained to the patient. Questions regarding the procedure were encouraged and answered. The patient understands and consents to the procedure. A time-out was performed prior to initiating the procedure. Ultrasound was used to confirm patency of the right internal jugular vein. The right neck and chest were prepped with chlorhexidine in a sterile fashion, and a sterile drape was applied covering the operative field. Maximum barrier sterile technique with sterile gowns and gloves were used for the procedure. Local anesthesia was provided with 1% lidocaine. After creating a small venotomy incision, a 21 gauge needle was advanced into the right internal jugular vein under direct, real-time ultrasound guidance. Ultrasound image documentation was performed. After securing guidewire access, an 8 Fr dilator was placed. A J-wire was kinked to measure appropriate catheter  length. A Palindrome tunneled hemodialysis catheter measuring 19 cm from tip to cuff was chosen for placement. This was tunneled in a retrograde fashion from the chest wall to the venotomy incision. At the venotomy, serial dilatation was performed and a 16 Fr peel-away sheath was placed over a guidewire. The catheter was then placed through the sheath and the sheath removed. Final catheter positioning was confirmed and documented with a fluoroscopic spot image. The catheter was aspirated, flushed with saline, and injected with appropriate volume heparin dwells. The venotomy incision was closed with subcutaneous 3-0 Monocryl and subcuticular 4-0 Vicryl. Dermabond was applied to the incision. The catheter exit site was secured with 0-Prolene retention sutures. COMPLICATIONS: None.  No pneumothorax. FINDINGS: After catheter placement, the tip lies in the right atrium. The catheter aspirates normally and is ready for immediate use. IMPRESSION: Placement of tunneled hemodialysis catheter via the right internal jugular vein. The catheter tip lies in the right atrium. The catheter is ready for immediate use. Electronically Signed  By: Irish LackGlenn  Yamagata M.D.   On: 10/15/2015 11:23   Ir Koreas Guide Bx Asp/drain  10/15/2015  CLINICAL DATA:  Nephrotic syndrome, malignant hypertension and renal failure. The patient requires renal biopsy. EXAM: ULTRASOUND GUIDED CORE BIOPSY OF LEFT KIDNEY MEDICATIONS: 1.0 mg IV Versed; 50 mcg IV Fentanyl Total Moderate Sedation Time: 30 minutes. The patient's level of consciousness and physiologic status were continuously monitored during the procedure by Radiology nursing. PROCEDURE: The procedure, risks, benefits, and alternatives were explained to the patient. Questions regarding the procedure were encouraged and answered. The patient understands and consents to the procedure. A time-out was performed prior to the procedure. The left flank region was prepped with chlorhexidine in a sterile  fashion, and a sterile drape was applied covering the operative field. A sterile gown and sterile gloves were used for the procedure. Local anesthesia was provided with 1% Lidocaine. Ultrasound was performed of both kidneys in a prone position. The left kidney was chosen for biopsy. Two passes were made at the level of lower pole cortex with a 16 gauge core biopsy device. Core samples were submitted in saline. Post biopsy imaging was performed by ultrasound. COMPLICATIONS: None. FINDINGS: The kidneys are very echogenic bilaterally. The left kidney was better visualized by ultrasound for biopsy purposes compared to the right. Solid tissue samples were obtained. IMPRESSION: Ultrasound-guided random core biopsy performed through lower pole cortex of the left kidney. Electronically Signed   By: Irish LackGlenn  Yamagata M.D.   On: 10/15/2015 11:28    ECG & Cardiac Imaging    ST with HR of 110, LVH, St change sin anterolateral leads which could be repol abnormalities, can not exclude ischemic changes.   Assessment & Plan    Mr Joseph LaxLockhart is a 25 y o man with long standing HTN and CKD. Now with progressive renal disease in failure. He is hypertensive to 200's syst and developed CP over last 24h. ECG with ST changes in anterolateral leads which could be ischemic changes or simply repo changes given his LVH. He is now only minimally symptomatic with 1/10 chest discomfort. He was falling asleep as I was talking to him thus I dont think he is very bothered by it now. I performed a bedside TTE on him which showed no wall motion abn., moderate to severe LVH. RV was hyperdynamic. LVEF >60% with some outflow tract turbulence/obstruction. His trop is elevated which makes this an NSTEMI. The DD: is a type 2 NSTEMI in setting of HTN and volume overload. Alternatively one need to consider myopericarditis in setting of renal failure. Since the renal bx results do not appear to be final I am also thinking of SLE which could explain renal  failure and myocarditis. My concern for a primary CAD event is low. Finally a PE is low on my DD given normal O2 sats,  Recommendations:  - BP control to a goal of <160 - If cont to have CP or BP >160 can start nitro drip - keep NPO for now - trend trop - repeat ECG - TTE in am - Hold off blood thinners for now  Signed, Macario GoldsMarat Lacey Dotson, MD 10/18/2015, 10:18 PM

## 2015-10-18 NOTE — Progress Notes (Addendum)
RN, Lowella BandyNikki, paged critical troponin level of .98. NP reviewed chart and spoke to RN. Apparently, per dayshift RN report, pt began having right sided, burning CP after lunch today and this has progressively worsened. By 1730ish hours, pt's pain was peaked and attending was notified. Rapid response RN had come to bedside. RRRN charted that attending was aware and that NP (Editor, commissioningmeaning writer) would come to bedside. However, just to clarify, this NP was not notified of the need for a bedside visit.  Pt had vomited x 2 and was diaphoretic according to RacelandNikki, Charity fundraiserN. Pt had MSO4 without relief and GI cocktail "may have helped some". BP up around 170 at that time but has been even higher since admission (pt with uncontrolled HTN). Morphine 2mg  repeated, ASA 324mg  given and O2 applied. 12 lead EKG around 1730 hrs showed ? ST depression.  Cardiology fellow called immediately. We thoroughly discussed case and cards reviewed EKG and chart. Cardiology fellow went to bedside.  After this, discussed plan with cards fellow: at the time of his pt visit, pt was pain free to 1/10. BP 160s. Cards says this is likely a Type II ischemic event. Pt may need future cardiac cath but only after his acute illness is under control/resolved. Cards stated his EKG changes could be normal for AAM and could represent myocarditis, although less likely. Also, discussed PE, but suspicion low per cards. Cards did echo at bedside which looked OK.  Recs: echocardiogram in am. Should pt have further pain and high BP, will transfer pt to SDU on NTG drip. Cards will round in the am and make further recommendations.  Will follow-up next troponins and watch pt closely. RN called again and plan discussed. She should let me know if pt has further CP and/or elevated BP.  KJKG, NP [facility] Update: BP up once again, responded to Labetalol. Troponins trending downward. No further CP. Voiding in small amounts, c/o pressure. UA sent.

## 2015-10-18 NOTE — Progress Notes (Signed)
Dr Izola PriceMyers returned call. New orders received and implemented. Repeat EKG completed. Rapid Response Notified.

## 2015-10-18 NOTE — Progress Notes (Signed)
Pt stated that he has had emesis x 2 in sink - reports brown color (Unwitnessed). Zofran given - Dr Izola PriceMyers notified

## 2015-10-18 NOTE — Progress Notes (Signed)
Patient ID: Joseph Villegas, male   DOB: [birth year], 25 y.o.   MRN: [id number]    PROGRESS NOTE    Joseph Villegas  JOA:[id number] DOB: Jul 15, 1990 DOA: 10/08/2015  PCP: No PCP Per Patient   Brief Narrative:  25 y.o. year-old with HTN, 1 week history of fever, cough, sore throat, weakness. His evaluation there was significant for hypertension (204/152), leukocytosis to 17,000, mild thrombocytopenia of 120K, creatinine of 7.17 and baseline creatinine not known. Urinalysis revealed >300 protein, large blood, TNTC RBC's. Rapid strep test +. Nephrology consulted.    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 - renal U/S suggests advanced CKD from HTN - renal biopsy done July 10th, 2017, results pending  - C3 144, C4 40, ANCA neg PR3 and MPO neg - ASO WNL, GBM neg, HCV neg, HIV neg - did not tolerate HD well on 7/11 - per nephrology team, will hold off on HD today - pt still needs to be clipped so he is not ready for discharge yet   Hypertensive urgency, likely malignant  - with renal insufficiency and suggestion of TMA - was on lasix 160 BID, norvasc 10 Daily, Coreg 25 BID, hydralazine 25 mg TID, spironolactone 25 mg PO QD - stopped clonidine, doxazosin - SBP in 160's this AM  Strep throat  - completed therapy with amoxicillin   Hyponatremia - monitor  - BMP in AM  Hypokalemia - mild, supplement  - BMP in AM  Thrombocytopenia - resolved  - CBC In AM  Obesity  - Body mass index is 34.48 kg/(m^2).  DVT prophylaxis: Heparin SQ Code Status: Full  Family Communication: Patient at bedside, father at bedside  Disposition Plan: Home once nephrology team clears   Consultants:   Nephrology   IR  Procedures:   Renal biopsy by IR 7/10  IR placed R IJ TDC 7/10  Antimicrobials:   Amoxicillin completed   Subjective: Reports feeling better.   Objective: Filed Vitals:   10/18/15 0500 10/18/15 1041 10/18/15 1442 10/18/15 1530  BP: 194/109 185/108 205/110 169/88    Pulse: 113  105 115  Temp: 98.3 F (36.8 C)     TempSrc: Oral     Resp: 18     Height:      Weight:      SpO2: 96%       Intake/Output Summary (Last 24 hours) at 10/18/15 1537 Last data filed at 10/18/15 1300  Gross per 24 hour  Intake   1080 ml  Output      0 ml  Net   1080 ml   Filed Weights   10/16/15 1459 10/16/15 2043 10/17/15 2029  Weight: 116.9 kg (257 lb 11.5 oz) 113.399 kg (250 lb) 113.7 kg (250 lb 10.6 oz)    Examination:  General exam: Appears calm and comfortable  Respiratory system: Clear to auscultation. Respiratory effort normal. Cardiovascular system: S1 & S2 heard, RRR. No JVD, murmurs, rubs, gallops or clicks.  Gastrointestinal system: Abdomen is nondistended, soft and nontender. No organomegaly or masses felt.  Central nervous system: Alert and oriented. No focal neurological deficits. Extremities: Symmetric 5 x 5 power.  Data Reviewed: I have personally reviewed following labs and imaging studies  CBC:  Recent Labs Lab 10/15/15 0617 10/15/15 1613 10/16/15 0727 10/17/15 0351 10/18/15 0536  WBC 10.3 9.1 10.0 12.1* 14.2*  HGB 12.5* 12.4* 12.1* 11.8* 12.1*  HCT 36.4* 35.8* 34.7* 33.6* 33.6*  MCV 83.3 83.4 83.2 82.0 82.0  PLT 195 218  233 272 628   Basic Metabolic Panel:  Recent Labs Lab 10/13/15 1114 10/14/15 1115 10/15/15 0617 10/16/15 0727 10/17/15 0353 10/18/15 0536  NA 134* 134* 132* 132* 133* 129*  K 3.5 3.3* 3.6 3.7 3.4* 3.1*  CL 98* 97* 94* 92* 93* 84*  CO2 22 21* 22 22 23 25   GLUCOSE 85 112* 107* 98 108* 121*  BUN 78* 84* 93* 110* 104* 112*  CREATININE 10.11* 10.82* 12.09* 14.76* 13.47* 13.29*  CALCIUM 9.2 9.6 9.7 9.4 9.4 9.5  PHOS 7.1* 7.1*  --  11.8* 9.9* 10.3*   Liver Function Tests:  Recent Labs Lab 10/13/15 1114 10/14/15 1115 10/16/15 0727 10/17/15 0353 10/18/15 0536  ALBUMIN 3.1* 3.4* 3.3* 3.3* 3.5   Coagulation Profile:  Recent Labs Lab 10/14/15 1115  INR 1.15   Urine analysis:    Component Value  Date/Time   COLORURINE YELLOW 10/08/2015 0905   APPEARANCEUR CLOUDY* 10/08/2015 0905   LABSPEC 1.021 10/08/2015 0905   PHURINE 5.0 10/08/2015 0905   GLUCOSEU NEGATIVE 10/08/2015 0905   HGBUR LARGE* 10/08/2015 0905   BILIRUBINUR NEGATIVE 10/08/2015 0905   KETONESUR NEGATIVE 10/08/2015 0905   PROTEINUR >300* 10/08/2015 0905   NITRITE NEGATIVE 10/08/2015 0905   LEUKOCYTESUR NEGATIVE 10/08/2015 0905   Radiology Studies: US Renal 10/08/2015  Interval markedly increased echogenicity of both kidneys compatible with severe medical renal disease. 2. No definite hydronephrosis. 3. Small right pleural effusion.   Scheduled Meds: . amLODipine  10 mg Oral Daily  . carvedilol  25 mg Oral BID WC  . furosemide  160 mg Oral BID  . heparin subcutaneous  5,000 Units Subcutaneous Q8H  . hydrALAZINE  100 mg Oral Q8H  . minoxidil  7.5 mg Oral BID  . sevelamer carbonate  1,600 mg Oral TID WC  . spironolactone  25 mg Oral Daily   Continuous Infusions:    LOS: 10 days   Time spent: 20 minutes   Faye Ramsay, MD [facility] Pager [phone]  If 7PM-7AM, please contact night-coverage [email or web address] Password [facility] 10/18/2015, 3:37 PM

## 2015-10-18 NOTE — Progress Notes (Signed)
Admit: 10/08/2015 LOS: 10  3M admit with SCr of 7(Acute GN vs progressive CKD), hematuria, severe HTN, streptococcal pharyngitis  Subjective:  BP back up some I/Os not recorded Feels fine/ no uremia  07/12 0701 - 07/13 0700 In: 1080 [P.O.:1080] Out: 0   Filed Weights   10/16/15 1459 10/16/15 2043 10/17/15 2029  Weight: 116.9 kg (257 lb 11.5 oz) 113.399 kg (250 lb) 113.7 kg (250 lb 10.6 oz)    Scheduled Meds: . amLODipine  10 mg Oral Daily  . carvedilol  25 mg Oral BID WC  . furosemide  160 mg Oral BID  . heparin subcutaneous  5,000 Units Subcutaneous Q8H  . hydrALAZINE  100 mg Oral Q8H  . minoxidil  7.5 mg Oral BID  . spironolactone  25 mg Oral Daily   Continuous Infusions:  PRN Meds:.sodium chloride, sodium chloride, acetaminophen **OR** acetaminophen, alteplase, gi cocktail, heparin, hydrALAZINE, HYDROcodone-acetaminophen, lidocaine (PF), lidocaine-prilocaine, ondansetron **OR** ondansetron (ZOFRAN) IV, pentafluoroprop-tetrafluoroeth, polyethylene glycol  Current Labs: reviewed   Physical Exam:  Blood pressure 185/108, pulse 113, temperature 98.3 F (36.8 C), temperature source Oral, resp. rate 18, height 5\' 10"  (1.778 m), weight 113.7 kg (250 lb 10.6 oz), SpO2 96 %. 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 GN w/u negative 2. Might recover with time + BP control 3. Hold on HD CLIP 2. HTN, malignant 3. Hyperphosphatemia  P 1. Hold on HD today, see how labs look and UOP 2. Add phos binder 3. Inc minoxidil to 7.5 BMD and resume full dose hydralazine (recent stop of  clonidine and doxazosin) Daily weights, Daily Renal Panel, Strict I/Os, Avoid nephrotoxins (NSAIDs, judicious IV Contrast)   Pearson Grippe MD 10/18/2015, 11:13 AM   Recent Labs Lab 10/16/15 0727 10/17/15 0353 10/18/15 0536  NA 132* 133* 129*  K 3.7 3.4* 3.1*  CL 92* 93* 84*  CO2 22 23 25   GLUCOSE 98 108* 121*  BUN 110* 104* 112*  CREATININE 14.76* 13.47* 13.29*  CALCIUM 9.4 9.4 9.5  PHOS 11.8* 9.9* 10.3*    Recent Labs Lab 10/16/15 0727 10/17/15 0351 10/18/15 0536  WBC 10.0 12.1* 14.2*  HGB 12.1* 11.8* 12.1*  HCT 34.7* 33.6* 33.6*  MCV 83.2 82.0 82.0  PLT 233 272 349

## 2015-10-19 ENCOUNTER — Inpatient Hospital Stay (HOSPITAL_COMMUNITY): Payer: Medicaid Other

## 2015-10-19 DIAGNOSIS — N185 Chronic kidney disease, stage 5: Secondary | ICD-10-CM

## 2015-10-19 DIAGNOSIS — I10 Essential (primary) hypertension: Secondary | ICD-10-CM

## 2015-10-19 DIAGNOSIS — R7989 Other specified abnormal findings of blood chemistry: Secondary | ICD-10-CM

## 2015-10-19 DIAGNOSIS — N179 Acute kidney failure, unspecified: Secondary | ICD-10-CM

## 2015-10-19 LAB — CBC
HCT: 34.1 % — ABNORMAL LOW (ref 39.0–52.0)
HEMOGLOBIN: 12 g/dL — AB (ref 13.0–17.0)
MCH: 28.8 pg (ref 26.0–34.0)
MCHC: 35.2 g/dL (ref 30.0–36.0)
MCV: 82 fL (ref 78.0–100.0)
Platelets: 399 10*3/uL (ref 150–400)
RBC: 4.16 MIL/uL — AB (ref 4.22–5.81)
RDW: 13.5 % (ref 11.5–15.5)
WBC: 16 10*3/uL — AB (ref 4.0–10.5)

## 2015-10-19 LAB — URINALYSIS, ROUTINE W REFLEX MICROSCOPIC
Bilirubin Urine: NEGATIVE
Glucose, UA: NEGATIVE mg/dL
Hgb urine dipstick: NEGATIVE
KETONES UR: NEGATIVE mg/dL
LEUKOCYTES UA: NEGATIVE
NITRITE: NEGATIVE
PH: 5 (ref 5.0–8.0)
Protein, ur: 100 mg/dL — AB
SPECIFIC GRAVITY, URINE: 1.019 (ref 1.005–1.030)

## 2015-10-19 LAB — RENAL FUNCTION PANEL
ANION GAP: 21 — AB (ref 5–15)
Albumin: 3.9 g/dL (ref 3.5–5.0)
BUN: 120 mg/dL — ABNORMAL HIGH (ref 6–20)
CALCIUM: 9.6 mg/dL (ref 8.9–10.3)
CHLORIDE: 81 mmol/L — AB (ref 101–111)
CO2: 25 mmol/L (ref 22–32)
Creatinine, Ser: 13.67 mg/dL — ABNORMAL HIGH (ref 0.61–1.24)
GFR calc non Af Amer: 4 mL/min — ABNORMAL LOW (ref >60)
GFR, EST AFRICAN AMERICAN: 5 mL/min — AB (ref >60)
Glucose, Bld: 127 mg/dL — ABNORMAL HIGH (ref 65–99)
Phosphorus: 11.3 mg/dL — ABNORMAL HIGH (ref 2.5–4.6)
Potassium: 3.5 mmol/L (ref 3.5–5.1)
Sodium: 127 mmol/L — ABNORMAL LOW (ref 135–145)

## 2015-10-19 LAB — URINE MICROSCOPIC-ADD ON

## 2015-10-19 LAB — TROPONIN I
TROPONIN I: 0.91 ng/mL — AB (ref <0.03)
Troponin I: 0.88 ng/mL (ref <0.03)

## 2015-10-19 LAB — ECHOCARDIOGRAM COMPLETE
HEIGHTINCHES: 70 in
WEIGHTICAEL: 4010.61 [oz_av]

## 2015-10-19 MED ORDER — VERAPAMIL HCL ER 120 MG PO TBCR
120.0000 mg | EXTENDED_RELEASE_TABLET | Freq: Every day | ORAL | Status: DC
  Administered 2015-10-19 – 2015-10-20 (×2): 120 mg via ORAL
  Filled 2015-10-19 (×2): qty 1

## 2015-10-19 MED ORDER — ZOLPIDEM TARTRATE 5 MG PO TABS
5.0000 mg | ORAL_TABLET | Freq: Once | ORAL | Status: AC
  Administered 2015-10-19: 5 mg via ORAL
  Filled 2015-10-19: qty 1

## 2015-10-19 MED ORDER — PHENAZOPYRIDINE HCL 200 MG PO TABS
200.0000 mg | ORAL_TABLET | Freq: Three times a day (TID) | ORAL | Status: DC
  Administered 2015-10-19 – 2015-10-21 (×7): 200 mg via ORAL
  Filled 2015-10-19 (×8): qty 1

## 2015-10-19 MED ORDER — PANTOPRAZOLE SODIUM 40 MG PO TBEC
40.0000 mg | DELAYED_RELEASE_TABLET | Freq: Every day | ORAL | Status: DC
  Administered 2015-10-19 – 2015-10-30 (×11): 40 mg via ORAL
  Filled 2015-10-19 (×12): qty 1

## 2015-10-19 MED ORDER — LABETALOL HCL 5 MG/ML IV SOLN
10.0000 mg | Freq: Once | INTRAVENOUS | Status: AC
Start: 2015-10-19 — End: 2015-10-19
  Administered 2015-10-19: 10 mg via INTRAVENOUS

## 2015-10-19 MED ORDER — SODIUM CHLORIDE 0.9 % IV SOLN
INTRAVENOUS | Status: DC
  Administered 2015-10-19 – 2015-10-22 (×4): via INTRAVENOUS
  Administered 2015-10-22: 1000 mL via INTRAVENOUS

## 2015-10-19 NOTE — Progress Notes (Signed)
Pt c/o discomfort in penis area d/t foley catheter. Pt states he still feels the urge to void and urinate pass the cathter. Instructed pt not to bare down and forcefully attempt to urinate; pt verbalized understanding. Paged Dr. Blake DivineAkula in regards to pt c/o discomfort from foley catheter. Per Dr. Blake DivineAkula foley needs to stay in place d/t pt's urinary retention. Offered to reposition foley but pt declines offer. Will continue to monitor.

## 2015-10-19 NOTE — Progress Notes (Addendum)
Pt continued to c/o severe urinary urgency and frequency; with minimal output. This RN bladder scanned pt; bladder scan revealed 350cc and 332cc. Dr. Blake DivineAkula made aware of findings.   This RN performed intermittent cath on pt per MD order. This RN did not feel any resistance while inserting catheter. Pt tolerated procedure well. 400cc of clear yellow urine obtained. Pt states he no longer feels as though he has the urge to urinate and can not. Dr. Blake DivineAkula at bedside during procedure. Will continue to monitor.

## 2015-10-19 NOTE — Progress Notes (Signed)
Patient ID: Joseph Villegas, male   DOB: [birth year], 25 y.o.   MRN: [id number]    PROGRESS NOTE    Adlai Sinning  CVE:[id number] DOB: November 04, 1990 DOA: 10/08/2015  PCP: No PCP Per Patient No PCP Per Patient   Brief Narrative:  25 y.o. year-old with HTN, 1 week history of fever, cough, sore throat, weakness. His evaluation there was significant for hypertension (204/152), leukocytosis to 17,000, mild thrombocytopenia of 120K, creatinine of 7.17 and baseline creatinine not known. Urinalysis revealed >300 protein, large blood, TNTC RBC's. Rapid strep test +. Nephrology consulted.    7/14 pts echocardiogram unremarkable. Urinary retention, foley catheter placed.   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 - renal biopsy done July 10th, 2017, results pending , so far the work up for GN negative.  - C3 144, C4 40, ANCA neg PR3 and MPO neg - ASO WNL, GBM neg, HCV neg, HIV neg - did not tolerate HD well on 7/11, currently holding HD, to see if his renal parameters improve,  no signs of uremia.  - pt still needs to be clipped so he is not ready for discharge yet .  Chest pain, right sided and burning chest pain,  Associated with elevated blood pressure RR was called.  Elevated troponin, possibly from ARF.  EKG shows repolarization abnormalities.  Echo ordered and is unremarkable.  Cardiology consulted and recommended further work up after acute events .  Chest pain resolved after GI cock tail, probably GERD, added protonix.     Hypertensive urgency, likely malignant  - with renal insufficiency and suggestion of TMA - much improved - was on lasix 160 BID, norvasc 10 Daily, Coreg 25 BID, hydralazine 25 mg TID, spironolactone 25 mg PO QD - stopped clonidine, doxazosin -prn hydralazine .   Strep throat  - completed therapy with amoxicillin  -pain resolved.   Hyponatremia Worsening.  Probably from renal failure.  Currently asymptomatic.    Hypokalemia - repleted.  - repeat level is normal.   Thrombocytopenia - resolved  - CBC In AM  Obesity  - Body mass index is 34.48 kg/(m^2).  Acute urinary retention with spasms: UA done, no signs of infection. Foley catheter placed. And pyridium started.  Urine cultures ordered. No antibiotics ordered, as he just completed amoxicillin.      DVT prophylaxis: Heparin SQ Code Status: Full  Family Communication: none at bedside.  Disposition Plan: Home once nephrology team clears   Consultants:   Nephrology   IR  cardiology  Procedures:   Renal biopsy by IR 7/10  IR placed R IJ TDC 7/10  Echocardiogram.   Antimicrobials:   Amoxicillin completed   Subjective: Reports pain while urinating, difficulty urinating.   Objective: Filed Vitals:   10/18/15 2041 10/19/15 0210 10/19/15 0535 10/19/15 1400  BP: 165/90 185/107 170/98 149/85  Pulse: 110 102 100 101  Temp: 98.7 F (37.1 C) 98.1 F (36.7 C) 97.7 F (36.5 C)   TempSrc:  Oral Oral   Resp: 22  20   Height:      Weight:      SpO2: 97%  99%     Intake/Output Summary (Last 24 hours) at 10/19/15 1620 Last data filed at 10/19/15 1500  Gross per 24 hour  Intake      0 ml  Output   1000 ml  Net  -1000 ml   Filed Weights   10/16/15 1459 10/16/15 2043 10/17/15 2029  Weight: 116.9 kg (257 lb 11.5 oz) 113.399 kg (250 lb) 113.7 kg (250 lb 10.6 oz)    Examination:  General exam: Appears calm and comfortable  Respiratory system: Clear to auscultation. Respiratory effort normal. Cardiovascular system: S1 & S2 heard, RRR. No JVD, murmurs, rubs, gallops or clicks.  Gastrointestinal system: Abdomen is nondistended, soft and nontender. No organomegaly or masses felt.  Central nervous system: Alert and oriented. No focal neurological deficits. Extremities: Symmetric 5 x 5 power.  Data Reviewed: I have personally reviewed following labs and imaging studies  CBC:  Recent Labs Lab 10/15/15 1613  10/16/15 0727 10/17/15 0351 10/18/15 0536 10/19/15 0431  WBC 9.1 10.0 12.1* 14.2* 16.0*  HGB 12.4* 12.1* 11.8* 12.1* 12.0*  HCT 35.8* 34.7* 33.6* 33.6* 34.1*  MCV 83.4 83.2 82.0 82.0 82.0  PLT 218 233 272 349 250   Basic Metabolic Panel:  Recent Labs Lab 10/14/15 1115 10/15/15 0617 10/16/15 0727 10/17/15 0353 10/18/15 0536 10/19/15 0431  NA 134* 132* 132* 133* 129* 127*  K 3.3* 3.6 3.7 3.4* 3.1* 3.5  CL 97* 94* 92* 93* 84* 81*  CO2 21* 22 22 23 25 25   GLUCOSE 112* 107* 98 108* 121* 127*  BUN 84* 93* 110* 104* 112* 120*  CREATININE 10.82* 12.09* 14.76* 13.47* 13.29* 13.67*  CALCIUM 9.6 9.7 9.4 9.4 9.5 9.6  PHOS 7.1*  --  11.8* 9.9* 10.3* 11.3*   Liver Function Tests:  Recent Labs Lab 10/14/15 1115 10/16/15 0727 10/17/15 0353 10/18/15 0536 10/19/15 0431  ALBUMIN 3.4* 3.3* 3.3* 3.5 3.9   Coagulation Profile:  Recent Labs Lab 10/14/15 1115  INR 1.15   Urine analysis:    Component Value Date/Time   COLORURINE YELLOW 10/19/2015 0505   APPEARANCEUR CLOUDY* 10/19/2015 0505   LABSPEC 1.019 10/19/2015 0505   PHURINE 5.0 10/19/2015 0505   GLUCOSEU NEGATIVE 10/19/2015 0505   HGBUR NEGATIVE 10/19/2015 0505   BILIRUBINUR NEGATIVE 10/19/2015 0505   KETONESUR NEGATIVE 10/19/2015 0505   PROTEINUR 100* 10/19/2015 0505   NITRITE NEGATIVE 10/19/2015 0505   LEUKOCYTESUR NEGATIVE 10/19/2015 0505   Radiology Studies: US Renal 10/08/2015  Interval markedly increased echogenicity of both kidneys compatible with severe medical renal disease. 2. No definite hydronephrosis. 3. Small right pleural effusion.   Scheduled Meds: . amLODipine  10 mg Oral Daily  . carvedilol  25 mg Oral BID WC  . famotidine (PEPCID) IV  20 mg Intravenous Q24H  . furosemide  160 mg Oral BID  . heparin subcutaneous  5,000 Units Subcutaneous Q8H  . hydrALAZINE  100 mg Oral Q8H  . minoxidil  7.5 mg Oral BID  . pantoprazole  40 mg Oral Q0600  . phenazopyridine  200 mg Oral TID WC  . sevelamer  carbonate  1,600 mg Oral TID WC  . spironolactone  25 mg Oral Daily  . verapamil  120 mg Oral Daily   Continuous Infusions: . sodium chloride 50 mL/hr at 10/19/15 1411     LOS: 11 days   Time spent: 92 minutes   Laaibah Wartman, MD [facility] Pager [phone]  If 7PM-7AM, please contact night-coverage [email or web address] Password [email or web address] 10/19/2015, 4:20 PM

## 2015-10-19 NOTE — Progress Notes (Addendum)
PATIENT ID: Joseph Villegas is a 27M with malignant hypertension admitted with acute on chronic kidney failure and newly initiated HD.  Cardiology consulted for chest pain and elevated troponin.   SUBJECTIVE:  Feeling well this AM.  Symptoms resolved after his in/out foley catheterization and GERD treatment.  He feels that his symptoms are attributable to GERD.  CP free today and walking around the room.    PHYSICAL EXAM Filed Vitals:   10/18/15 1900 10/18/15 2041 10/19/15 0210 10/19/15 0535  BP: 168/78 165/90 185/107 170/98  Pulse: 122 110 102 100  Temp:  98.7 F (37.1 C) 98.1 F (36.7 C) 97.7 F (36.5 C)  TempSrc:   Oral Oral  Resp:  22  20  Height:      Weight:      SpO2: 99% 97%  99%   General:  Well-appearing.  No acute distress.  Neck: No JVD Lungs:  CTAB.  No crackles, rhonchi or wheezes Heart:  RRR.  III/VI systolic murmur at LLSB.  No r/g.  Abdomen:  Soft, NT, ND.  +BS Extremities:  Trace edema.  WWP.  LABS: Lab Results  Component Value Date   TROPONINI 0.88* 10/19/2015   Results for orders placed or performed during the hospital encounter of 10/08/15 (from the past 24 hour(s))  Troponin I     Status: Abnormal   Collection Time: 10/18/15  6:13 PM  Result Value Ref Range   Troponin I 0.98 (HH) <0.03 ng/mL  Troponin I (q 6hr x 3)     Status: Abnormal   Collection Time: 10/18/15 10:27 PM  Result Value Ref Range   Troponin I 0.95 (HH) <0.03 ng/mL  Renal function panel     Status: Abnormal   Collection Time: 10/19/15  4:31 AM  Result Value Ref Range   Sodium 127 (L) 135 - 145 mmol/L   Potassium 3.5 3.5 - 5.1 mmol/L   Chloride 81 (L) 101 - 111 mmol/L   CO2 25 22 - 32 mmol/L   Glucose, Bld 127 (H) 65 - 99 mg/dL   BUN 962120 (H) 6 - 20 mg/dL   Creatinine, Ser 95.2813.67 (H) 0.61 - 1.24 mg/dL   Calcium 9.6 8.9 - 41.310.3 mg/dL   Phosphorus 24.411.3 (H) 2.5 - 4.6 mg/dL   Albumin 3.9 3.5 - 5.0 g/dL   GFR calc non Af Amer 4 (L) >60 mL/min   GFR calc Af Amer 5 (L) >60 mL/min   Anion gap 21 (H) 5 - 15  CBC     Status: Abnormal   Collection Time: 10/19/15  4:31 AM  Result Value Ref Range   WBC 16.0 (H) 4.0 - 10.5 K/uL   RBC 4.16 (L) 4.22 - 5.81 MIL/uL   Hemoglobin 12.0 (L) 13.0 - 17.0 g/dL   HCT 01.034.1 (L) 27.239.0 - 53.652.0 %   MCV 82.0 78.0 - 100.0 fL   MCH 28.8 26.0 - 34.0 pg   MCHC 35.2 30.0 - 36.0 g/dL   RDW 64.413.5 03.411.5 - 74.215.5 %   Platelets 399 150 - 400 K/uL  Troponin I (q 6hr x 3)     Status: Abnormal   Collection Time: 10/19/15  4:31 AM  Result Value Ref Range   Troponin I 0.91 (HH) <0.03 ng/mL  Urinalysis, Routine w reflex microscopic (not at [facility])     Status: Abnormal   Collection Time: 10/19/15  5:05 AM  Result Value Ref Range   Color, Urine YELLOW YELLOW   APPearance CLOUDY (A) CLEAR  Specific Gravity, Urine 1.019 1.005 - 1.030   pH 5.0 5.0 - 8.0   Glucose, UA NEGATIVE NEGATIVE mg/dL   Hgb urine dipstick NEGATIVE NEGATIVE   Bilirubin Urine NEGATIVE NEGATIVE   Ketones, ur NEGATIVE NEGATIVE mg/dL   Protein, ur 161 (A) NEGATIVE mg/dL   Nitrite NEGATIVE NEGATIVE   Leukocytes, UA NEGATIVE NEGATIVE  Urine microscopic-add on     Status: Abnormal   Collection Time: 10/19/15  5:05 AM  Result Value Ref Range   Squamous Epithelial / LPF 0-5 (A) NONE SEEN   WBC, UA 6-30 0 - 5 WBC/hpf   RBC / HPF 0-5 0 - 5 RBC/hpf   Bacteria, UA RARE (A) NONE SEEN   Casts HYALINE CASTS (A) NEGATIVE  Troponin I (q 6hr x 3)     Status: Abnormal   Collection Time: 10/19/15  8:12 AM  Result Value Ref Range   Troponin I 0.88 (HH) <0.03 ng/mL    Intake/Output Summary (Last 24 hours) at 10/19/15 1006 Last data filed at 10/19/15 0535  Gross per 24 hour  Intake    240 ml  Output    600 ml  Net   -360 ml    EKG:  Sinus tachycardia rate 113.  LVH with repolarization abnormality.  Concern for prior lateral MI.   Echo: LVEF 65-70%.  Severe concentric LVH.  There is an LV intracavitary gradient due to LVH and hyperdynamic state.  Grade 1 diastolic dysfunction.  (my independent  preliminary interpretation)  ASSESSMENT AND PLAN:  Principal Problem:   CKD (chronic kidney disease) Active Problems:   HTN (hypertension)   Acute kidney injury (HCC)   Thrombocytopenia (HCC)   Hematuria   Leukocytosis   Accelerated hypertension   Streptococcal sore throat   Post-streptococcal glomerulonephritis   # Chest pain: Symptoms seem to have been related to GERD.  Although his EKG does have changes, they are consistent with LVH with repolarization abnormality.  His echo is reassuring and does not reveal any wall motion abnormalities.  Troponin is elevated, but stable and he has AoCKD.  Given that he is 24 the likelihood of ACS is slim.  No cath given that he may have recovery of renal function.  Consider stress once he is over this acute phase.  Will check fasting lipids to risk stratify.  # Malignant hypertension:  # LV intracavitary gradient:  Blood pressure remains poorly-controlled on multiple agents.  He is not on an ACE-I/ARB as there is hope for renal recovery.  On echo he has severe LVH and an intracavitary gradient.  He also has a murmur on exam.  This is due to his hypertension and LVH, not HCM.  The physiology is similar.  We will add verapamil  daily.  This will help with his hypertension and sinus tachycardia as well.   Time spent: 35 minutes-Greater than 50% of this time was spent in counseling, explanation of diagnosis, planning of further management, and coordination of care.    Briya Lookabaugh C. Duke Salvia, MD, [facility] 10/19/2015 10:06 AM

## 2015-10-19 NOTE — Progress Notes (Signed)
Patient ID: Joseph Villegas, male   DOB: [birth year], 25 y.o.   MRN: [id number] S: No new complaints O:BP 170/98 mmHg  Pulse 100  Temp(Src) 97.7 F (36.5 C) (Oral)  Resp 20  Ht  (1.778 m)  Wt 113.7 kg (250 lb 10.6 oz)  BMI 35.97 kg/m2  SpO2 99%  Intake/Output Summary (Last 24 hours) at 10/19/15 4098 Last data filed at 10/19/15 0535  Gross per 24 hour  Intake    240 ml  Output    600 ml  Net   -360 ml   Intake/Output: I/O last 3 completed shifts: In: 960 [P.O.:960] Out: 600 [Urine:600]  Intake/Output this shift:    Weight change:  Gen:WD WN AAM in NAD CVS:no rub Resp:cta JXB:JYNWGN Ext:no edema   Recent Labs Lab 10/13/15 1114 10/14/15 1115 10/15/15 0617 10/16/15 0727 10/17/15 0353 10/18/15 0536 10/19/15 0431  NA 134* 134* 132* 132* 133* 129* 127*  K 3.5 3.3* 3.6 3.7 3.4* 3.1* 3.5  CL 98* 97* 94* 92* 93* 84* 81*  CO2 22 21* GLUCOSE 85 112* 107* 98 108* 121* 127*  BUN 78* 84* 93* 110* 104* 112* 120*  CREATININE 10.11* 10.82* 12.09* 14.76* 13.47* 13.29* 13.67*  ALBUMIN 3.1* 3.4*  --  3.3* 3.3* 3.5 3.9  CALCIUM 9.2 9.6 9.7 9.4 9.4 9.5 9.6  PHOS 7.1* 7.1*  --  11.8* 9.9* 10.3* 11.3*   Liver Function Tests:  Recent Labs Lab 10/17/15 0353 10/18/15 0536 10/19/15 0431  ALBUMIN 3.3* 3.5 3.9   No results for input(s): LIPASE, AMYLASE in the last 168 hours. No results for input(s): AMMONIA in the last 168 hours. CBC:  Recent Labs Lab 10/15/15 1613 10/16/15 0727 10/17/15 0351 10/18/15 0536 10/19/15 0431  WBC 9.1 10.0 12.1* 14.2* 16.0*  HGB 12.4* 12.1* 11.8* 12.1* 12.0*  HCT 35.8* 34.7* 33.6* 33.6* 34.1*  MCV 83.4 83.2 82.0 82.0 82.0  PLT 218 233 272 349 399   Cardiac Enzymes:  Recent Labs Lab 10/18/15 1813 10/18/15 2227 10/19/15 0431 10/19/15 0812  TROPONINI 0.98* 0.95* 0.91* 0.88*   CBG: No results for input(s): GLUCAP in the last 168 hours.  Iron Studies: No results for input(s): IRON, TIBC, TRANSFERRIN, FERRITIN in  the last 72 hours. Studies/Results: No results found. Marland Kitchen amLODipine  10 mg Oral Daily  . carvedilol  25 mg Oral BID WC  . famotidine (PEPCID) IV  20 mg Intravenous Q24H  . furosemide  160 mg Oral BID  . heparin subcutaneous  5,000 Units Subcutaneous Q8H  . hydrALAZINE  100 mg Oral Q8H  . minoxidil  7.5 mg Oral BID  . sevelamer carbonate  1,600 mg Oral TID WC  . spironolactone  25 mg Oral Daily    BMET    Component Value Date/Time   NA 127* 10/19/2015 0431   K 3.5 10/19/2015 0431   CL 81* 10/19/2015 0431   CO2 25 10/19/2015 0431   GLUCOSE 127* 10/19/2015 0431   BUN 120* 10/19/2015 0431   CREATININE 13.67* 10/19/2015 0431   CALCIUM 9.6 10/19/2015 0431   GFRNONAA 4* 10/19/2015 0431   GFRAA 5* 10/19/2015 0431   CBC    Component Value Date/Time   WBC 16.0* 10/19/2015 0431   RBC 4.16* 10/19/2015 0431   HGB 12.0* 10/19/2015 0431   HCT 34.1* 10/19/2015 0431   PLT 399 10/19/2015 0431   MCV 82.0 10/19/2015 0431   MCH 28.8 10/19/2015 0431   MCHC 35.2 10/19/2015 0431   RDW  13.5 10/19/2015 0431   LYMPHSABS 1.5 10/08/2015 0920   MONOABS 0.7 10/08/2015 0920   EOSABS 0.0 10/08/2015 0920   BASOSABS 0.0 10/08/2015 0920    Assessment/Plan:  1. 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 Negative GN work up 2. Possibility of recovery 3. Continue to hold off on further HD for now and follow closely 4. No uremic symptoms at this time. 5. BUN rising out of proportion of Scr will start gentle IVF's and follow. 2. Malignant HTN- still not at goal.  Recent increase of minoxidil and resumption of hydralazine. 3. hyperphosphatemia  Julien NordmannJoseph A Lachlyn Vanderstelt

## 2015-10-19 NOTE — Progress Notes (Signed)
Pt c/o severe urinary urgency and frequency since 12mn, unable to sleep because he's getting up to urinate every 15 to 20 minutes with only minimal output.  Bladder scan revealed only 45cc   MD notified, carried out order for UA and CX, Tylenol given but ineffective.  Pt requesting to be cathed or given "Lasix to make me pee". Explained Bladder scan results. Vicodin given.

## 2015-10-19 NOTE — Progress Notes (Signed)
  Echocardiogram 2D Echocardiogram has been performed.  Joseph Villegas, Joseph Villegas 10/19/2015, 9:58 AM

## 2015-10-20 DIAGNOSIS — J020 Streptococcal pharyngitis: Secondary | ICD-10-CM

## 2015-10-20 DIAGNOSIS — I10 Essential (primary) hypertension: Secondary | ICD-10-CM | POA: Insufficient documentation

## 2015-10-20 LAB — RENAL FUNCTION PANEL
ALBUMIN: 3.3 g/dL — AB (ref 3.5–5.0)
ANION GAP: 24 — AB (ref 5–15)
BUN: 126 mg/dL — AB (ref 6–20)
CALCIUM: 9.5 mg/dL (ref 8.9–10.3)
CO2: 25 mmol/L (ref 22–32)
Chloride: 83 mmol/L — ABNORMAL LOW (ref 101–111)
Creatinine, Ser: 13.72 mg/dL — ABNORMAL HIGH (ref 0.61–1.24)
GFR calc Af Amer: 5 mL/min — ABNORMAL LOW (ref >60)
GFR calc non Af Amer: 4 mL/min — ABNORMAL LOW (ref >60)
GLUCOSE: 86 mg/dL (ref 65–99)
PHOSPHORUS: 11.8 mg/dL — AB (ref 2.5–4.6)
Potassium: 3.1 mmol/L — ABNORMAL LOW (ref 3.5–5.1)
SODIUM: 132 mmol/L — AB (ref 135–145)

## 2015-10-20 LAB — URINE CULTURE: CULTURE: NO GROWTH

## 2015-10-20 LAB — LIPID PANEL
CHOL/HDL RATIO: 7.7 ratio
Cholesterol: 238 mg/dL — ABNORMAL HIGH (ref 0–200)
HDL: 31 mg/dL — AB (ref >40)
LDL CALC: 158 mg/dL — AB (ref 0–99)
Triglycerides: 244 mg/dL — ABNORMAL HIGH (ref <150)
VLDL: 49 mg/dL — ABNORMAL HIGH (ref 0–40)

## 2015-10-20 MED ORDER — FUROSEMIDE 80 MG PO TABS
80.0000 mg | ORAL_TABLET | Freq: Two times a day (BID) | ORAL | Status: DC
  Administered 2015-10-20 – 2015-10-23 (×6): 80 mg via ORAL
  Filled 2015-10-20 (×6): qty 1

## 2015-10-20 MED ORDER — SPIRONOLACTONE 25 MG PO TABS
25.0000 mg | ORAL_TABLET | Freq: Every day | ORAL | Status: DC
  Administered 2015-10-21 – 2015-10-30 (×10): 25 mg via ORAL
  Filled 2015-10-20 (×10): qty 1

## 2015-10-20 MED ORDER — FAMOTIDINE 20 MG PO TABS
20.0000 mg | ORAL_TABLET | Freq: Every day | ORAL | Status: DC
  Administered 2015-10-20 – 2015-10-29 (×10): 20 mg via ORAL
  Filled 2015-10-20 (×10): qty 1

## 2015-10-20 MED ORDER — POTASSIUM CHLORIDE 20 MEQ PO PACK
20.0000 meq | PACK | Freq: Once | ORAL | Status: AC
  Administered 2015-10-20: 20 meq via ORAL
  Filled 2015-10-20: qty 1

## 2015-10-20 MED ORDER — VERAPAMIL HCL ER 240 MG PO TBCR
240.0000 mg | EXTENDED_RELEASE_TABLET | Freq: Every day | ORAL | Status: DC
  Administered 2015-10-21 – 2015-10-30 (×9): 240 mg via ORAL
  Filled 2015-10-20 (×10): qty 1

## 2015-10-20 NOTE — Care Management Note (Signed)
Case Management Note  Patient Details  Name: Joseph Villegas MRN: [id number] Date of Birth: February 02, 1991  Subjective/Objective:        Patient presented to [facility] ED with Malignant HTN with kidney involvement AKF vs CKF patient has required intermittent HD. Patient does not have PCP or insurance.            Action/Plan: CM met patient to discuss discharge planning. Patient is Medicaid Potential and does not have a PCP at present. CM discussed the [facility] and services rendered, patient is agreeable and is amendable to establishing care at the clinic. Appt scheduled for Tuesday 8-1 at 2:30 pm with Dr. Jarold Song. He reports using [facility] for medications he denies any difficulty obtaining monthly medications. No further CM needs identified.    Expected Discharge Date:                  Expected Discharge Plan:  Home/Self Care  In-House Referral:  NA  Discharge planning Services  CM Consult, Follow-up appt scheduled  Post Acute Care Choice:  NA Choice offered to:  Patient  DME Arranged:    DME Agency:  NA  HH Arranged:  NA HH Agency:  NA  Status of Service:  Completed, signed off  If discussed at [place] of Stay Meetings, dates discussed:    Additional CommentsLaurena Slimmer, RN 10/20/2015, 12:10 PM

## 2015-10-20 NOTE — Progress Notes (Signed)
Bladder Scan reveals 240ml. Dr Criselda PeachesMullen aware. MD coming to see patient as patient is refusing foley cath. Will monitor.

## 2015-10-20 NOTE — Progress Notes (Signed)
PROGRESS NOTE    Joseph PetrinLaquan Villegas  UJW:[id number] DOB: Aug 15, 1990 DOA: 10/08/2015 No PCP  Brief Narrative:  25 y.o. year-old with HTN, 1 week history of fever, cough, sore throat, weakness. His evaluation there was significant for hypertension (204/152), leukocytosis to 17,000, mild thrombocytopenia of 120K, creatinine of 7.17 and baseline creatinine not known. Urinalysis revealed >300 protein, large blood, TNTC RBC's. Rapid strep test +. Nephrology consulted.   7/14 pts echocardiogram unremarkable. Urinary retention, foley catheter placed.   7/15 - Patient wanted foley catheter out, had left sided flank pain after removed.     Assessment & Plan:   AKI vs. New CKD stage 5 - Renal ultrasound reviewed, suggested CKD from HTN and ATN - Renal biopsy done 7/10 - results pending - W/U for GN so far negative - Patient is making urine and did not tolerate HD so far, continue to monitor - Nephrology following  Chest pain, burning abdominal pain - Resolved per patient  Accelerated HTN with urgency - BP today still significantly high, with systolic ranging from 140s - 190s - On amlodipine, coreg, lasix, Hydralazine, minoxidil, verapamil - Recent increases in hydralazine and minoxidil - PRN medications for SBP > 180 - If still persistently elevated tomorrow, increase or add another agent.   Strep throat - Treated with amoxicillin  Acute urinary retention with spasms - Patient insistent that foley come out 7/15 - he is aware of need for bladder scanning and possibly I/O - Complaining of flank pain after foley out - Continue morphine PRN pain  Hyponatremia - Improving, 132 today  Hypokalemia - 3.1 today, replete with potassium, recheck in the AM   DVT prophylaxis: Heparin Code Status: Full Family Communication: None today Disposition Plan: Pending improvement in renal funciton   Consultants:   Nephrology  IR  Cardiology  Procedures:   Renal biopsy by IR 7/10  IR  placed R IJ TDC 7/10  Echocardiogram.  Antimicrobials:   Amoxicillin, completed course    Subjective: Reports that chest pain, nausea, vomiting is resolved.  He is having problems with foley, urge to urinate, pain at meatus and some bleeding at meatus.  He is aware of the issue of bladder outlet obstruction, but would like to try having the foley out.  I explained to him that this might mean needing intermittent in and out catheterization, and he stated that he would prefer I/O over chronic foley at this time.    Objective: Filed Vitals:   10/19/15 0535 10/19/15 1400 10/19/15 2042 10/20/15 0545  BP: 170/98 149/85 159/80 179/87  Pulse: 100 101 90 105  Temp: 97.7 F (36.5 C)  98 F (36.7 C) 97.9 F (36.6 C)  TempSrc: Oral  Oral Oral  Resp: 20  20 20   Height:      Weight:      SpO2: 99%  96% 98%    Intake/Output Summary (Last 24 hours) at 10/20/15 0950 Last data filed at 10/20/15 0913  Gross per 24 hour  Intake    360 ml  Output    650 ml  Net   -290 ml   Filed Weights   10/16/15 1459 10/16/15 2043 10/17/15 2029  Weight: 257 lb 11.5 oz (116.9 kg) 250 lb (113.399 kg) 250 lb 10.6 oz (113.7 kg)    Examination:  General exam: Appears calm and comfortable  Respiratory system: Clear to auscultation. Respiratory effort normal. Cardiovascular system: S1 & S2 heard, RR, NR.  Gastrointestinal system: Abdomen is nondistended, soft and nontender.Normal bowel sounds heard.  Central nervous system: Alert and oriented. No focal neurological deficits. GU: Foley in place, some dried blood at meatus Skin: No rashes, lesions or ulcers Psychiatry: Judgement and insight appear normal. Mood & affect appropriate.     Data Reviewed:  CBC:  Recent Labs Lab 10/15/15 1613 10/16/15 0727 10/17/15 0351 10/18/15 0536 10/19/15 0431  WBC 9.1 10.0 12.1* 14.2* 16.0*  HGB 12.4* 12.1* 11.8* 12.1* 12.0*  HCT 35.8* 34.7* 33.6* 33.6* 34.1*  MCV 83.4 83.2 82.0 82.0 82.0  PLT 218 233 272 349  399   Basic Metabolic Panel:  Recent Labs Lab 10/16/15 0727 10/17/15 0353 10/18/15 0536 10/19/15 0431 10/20/15 0320  NA 132* 133* 129* 127* 132*  K 3.7 3.4* 3.1* 3.5 3.1*  CL 92* 93* 84* 81* 83*  CO2 GLUCOSE 98 108* 121* 127* 86  BUN 110* 104* 112* 120* 126*  CREATININE 14.76* 13.47* 13.29* 13.67* 13.72*  CALCIUM 9.4 9.4 9.5 9.6 9.5  PHOS 11.8* 9.9* 10.3* 11.3* 11.8*   GFR: Estimated Creatinine Clearance: 10.3 mL/min (by C-G formula based on Cr of 13.72). Liver Function Tests:  Recent Labs Lab 10/16/15 0727 10/17/15 0353 10/18/15 0536 10/19/15 0431 10/20/15 0320  ALBUMIN 3.3* 3.3* 3.5 3.9 3.3*   No results for input(s): LIPASE, AMYLASE in the last 168 hours. No results for input(s): AMMONIA in the last 168 hours. Coagulation Profile:  Recent Labs Lab 10/14/15 1115  INR 1.15   Cardiac Enzymes:  Recent Labs Lab 10/18/15 1813 10/18/15 2227 10/19/15 0431 10/19/15 0812  TROPONINI 0.98* 0.95* 0.91* 0.88*   BNP (last 3 results) No results for input(s): PROBNP in the last 8760 hours. HbA1C: No results for input(s): HGBA1C in the last 72 hours. CBG: No results for input(s): GLUCAP in the last 168 hours. Lipid Profile:  Recent Labs  10/20/15 0320  CHOL 238*  HDL 31*  LDLCALC 158*  TRIG 244*  CHOLHDL 7.7      Radiology Studies: No results found.      Scheduled Meds: . amLODipine  10 mg Oral Daily  . carvedilol  25 mg Oral BID WC  . famotidine (PEPCID) IV  20 mg Intravenous Q24H  . furosemide  80 mg Oral BID  . heparin subcutaneous  5,000 Units Subcutaneous Q8H  . hydrALAZINE  100 mg Oral Q8H  . minoxidil  7.5 mg Oral BID  . pantoprazole  40 mg Oral Q0600  . phenazopyridine  200 mg Oral TID WC  . potassium chloride  20 mEq Oral Once  . sevelamer carbonate  1,600 mg Oral TID WC  . spironolactone  25 mg Oral Daily  . verapamil  120 mg Oral Daily   Continuous Infusions: . sodium chloride 100 mL/hr at 10/20/15 0913      LOS: 12 days    Time spent: 25 minutes    Debe Coder, MD [facility] Pager [phone]  If 7PM-7AM, please contact night-coverage [email or web address] Password [email or web address] 10/20/2015, 9:50 AM

## 2015-10-20 NOTE — Progress Notes (Signed)
PATIENT ID: Joseph Villegas is a 50M with malignant hypertension admitted with acute on chronic kidney failure and newly initiated HD.  Cardiology consulted for chest pain and elevated troponin.   SUBJECTIVE:  Feeling well this AM.  He has been walking and denies chest pain or SOB. No palpitations.   Marland Kitchen. amLODipine  10 mg Oral Daily  . carvedilol  25 mg Oral BID WC  . famotidine (PEPCID) IV  20 mg Intravenous Q24H  . furosemide  80 mg Oral BID  . heparin subcutaneous  5,000 Units Subcutaneous Q8H  . hydrALAZINE  100 mg Oral Q8H  . minoxidil  7.5 mg Oral BID  . pantoprazole  40 mg Oral Q0600  . phenazopyridine  200 mg Oral TID WC  . potassium chloride  20 mEq Oral Once  . sevelamer carbonate  1,600 mg Oral TID WC  . spironolactone  25 mg Oral Daily  . verapamil  120 mg Oral Daily   PHYSICAL EXAM Filed Vitals:   10/19/15 1400 10/19/15 2042 10/20/15 0545 10/20/15 0958  BP: 149/85 159/80 179/87 194/90  Pulse: 101 90 105 109  Temp:  98 F (36.7 C) 97.9 F (36.6 C) 97.8 F (36.6 C)  TempSrc:  Oral Oral Oral  Resp:  20 20 18   Height:      Weight:      SpO2:  96% 98% 98%   General:  Well-appearing.  No acute distress.  Neck: No JVD Lungs:  CTAB.  No crackles, rhonchi or wheezes Heart:  RRR.  III/VI systolic murmur at LLSB.  No r/g.  Abdomen:  Soft, NT, ND.  +BS Extremities:  Trace edema.  WWP.  LABS: Lab Results  Component Value Date   TROPONINI 0.88* 10/19/2015   Results for orders placed or performed during the hospital encounter of 10/08/15 (from the past 24 hour(s))  Renal function panel     Status: Abnormal   Collection Time: 10/20/15  3:20 AM  Result Value Ref Range   Sodium 132 (L) 135 - 145 mmol/L   Potassium 3.1 (L) 3.5 - 5.1 mmol/L   Chloride 83 (L) 101 - 111 mmol/L   CO2 25 22 - 32 mmol/L   Glucose, Bld 86 65 - 99 mg/dL   BUN 161126 (H) 6 - 20 mg/dL   Creatinine, Ser 09.6013.72 (H) 0.61 - 1.24 mg/dL   Calcium 9.5 8.9 - 45.410.3 mg/dL   Phosphorus 09.811.8 (H) 2.5 - 4.6  mg/dL   Albumin 3.3 (L) 3.5 - 5.0 g/dL   GFR calc non Af Amer 4 (L) >60 mL/min   GFR calc Af Amer 5 (L) >60 mL/min   Anion gap 24 (H) 5 - 15  Lipid panel     Status: Abnormal   Collection Time: 10/20/15  3:20 AM  Result Value Ref Range   Cholesterol 238 (H) 0 - 200 mg/dL   Triglycerides 119244 (H) <150 mg/dL   HDL 31 (L) >14>40 mg/dL   Total CHOL/HDL Ratio 7.7 RATIO   VLDL 49 (H) 0 - 40 mg/dL   LDL Cholesterol 782158 (H) 0 - 99 mg/dL    Intake/Output Summary (Last 24 hours) at 10/20/15 1012 Last data filed at 10/20/15 1001  Gross per 24 hour  Intake    480 ml  Output    950 ml  Net   -470 ml    EKG:  Sinus tachycardia rate 113.  LVH with repolarization abnormality.  Concern for prior lateral MI.   Echo: LVEF 65-70%.  Severe concentric LVH.  There is an LV intracavitary gradient due to LVH and hyperdynamic state.  Grade 1 diastolic dysfunction.  (my independent preliminary interpretation)   ASSESSMENT AND PLAN:  Principal Problem:   CKD (chronic kidney disease) Active Problems:   HTN (hypertension)   Acute kidney injury (HCC)   Thrombocytopenia (HCC)   Hematuria   Leukocytosis   Accelerated hypertension   Streptococcal sore throat   Post-streptococcal glomerulonephritis   # Chest pain: Symptoms seem to have been related to GERD.  Although his EKG does have changes, they are consistent with LVH with repolarization abnormality.  His echo is reassuring and does not reveal any wall motion abnormalities.  Troponin is elevated, but stable and he has AoCKD.  Given that he is 24 the likelihood of ACS is slim.  No cath given that he may have recovery of renal function.  Consider stress once he is over this acute phase.  Will check fasting lipids to risk stratify.  # Malignant hypertension:  # LV intracavitary gradient:  Blood pressure remains poorly-controlled on multiple agents.  He is not on an ACE-I/ARB as there is hope for renal recovery.  On echo he has severe LVH and an  intracavitary gradient.  He also has a murmur on exam.  This is due to his hypertension and LVH, not HCM.  The physiology is similar.  Verapamil  daily added yesterday, I would increase to 240 mg po daily and spironolactone 25 mg po daily.   Tobias Alexander, MD, [facility] 10/20/2015 10:12 AM

## 2015-10-20 NOTE — Progress Notes (Signed)
Patient ID: Joseph Villegas, male   DOB: November 29, 1990, 25 y.o.   MRN: [id number] S:feels a little light headed upon standing O:BP 179/87 mmHg  Pulse 105  Temp(Src) 97.9 F (36.6 C) (Oral)  Resp 20  Ht 5\' 10"  (1.778 m)  Wt 113.7 kg (250 lb 10.6 oz)  BMI 35.97 kg/m2  SpO2 98%  Intake/Output Summary (Last 24 hours) at 10/20/15 0904 Last data filed at 10/20/15 0546  Gross per 24 hour  Intake    120 ml  Output    650 ml  Net   -530 ml   Intake/Output: I/O last 3 completed shifts: In: 120 [P.O.:120] Out: 1250 [Urine:1250]  Intake/Output this shift:    Weight change:  Gen:WD WN AAM in NAD CVS:no rub Resp:cta WGN:FAOZHYAbd:benign Ext:no edema   Recent Labs Lab 10/13/15 1114 10/14/15 1115 10/15/15 0617 10/16/15 0727 10/17/15 0353 10/18/15 0536 10/19/15 0431 10/20/15 0320  NA 134* 134* 132* 132* 133* 129* 127* 132*  K 3.5 3.3* 3.6 3.7 3.4* 3.1* 3.5 3.1*  CL 98* 97* 94* 92* 93* 84* 81* 83*  CO2 22 21* 22 22 23 25 25 25   GLUCOSE 85 112* 107* 98 108* 121* 127* 86  BUN 78* 84* 93* 110* 104* 112* 120* 126*  CREATININE 10.11* 10.82* 12.09* 14.76* 13.47* 13.29* 13.67* 13.72*  ALBUMIN 3.1* 3.4*  --  3.3* 3.3* 3.5 3.9 3.3*  CALCIUM 9.2 9.6 9.7 9.4 9.4 9.5 9.6 9.5  PHOS 7.1* 7.1*  --  11.8* 9.9* 10.3* 11.3* 11.8*   Liver Function Tests:  Recent Labs Lab 10/18/15 0536 10/19/15 0431 10/20/15 0320  ALBUMIN 3.5 3.9 3.3*   No results for input(s): LIPASE, AMYLASE in the last 168 hours. No results for input(s): AMMONIA in the last 168 hours. CBC:  Recent Labs Lab 10/15/15 1613 10/16/15 0727 10/17/15 0351 10/18/15 0536 10/19/15 0431  WBC 9.1 10.0 12.1* 14.2* 16.0*  HGB 12.4* 12.1* 11.8* 12.1* 12.0*  HCT 35.8* 34.7* 33.6* 33.6* 34.1*  MCV 83.4 83.2 82.0 82.0 82.0  PLT 218 233 272 349 399   Cardiac Enzymes:  Recent Labs Lab 10/18/15 1813 10/18/15 2227 10/19/15 0431 10/19/15 0812  TROPONINI 0.98* 0.95* 0.91* 0.88*   CBG: No results for input(s): GLUCAP in the last 168  hours.  Iron Studies: No results for input(s): IRON, TIBC, TRANSFERRIN, FERRITIN in the last 72 hours. Studies/Results: No results found. Marland Kitchen. amLODipine  10 mg Oral Daily  . carvedilol  25 mg Oral BID WC  . famotidine (PEPCID) IV  20 mg Intravenous Q24H  . furosemide  80 mg Oral BID  . heparin subcutaneous  5,000 Units Subcutaneous Q8H  . hydrALAZINE  100 mg Oral Q8H  . minoxidil  7.5 mg Oral BID  . pantoprazole  40 mg Oral Q0600  . phenazopyridine  200 mg Oral TID WC  . sevelamer carbonate  1,600 mg Oral TID WC  . spironolactone  25 mg Oral Daily  . verapamil  120 mg Oral Daily    BMET    Component Value Date/Time   NA 132* 10/20/2015 0320   K 3.1* 10/20/2015 0320   CL 83* 10/20/2015 0320   CO2 25 10/20/2015 0320   GLUCOSE 86 10/20/2015 0320   BUN 126* 10/20/2015 0320   CREATININE 13.72* 10/20/2015 0320   CALCIUM 9.5 10/20/2015 0320   GFRNONAA 4* 10/20/2015 0320   GFRAA 5* 10/20/2015 0320   CBC    Component Value Date/Time   WBC 16.0* 10/19/2015 0431   RBC 4.16* 10/19/2015  0431   HGB 12.0* 10/19/2015 0431   HCT 34.1* 10/19/2015 0431   PLT 399 10/19/2015 0431   MCV 82.0 10/19/2015 0431   MCH 28.8 10/19/2015 0431   MCHC 35.2 10/19/2015 0431   RDW 13.5 10/19/2015 0431   LYMPHSABS 1.5 10/08/2015 0920   MONOABS 0.7 10/08/2015 0920   EOSABS 0.0 10/08/2015 0920   BASOSABS 0.0 10/08/2015 0920     Assessment/Plan:  1. 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 Negative GN work up 2. Possibility of recovery 3. Continue to hold off on further HD for now and follow closely 4. No uremic symptoms at this time. 5. BUN rising out of proportion of Scr will increase IVF's, decrease lasix and follow UOP and Scr. 2. Malignant HTN- still not at goal. Recent increase of minoxidil and resumption of hydralazine. 3. hyperphosphatemia  [name] Maddy Graham

## 2015-10-21 LAB — CBC
HCT: 25.2 % — ABNORMAL LOW (ref 39.0–52.0)
Hemoglobin: 8.8 g/dL — ABNORMAL LOW (ref 13.0–17.0)
MCH: 28.4 pg (ref 26.0–34.0)
MCHC: 34.9 g/dL (ref 30.0–36.0)
MCV: 81.3 fL (ref 78.0–100.0)
PLATELETS: 422 10*3/uL — AB (ref 150–400)
RBC: 3.1 MIL/uL — ABNORMAL LOW (ref 4.22–5.81)
RDW: 13.1 % (ref 11.5–15.5)
WBC: 16.3 10*3/uL — ABNORMAL HIGH (ref 4.0–10.5)

## 2015-10-21 LAB — RENAL FUNCTION PANEL
ALBUMIN: 3.2 g/dL — AB (ref 3.5–5.0)
ANION GAP: 18 — AB (ref 5–15)
BUN: 131 mg/dL — AB (ref 6–20)
CALCIUM: 8.8 mg/dL — AB (ref 8.9–10.3)
CO2: 23 mmol/L (ref 22–32)
Chloride: 88 mmol/L — ABNORMAL LOW (ref 101–111)
Creatinine, Ser: 13.57 mg/dL — ABNORMAL HIGH (ref 0.61–1.24)
GFR calc Af Amer: 5 mL/min — ABNORMAL LOW (ref >60)
GFR calc non Af Amer: 4 mL/min — ABNORMAL LOW (ref >60)
GLUCOSE: 96 mg/dL (ref 65–99)
PHOSPHORUS: 10.1 mg/dL — AB (ref 2.5–4.6)
Potassium: 3.3 mmol/L — ABNORMAL LOW (ref 3.5–5.1)
SODIUM: 129 mmol/L — AB (ref 135–145)

## 2015-10-21 MED ORDER — POTASSIUM CHLORIDE CRYS ER 20 MEQ PO TBCR
20.0000 meq | EXTENDED_RELEASE_TABLET | Freq: Once | ORAL | Status: AC
  Administered 2015-10-21: 20 meq via ORAL
  Filled 2015-10-21: qty 1

## 2015-10-21 MED ORDER — CLONIDINE HCL 0.1 MG PO TABS
0.1000 mg | ORAL_TABLET | Freq: Two times a day (BID) | ORAL | Status: DC
  Administered 2015-10-21 – 2015-10-30 (×18): 0.1 mg via ORAL
  Filled 2015-10-21 (×17): qty 1

## 2015-10-21 NOTE — Progress Notes (Signed)
Bladder scan showing 245cc urine.Patient unable to void at present time

## 2015-10-21 NOTE — Progress Notes (Signed)
Patient voided 250cc urine,repeat bladder scan showing 12cc urine.

## 2015-10-21 NOTE — Progress Notes (Addendum)
PROGRESS NOTE    Joseph Villegas  ZOX:[id number] DOB: 02/16/1991 DOA: 10/08/2015 No PCP  Brief Narrative:  25 y.o. year-old with HTN, 1 week history of fever, cough, sore throat, weakness. His evaluation there was significant for hypertension (204/152), leukocytosis to 17,000, mild thrombocytopenia of 120K, creatinine of 7.17 and baseline creatinine not known. Urinalysis revealed >300 protein, large blood, TNTC RBC's. Rapid strep test +. Nephrology consulted.   7/14 pts echocardiogram unremarkable. Urinary retention, foley catheter placed.   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 - Renal ultrasound suggested CKD from HTN and ATN - Renal biopsy done 7/10 - results pending - W/U for GN negative - HD tomorrow, still with possibility of recovery - Nephrology following - Decrease lasix to  BID - uremia worsening  Accelerated HTN with urgency - BP today still significantly high, with systolic ranging from 140s - 190s - On amlodipine, coreg, lasix, Hydralazine, minoxidil, verapamil - Add clonidine 0.1 BID  Strep throat - Treated with amoxicillin  Acute urinary retention with spasms - Patient insistent that foley come out 7/15 - he is aware of need for bladder scanning and possibly I/O - Continue morphine PRN pain - Sx improved today  Hyponatremia - Stable and mild, likely due to renal failure  Hypokalemia - 3.3 today, replete with potassium, recheck in the AM  Leukocytosis - Unclear cause, improved initially and now back to 16 - He is afebrile and without acute findings concerning for infection - Check CBC with diff in the AM - Culture for fever.    DVT prophylaxis: Heparin Code Status: Full Family Communication: None today Disposition Plan: Pending improvement in renal funciton   Consultants:   Nephrology  IR  Cardiology  Procedures:    Renal biopsy by IR 7/10  IR placed R IJ TDC 7/10  Echocardiogram.  Antimicrobials:   Amoxicillin, completed course    Subjective: Doing well, some nausea.  Aware he will undergo dialysis tomorrow.   Objective: Filed Vitals:   10/20/15 1747 10/20/15 2122 10/21/15 0341 10/21/15 0853  BP: 109/47 131/51 155/62 170/82  Pulse: 83 92 111 114  Temp: 98.1 F (36.7 C) 97.7 F (36.5 C) 97.8 F (36.6 C) 97.7 F (36.5 C)  TempSrc: Oral Oral Oral Oral  Resp: Height:      Weight:      SpO2: 96% 94% 97% 98%    Intake/Output Summary (Last 24 hours) at 10/21/15 1647 Last data filed at 10/21/15 1300  Gross per 24 hour  Intake   1180 ml  Output    875 ml  Net    305 ml   Filed Weights   10/16/15 1459 10/16/15 2043 10/17/15 2029  Weight: 257 lb 11.5 oz (116.9 kg) 250 lb (113.399 kg) 250 lb 10.6 oz (113.7 kg)    Examination:  General exam: Appears calm and comfortable  Eyes: EOMI, anicteric sclerae Respiratory system: Respiratory effort normal. No audible wheezing Cardiovascular system: RR, NR. No murmur Gastrointestinal system: Abdomen is nondistended, soft and nontender.  +BS Central nervous system: Alert and oriented. No focal neurological deficits. Skin: No new rashes, lesions or ulcers Psychiatry: Judgement and insight appear normal. Mood & affect appropriate.     Data Reviewed:  CBC:  Recent Labs Lab 10/16/15 0727 10/17/15 0351 10/18/15 0536 10/19/15 0431 10/21/15 0348  WBC 10.0 12.1* 14.2* 16.0*  16.3*  HGB 12.1* 11.8* 12.1* 12.0* 8.8*  HCT 34.7* 33.6* 33.6* 34.1* 25.2*  MCV 83.2 82.0 82.0 82.0 81.3  PLT 233 272 349 399 422*   Basic Metabolic Panel:  Recent Labs Lab 10/17/15 0353 10/18/15 0536 10/19/15 0431 10/20/15 0320 10/21/15 0348  NA 133* 129* 127* 132* 129*  K 3.4* 3.1* 3.5 3.1* 3.3*  CL 93* 84* 81* 83* 88*  CO2 23 25 25 25 23   GLUCOSE 108* 121* 127* 86 96  BUN 104* 112* 120* 126* 131*  CREATININE 13.47* 13.29* 13.67*  13.72* 13.57*  CALCIUM 9.4 9.5 9.6 9.5 8.8*  PHOS 9.9* 10.3* 11.3* 11.8* 10.1*   GFR: Estimated Creatinine Clearance: 10.4 mL/min (by C-G formula based on Cr of 13.57). Liver Function Tests:  Recent Labs Lab 10/17/15 0353 10/18/15 0536 10/19/15 0431 10/20/15 0320 10/21/15 0348  ALBUMIN 3.3* 3.5 3.9 3.3* 3.2*   No results for input(s): LIPASE, AMYLASE in the last 168 hours. No results for input(s): AMMONIA in the last 168 hours. Coagulation Profile: No results for input(s): INR, PROTIME in the last 168 hours. Cardiac Enzymes:  Recent Labs Lab 10/18/15 1813 10/18/15 2227 10/19/15 0431 10/19/15 0812  TROPONINI 0.98* 0.95* 0.91* 0.88*   BNP (last 3 results) No results for input(s): PROBNP in the last 8760 hours. HbA1C: No results for input(s): HGBA1C in the last 72 hours. CBG: No results for input(s): GLUCAP in the last 168 hours. Lipid Profile:  Recent Labs  10/20/15 0320  CHOL 238*  HDL 31*  LDLCALC 158*  TRIG 244*  CHOLHDL 7.7      Radiology Studies: No results found.      Scheduled Meds: . amLODipine  10 mg Oral Daily  . carvedilol  25 mg Oral BID WC  . famotidine  20 mg Oral QHS  . furosemide  80 mg Oral BID  . heparin subcutaneous  5,000 Units Subcutaneous Q8H  . hydrALAZINE  100 mg Oral Q8H  . minoxidil  7.5 mg Oral BID  . pantoprazole  40 mg Oral Q0600  . sevelamer carbonate  1,600 mg Oral TID WC  . spironolactone  25 mg Oral Daily  . verapamil  240 mg Oral Daily   Continuous Infusions: . sodium chloride 100 mL/hr at 10/21/15 1358     LOS: 13 days    Time spent: 25 minutes    Debe CoderMULLEN, Griselda Bramblett, MD [facility] Pager [phone]  If 7PM-7AM, please contact night-coverage [email or web address] Password [email or web address] 10/21/2015, 4:47 PM

## 2015-10-21 NOTE — Progress Notes (Signed)
Patient ID: Joseph PetrinLaquan Villegas, male   DOB: 12-Dec-1990, 25 y.o.   MRN: [id number] S:c/o nausea O:BP 170/82 mmHg  Pulse 114  Temp(Src) 97.7 F (36.5 C) (Oral)  Resp 18  Ht 5\' 10"  (1.778 m)  Wt 113.7 kg (250 lb 10.6 oz)  BMI 35.97 kg/m2  SpO2 98%  Intake/Output Summary (Last 24 hours) at 10/21/15 0945 Last data filed at 10/21/15 0853  Gross per 24 hour  Intake   1540 ml  Output    975 ml  Net    565 ml   Intake/Output: I/O last 3 completed shifts: In: 1780 [P.O.:1780] Out: 1025 [Urine:1000; Emesis/NG output:25]  Intake/Output this shift:  Total I/O In: 120 [P.O.:120] Out: 200 [Urine:200] Weight change:  Gen:WD WN AAM in NAD CVS:no rub Resp:occ rhonchi WUJ:WJXBJYAbd:benign Ext:no edema   Recent Labs Lab 10/14/15 1115 10/15/15 0617 10/16/15 0727 10/17/15 0353 10/18/15 0536 10/19/15 0431 10/20/15 0320 10/21/15 0348  NA 134* 132* 132* 133* 129* 127* 132* 129*  K 3.3* 3.6 3.7 3.4* 3.1* 3.5 3.1* 3.3*  CL 97* 94* 92* 93* 84* 81* 83* 88*  CO2 21* 22 22 23 25 25 25 23   GLUCOSE 112* 107* 98 108* 121* 127* 86 96  BUN 84* 93* 110* 104* 112* 120* 126* 131*  CREATININE 10.82* 12.09* 14.76* 13.47* 13.29* 13.67* 13.72* 13.57*  ALBUMIN 3.4*  --  3.3* 3.3* 3.5 3.9 3.3* 3.2*  CALCIUM 9.6 9.7 9.4 9.4 9.5 9.6 9.5 8.8*  PHOS 7.1*  --  11.8* 9.9* 10.3* 11.3* 11.8* 10.1*   Liver Function Tests:  Recent Labs Lab 10/19/15 0431 10/20/15 0320 10/21/15 0348  ALBUMIN 3.9 3.3* 3.2*   No results for input(s): LIPASE, AMYLASE in the last 168 hours. No results for input(s): AMMONIA in the last 168 hours. CBC:  Recent Labs Lab 10/16/15 0727 10/17/15 0351 10/18/15 0536 10/19/15 0431 10/21/15 0348  WBC 10.0 12.1* 14.2* 16.0* 16.3*  HGB 12.1* 11.8* 12.1* 12.0* 8.8*  HCT 34.7* 33.6* 33.6* 34.1* 25.2*  MCV 83.2 82.0 82.0 82.0 81.3  PLT 233 272 349 399 422*   Cardiac Enzymes:  Recent Labs Lab 10/18/15 1813 10/18/15 2227 10/19/15 0431 10/19/15 0812  TROPONINI 0.98* 0.95* 0.91* 0.88*    CBG: No results for input(s): GLUCAP in the last 168 hours.  Iron Studies: No results for input(s): IRON, TIBC, TRANSFERRIN, FERRITIN in the last 72 hours. Studies/Results: No results found. Marland Kitchen. amLODipine  10 mg Oral Daily  . carvedilol  25 mg Oral BID WC  . famotidine  20 mg Oral QHS  . furosemide  80 mg Oral BID  . heparin subcutaneous  5,000 Units Subcutaneous Q8H  . hydrALAZINE  100 mg Oral Q8H  . minoxidil  7.5 mg Oral BID  . pantoprazole  40 mg Oral Q0600  . phenazopyridine  200 mg Oral TID WC  . sevelamer carbonate  1,600 mg Oral TID WC  . spironolactone  25 mg Oral Daily  . verapamil  240 mg Oral Daily    BMET    Component Value Date/Time   NA 129* 10/21/2015 0348   K 3.3* 10/21/2015 0348   CL 88* 10/21/2015 0348   CO2 23 10/21/2015 0348   GLUCOSE 96 10/21/2015 0348   BUN 131* 10/21/2015 0348   CREATININE 13.57* 10/21/2015 0348   CALCIUM 8.8* 10/21/2015 0348   GFRNONAA 4* 10/21/2015 0348   GFRAA 5* 10/21/2015 0348   CBC    Component Value Date/Time   WBC 16.3* 10/21/2015 0348   RBC  3.10* 10/21/2015 0348   HGB 8.8* 10/21/2015 0348   HCT 25.2* 10/21/2015 0348   PLT 422* 10/21/2015 0348   MCV 81.3 10/21/2015 0348   MCH 28.4 10/21/2015 0348   MCHC 34.9 10/21/2015 0348   RDW 13.1 10/21/2015 0348   LYMPHSABS 1.5 10/08/2015 0920   MONOABS 0.7 10/08/2015 0920   EOSABS 0.0 10/08/2015 0920   BASOSABS 0.0 10/08/2015 0920     Assessment/Plan:  1. 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  Last HD session was on 10/16/15  1. Negative GN work up 2. Possibility of recovery however worsening prerenal azotemia.   3. Will plan for another session of HD tomorrow for mild uremic symptoms and continue to follow closely for recovery 4. BUN rising out of proportion of Scr and did not respont to increased IVF's, decreased lasix, HD tomorrow as above 5. Continue to follow UOP and Scr. 2. Malignant HTN- still not at goal. Recent increase  of minoxidil and resumption of hydralazine.  Consider clonidine 0.1mg  bid 3. hyperphosphatemia  [name] [name]

## 2015-10-22 ENCOUNTER — Encounter (HOSPITAL_COMMUNITY): Payer: Self-pay

## 2015-10-22 ENCOUNTER — Inpatient Hospital Stay (HOSPITAL_COMMUNITY): Payer: Medicaid Other

## 2015-10-22 DIAGNOSIS — N189 Chronic kidney disease, unspecified: Secondary | ICD-10-CM

## 2015-10-22 DIAGNOSIS — N179 Acute kidney failure, unspecified: Secondary | ICD-10-CM | POA: Insufficient documentation

## 2015-10-22 LAB — RENAL FUNCTION PANEL
ALBUMIN: 3.1 g/dL — AB (ref 3.5–5.0)
ANION GAP: 16 — AB (ref 5–15)
BUN: 128 mg/dL — ABNORMAL HIGH (ref 6–20)
CALCIUM: 8.6 mg/dL — AB (ref 8.9–10.3)
CO2: 21 mmol/L — ABNORMAL LOW (ref 22–32)
CREATININE: 13.35 mg/dL — AB (ref 0.61–1.24)
Chloride: 92 mmol/L — ABNORMAL LOW (ref 101–111)
GFR calc non Af Amer: 4 mL/min — ABNORMAL LOW (ref >60)
GFR, EST AFRICAN AMERICAN: 5 mL/min — AB (ref >60)
GLUCOSE: 102 mg/dL — AB (ref 65–99)
PHOSPHORUS: 8.7 mg/dL — AB (ref 2.5–4.6)
Potassium: 3.2 mmol/L — ABNORMAL LOW (ref 3.5–5.1)
SODIUM: 129 mmol/L — AB (ref 135–145)

## 2015-10-22 LAB — CBC WITH DIFFERENTIAL/PLATELET
BASOS ABS: 0 10*3/uL (ref 0.0–0.1)
BASOS PCT: 0 %
EOS ABS: 0 10*3/uL (ref 0.0–0.7)
Eosinophils Relative: 0 %
HCT: 22.7 % — ABNORMAL LOW (ref 39.0–52.0)
HEMOGLOBIN: 7.8 g/dL — AB (ref 13.0–17.0)
LYMPHS PCT: 13 %
Lymphs Abs: 1.8 10*3/uL (ref 0.7–4.0)
MCH: 28.3 pg (ref 26.0–34.0)
MCHC: 34.4 g/dL (ref 30.0–36.0)
MCV: 82.2 fL (ref 78.0–100.0)
MONO ABS: 0.7 10*3/uL (ref 0.1–1.0)
Monocytes Relative: 5 %
NEUTROS ABS: 11.1 10*3/uL — AB (ref 1.7–7.7)
NEUTROS PCT: 82 %
PLATELETS: 357 10*3/uL (ref 150–400)
RBC: 2.76 MIL/uL — ABNORMAL LOW (ref 4.22–5.81)
RDW: 13.3 % (ref 11.5–15.5)
WBC: 13.6 10*3/uL — ABNORMAL HIGH (ref 4.0–10.5)

## 2015-10-22 MED ORDER — ONDANSETRON HCL 4 MG/2ML IJ SOLN
INTRAMUSCULAR | Status: AC
  Filled 2015-10-22: qty 2

## 2015-10-22 NOTE — Progress Notes (Signed)
   10/22/15 1343  Clinical Encounter Type  Visited With Patient  Visit Type Initial  Referral From Nurse  Consult/Referral To Chaplain  Spiritual Encounters  Spiritual Needs Emotional  Stress Factors  Patient Stress Factors Health changes;Loss of control  Chaplain visit made per nurse, patient appears down r/t health issues, expresses not want ing to talk at this time, provided emotional support and validation of feelings. Advised that Chaplain will be available for further support services 24/7 as needed.

## 2015-10-22 NOTE — Progress Notes (Addendum)
PROGRESS NOTE    Joseph PetrinLaquan Villegas  ZOX:[id number] DOB: Jun 13, 1990 DOA: 10/08/2015 No PCP  Brief Narrative:  25 y.o. year-old with HTN, 1 week history of fever, cough, sore throat, weakness. His evaluation there was significant for hypertension (204/152), leukocytosis to 17,000, mild thrombocytopenia of 120K, creatinine of 7.17 and baseline creatinine not known. Urinalysis revealed >300 protein, large blood, TNTC RBC's. Rapid strep test +. Nephrology consulted.   7/14 pts echocardiogram unremarkable. Urinary retention, foley catheter placed.   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 He also has a murmur on exam. This is due to his hypertension and LVH, not HCM. The physiology is similar. Verapamil increased to 240 mg po daily and spironolactone 25 mg po daily  Chest pain: Symptoms seem to have been related to GERD. Although his EKG does have changes, they are consistent with LVH with repolarization abnormality. His echo is reassuring and does not reveal any wall motion abnormalities. Troponin is elevated, but stable and he has AoCKD. Given that he is 24 the likelihood of ACS is slim troponins  remained in the range of 0.8-0.95  ECG with LVH. BP improved on Rx continue dialysis will arrange outpatient F/u with Dr Duke Salviaandolph consider myovue in 6-8 weeks as per Wendall StadePeter C  Nishan, MD   Strep throat - Treated with amoxicillin  Acute urinary retention with spasms - Patient insistent that foley come out 7/15 - he is aware of need for bladder scanning and possibly I/O - Continue morphine PRN pain - Sx improved today  Hyponatremia - Stable and mild, likely due to renal failure  Hypokalemia - 3.2 today, replete with potassium, recheck in the AM  Leukocytosis - Unclear cause, improved initially and now 13.6 - He is afebrile and without acute findings concerning for infection - Check CBC with diff in the AM - Culture for fever.    DVT prophylaxis: Heparin Code Status: Full Family Communication: None today Disposition Plan: Disposition as per nephrology   Consultants:   Nephrology  IR  Cardiology  Procedures:   Renal biopsy by IR 7/10  IR placed R IJ TDC 7/10  Echocardiogram.  Antimicrobials:   Amoxicillin, completed course    Subjective: Patient denies any nausea vomiting abdominal pain   Objective: Filed Vitals:   10/22/15 0913 10/22/15 0920 10/22/15 0930 10/22/15 1042  BP: 150/71  161/79 163/71  Pulse: 110  113 73  Temp:      TempSrc:      Resp: 19  26   Height:      Weight:      SpO2:  89% 97%     Intake/Output Summary (Last 24 hours) at 10/22/15 1120 Last data filed at 10/22/15 1105  Gross per 24 hour  Intake   1080 ml  Output    507 ml  Net    573 ml   [employer] PowerFiled Weights  10/16/15 2043 10/17/15 2029 10/22/15 0720  Weight: 113.399 kg (250 lb) 113.7 kg (250 lb 10.6 oz) 117.5 kg (259 lb 0.7 oz)    Examination:  General exam: Appears calm and comfortable  Eyes: EOMI, anicteric sclerae Respiratory system: Respiratory effort normal. No audible wheezing Cardiovascular system: RR, NR. No murmur Gastrointestinal system: Abdomen is nondistended, soft and nontender.  +BS Central nervous system: Alert and oriented. No focal neurological deficits. Skin: No new rashes, lesions or ulcers Psychiatry: Judgement and  insight appear normal. Mood & affect appropriate.     Data Reviewed:  CBC:  Recent Labs Lab 10/17/15 0351 10/18/15 0536 10/19/15 0431 10/21/15 0348 10/22/15 0353  WBC 12.1* 14.2* 16.0* 16.3* 13.6*  NEUTROABS  --   --   --   --  11.1*  HGB 11.8* 12.1* 12.0* 8.8* 7.8*  HCT 33.6* 33.6* 34.1* 25.2* 22.7*  MCV 82.0 82.0 82.0 81.3 82.2  PLT 272 349 399 422* 357   Basic Metabolic Panel:  Recent Labs Lab 10/18/15 0536 10/19/15 0431 10/20/15 0320 10/21/15 0348 10/22/15 0353  NA 129* 127* 132* 129* 129*  K 3.1* 3.5 3.1* 3.3* 3.2*  CL 84* 81* 83* 88* 92*  CO2 21*  GLUCOSE 121* 127* 86 96 102*  BUN 112* 120* 126* 131* 128*  CREATININE 13.29* 13.67* 13.72* 13.57* 13.35*  CALCIUM 9.5 9.6 9.5 8.8* 8.6*  PHOS 10.3* 11.3* 11.8* 10.1* 8.7*   GFR: Estimated Creatinine Clearance: 10.8 mL/min (by C-G formula based on Cr of 13.35). Liver Function Tests:  Recent Labs Lab 10/18/15 0536 10/19/15 0431 10/20/15 0320 10/21/15 0348 10/22/15 0353  ALBUMIN 3.5 3.9 3.3* 3.2* 3.1*   No results for input(s): LIPASE, AMYLASE in the last 168 hours. No results for input(s): AMMONIA in the last 168 hours. Coagulation Profile: No results for input(s): INR, PROTIME in the last 168 hours. Cardiac Enzymes:  Recent Labs Lab 10/18/15 1813 10/18/15 2227 10/19/15 0431 10/19/15 0812  TROPONINI 0.98* 0.95* 0.91* 0.88*   BNP (last 3 results) No results for input(s): PROBNP in the last 8760 hours. HbA1C: No results for input(s): HGBA1C in the last 72 hours. CBG: No results for input(s): GLUCAP in the last 168 hours. Lipid Profile:  Recent Labs  10/20/15 0320  CHOL 238*  HDL 31*  LDLCALC 158*  TRIG 244*  CHOLHDL 7.7      Radiology Studies: No results found.      Scheduled Meds: . amLODipine  10 mg Oral Daily  . carvedilol  25 mg Oral BID WC  . cloNIDine  0.1 mg Oral BID  . famotidine  20 mg Oral QHS  . furosemide  80 mg Oral BID  . heparin subcutaneous   5,000 Units Subcutaneous Q8H  . hydrALAZINE  100 mg Oral Q8H  . minoxidil  7.5 mg Oral BID  . ondansetron      . pantoprazole  40 mg Oral Q0600  . sevelamer carbonate  1,600 mg Oral TID WC  . spironolactone  25 mg Oral Daily  . verapamil  240 mg Oral Daily   Continuous Infusions: . sodium chloride 1,000 mL (10/22/15 0704)     LOS: 14 days    Time spent: 25 minutes    Richarda Overlie, MD [facility] Pager [phone]  If 7PM-7AM, please contact night-coverage [email or web address] Password [email or web address] 10/22/2015, 11:20 AM

## 2015-10-22 NOTE — Progress Notes (Signed)
Pt c/o chest pain at cath site, right side non radiating vss, medicated c 2mg  morphine. Now with c/o having hard time breathing  Will place oxygen 2 lnc for comfort.  REfusing cocktail  So will give  2vicodin  For added pain control

## 2015-10-22 NOTE — Progress Notes (Signed)
Patient ID: Joseph Villegas, male   DOB: Mar 31, 1991, 25 y.o.   MRN: [id number] S:Pt c/o of chest tightness while on HD.  BP's dropped from 170's to 150's.  Decreased bfr and given oxygen.   O:BP 150/71 mmHg  Pulse 110  Temp(Src) 98.2 F (36.8 C) (Oral)  Resp 19  Ht 5\' 10"  (1.778 m)  Wt 117.5 kg (259 lb 0.7 oz)  BMI 37.17 kg/m2  SpO2 95%  Intake/Output Summary (Last 24 hours) at 10/22/15 0927 Last data filed at 10/22/15 0442  Gross per 24 hour  Intake   1080 ml  Output    590 ml  Net    490 ml   Intake/Output: I/O last 3 completed shifts: In: 1780 [P.O.:1780] Out: 1265 [Urine:1240; Emesis/NG output:25]  Intake/Output this shift:    Weight change:  Gen:WD WN AAM in mild distress WUJ:WJXBJCVS:tachy Resp:cta YNW:GNFAOZAbd:benign Ext:no edema   Recent Labs Lab 10/16/15 0727 10/17/15 0353 10/18/15 0536 10/19/15 0431 10/20/15 0320 10/21/15 0348 10/22/15 0353  NA 132* 133* 129* 127* 132* 129* 129*  K 3.7 3.4* 3.1* 3.5 3.1* 3.3* 3.2*  CL 92* 93* 84* 81* 83* 88* 92*  CO2 22 23 25 25 25 23  21*  GLUCOSE 98 108* 121* 127* 86 96 102*  BUN 110* 104* 112* 120* 126* 131* 128*  CREATININE 14.76* 13.47* 13.29* 13.67* 13.72* 13.57* 13.35*  ALBUMIN 3.3* 3.3* 3.5 3.9 3.3* 3.2* 3.1*  CALCIUM 9.4 9.4 9.5 9.6 9.5 8.8* 8.6*  PHOS 11.8* 9.9* 10.3* 11.3* 11.8* 10.1* 8.7*   Liver Function Tests:  Recent Labs Lab 10/20/15 0320 10/21/15 0348 10/22/15 0353  ALBUMIN 3.3* 3.2* 3.1*   No results for input(s): LIPASE, AMYLASE in the last 168 hours. No results for input(s): AMMONIA in the last 168 hours. CBC:  Recent Labs Lab 10/17/15 0351 10/18/15 0536 10/19/15 0431 10/21/15 0348 10/22/15 0353  WBC 12.1* 14.2* 16.0* 16.3* 13.6*  NEUTROABS  --   --   --   --  11.1*  HGB 11.8* 12.1* 12.0* 8.8* 7.8*  HCT 33.6* 33.6* 34.1* 25.2* 22.7*  MCV 82.0 82.0 82.0 81.3 82.2  PLT 272 349 399 422* 357   Cardiac Enzymes:  Recent Labs Lab 10/18/15 1813 10/18/15 2227 10/19/15 0431 10/19/15 0812   TROPONINI 0.98* 0.95* 0.91* 0.88*   CBG: No results for input(s): GLUCAP in the last 168 hours.  Iron Studies: No results for input(s): IRON, TIBC, TRANSFERRIN, FERRITIN in the last 72 hours. Studies/Results: No results found. Marland Kitchen. amLODipine  10 mg Oral Daily  . carvedilol  25 mg Oral BID WC  . cloNIDine  0.1 mg Oral BID  . famotidine  20 mg Oral QHS  . furosemide  80 mg Oral BID  . heparin subcutaneous  5,000 Units Subcutaneous Q8H  . hydrALAZINE  100 mg Oral Q8H  . minoxidil  7.5 mg Oral BID  . ondansetron      . pantoprazole  40 mg Oral Q0600  . sevelamer carbonate  1,600 mg Oral TID WC  . spironolactone  25 mg Oral Daily  . verapamil  240 mg Oral Daily    BMET    Component Value Date/Time   NA 129* 10/22/2015 0353   K 3.2* 10/22/2015 0353   CL 92* 10/22/2015 0353   CO2 21* 10/22/2015 0353   GLUCOSE 102* 10/22/2015 0353   BUN 128* 10/22/2015 0353   CREATININE 13.35* 10/22/2015 0353   CALCIUM 8.6* 10/22/2015 0353   GFRNONAA 4* 10/22/2015 0353   GFRAA 5* 10/22/2015  0353   CBC    Component Value Date/Time   WBC 13.6* 10/22/2015 0353   RBC 2.76* 10/22/2015 0353   HGB 7.8* 10/22/2015 0353   HCT 22.7* 10/22/2015 0353   PLT 357 10/22/2015 0353   MCV 82.2 10/22/2015 0353   MCH 28.3 10/22/2015 0353   MCHC 34.4 10/22/2015 0353   RDW 13.3 10/22/2015 0353   LYMPHSABS 1.8 10/22/2015 0353   MONOABS 0.7 10/22/2015 0353   EOSABS 0.0 10/22/2015 0353   BASOSABS 0.0 10/22/2015 0353     Assessment/Plan:  1. 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 Last HD session was on 10/16/15  1. Negative GN work up 2. Possibility of recovery however worsening prerenal azotemia.  3. Will plan for another session of HD today due to persistence of mild uremic symptoms 4. Continue to follow closely for recovery 5. BUN rising out of proportion of Scr and did not respont to increased IVF's, decreased lasix, HD as above 2. Malignant HTN- still not at  goal. Recent increase of minoxidil and resumption of hydralazine. Consider clonidine 0.1mg  bid 3. Hyperphosphatemia 4. Chest tightness with HD- will need to repeat CXR to evaluate HD catheter placement.  ECHO was without pericardial effusion.  Continue with HD and no UF.  [name] Thedora Rings

## 2015-10-22 NOTE — Progress Notes (Signed)
Patient Name: Joseph Villegas Date of Encounter: 10/22/2015  Principal Problem:   CKD (chronic kidney disease) Active Problems:   HTN (hypertension)   Acute kidney injury (HCC)   Thrombocytopenia (HCC)   Hematuria   Leukocytosis   Accelerated hypertension   Streptococcal sore throat   Post-streptococcal glomerulonephritis   Essential hypertension   Primary Cardiologist: New  Patient Profile: Joseph Villegas is a 28M with malignant hypertension admitted with acute on chronic kidney failure and newly initiated HD. Cardiology consulted for chest pain and elevated troponin. His symptoms resolved with in/out foley catheterization and GERD treatment.   SUBJECTIVE: In HD now, feeling chest tightness and nausea. Chest tightness only occurs with HD according to patient.    OBJECTIVE Filed Vitals:   10/22/15 0727 10/22/15 0734 10/22/15 0800 10/22/15 0830  BP: 176/83 159/74 158/75 151/74  Pulse: 106  110 112  Temp:      TempSrc:      Resp: Height:      Weight:      SpO2:        Intake/Output Summary (Last 24 hours) at 10/22/15 0903 Last data filed at 10/22/15 0442  Gross per 24 hour  Intake   1080 ml  Output    590 ml  Net    490 ml   Filed Weights   10/16/15 2043 10/17/15 2029 10/22/15 0720  Weight: 250 lb (113.399 kg) 250 lb 10.6 oz (113.7 kg) 259 lb 0.7 oz (117.5 kg)    PHYSICAL EXAM General: Well developed, well nourished, male in no acute distress. Head: Normocephalic, atraumatic.  Neck: Supple without bruits, no JVD. Lungs:  Resp regular and unlabored, CTA. Heart: RRR, S1, S2, no S3, S4, or murmur; no rub. Abdomen: Soft, non-tender, non-distended, BS + x 4.  Extremities: No clubbing, cyanosis, no edema.  Neuro: Alert and oriented X 3. Moves all extremities spontaneously. Psych: Normal affect.  LABS: CBC: Recent Labs  10/21/15 0348 10/22/15 0353  WBC 16.3* 13.6*  NEUTROABS  --  11.1*  HGB 8.8* 7.8*  HCT 25.2* 22.7*  MCV 81.3 82.2  PLT  422* 357   Basic Metabolic Panel: Recent Labs  10/21/15 0348 10/22/15 0353  NA 129* 129*  K 3.3* 3.2*  CL 88* 92*  CO2 23 21*  GLUCOSE 96 102*  BUN 131* 128*  CREATININE 13.57* 13.35*  CALCIUM 8.8* 8.6*  PHOS 10.1* 8.7*   Liver Function Tests: Recent Labs  10/21/15 0348 10/22/15 0353  ALBUMIN 3.2* 3.1*   Fasting Lipid Panel: Recent Labs  10/20/15 0320  CHOL 238*  HDL 31*  LDLCALC 158*  TRIG 244*  CHOLHDL 7.7    Current facility-administered medications:  .  0.9 %  sodium chloride infusion, 100 mL, Intravenous, PRN, Arita Miss, MD .  0.9 %  sodium chloride infusion, 100 mL, Intravenous, PRN, Arita Miss, MD .  0.9 %  sodium chloride infusion, , Intravenous, Continuous, Terrial Rhodes, MD, Last Rate: 100 mL/hr at 10/22/15 0704, 1,000 mL at 10/22/15 0704 .  acetaminophen (TYLENOL) tablet 650 mg, 650 mg, Oral, Q6H PRN, 650 mg at 10/19/15 0245 **OR** acetaminophen (TYLENOL) suppository 650 mg, 650 mg, Rectal, Q6H PRN, Lesle Chris Black, NP .  alteplase (CATHFLO ACTIVASE) injection 2 mg, 2 mg, Intracatheter, Once PRN, Arita Miss, MD .  amLODipine (NORVASC) tablet 10 mg, 10 mg, Oral, Daily, Lesle Chris Black, NP, 10 mg at 10/21/15 1204 .  carvedilol (COREG) tablet 25 mg, 25  mg, Oral, BID WC, Beryle Lathe, MD, 25 mg at 10/21/15 1738 .  cloNIDine (CATAPRES) tablet 0.1 mg, 0.1 mg, Oral, BID, Inez Catalina, MD, 0.1 mg at 10/21/15 2131 .  famotidine (PEPCID) tablet 20 mg, 20 mg, Oral, QHS, Inez Catalina, MD, 20 mg at 10/21/15 2131 .  furosemide (LASIX) tablet 80 mg, 80 mg, Oral, BID, Terrial Rhodes, MD, 80 mg at 10/21/15 1737 .  gi cocktail (Maalox,Lidocaine,Donnatal), 30 mL, Oral, Q6H PRN, Dorothea Ogle, MD, 30 mL at 10/18/15 2021 .  heparin injection 1,000 Units, 1,000 Units, Dialysis, PRN, Arita Miss, MD .  heparin injection 5,000 Units, 5,000 Units, Subcutaneous, Q8H, Dorothea Ogle, MD, 5,000 Units at 10/21/15 2132 .  hydrALAZINE (APRESOLINE) injection 20  mg, 20 mg, Intravenous, Q6H PRN, Michael Litter, MD, 20 mg at 10/18/15 1456 .  hydrALAZINE (APRESOLINE) tablet 100 mg, 100 mg, Oral, Q8H, Arita Miss, MD, 100 mg at 10/21/15 2131 .  HYDROcodone-acetaminophen (NORCO/VICODIN) 5-325 MG per tablet 1-2 tablet, 1-2 tablet, Oral, Q4H PRN, Gwenyth Bender, NP, 2 tablet at 10/21/15 2129 .  lidocaine (PF) (XYLOCAINE) 1 % injection 5 mL, 5 mL, Intradermal, PRN, Arita Miss, MD .  lidocaine-prilocaine (EMLA) cream 1 application, 1 application, Topical, PRN, Arita Miss, MD .  metoprolol (LOPRESSOR) injection 5 mg, 5 mg, Intravenous, Q6H PRN, Dorothea Ogle, MD, 5 mg at 10/19/15 0230 .  minoxidil (LONITEN) tablet 7.5 mg, 7.5 mg, Oral, BID, Arita Miss, MD, 7.5 mg at 10/21/15 2129 .  morphine 2 MG/ML injection 1-2 mg, 1-2 mg, Intravenous, Q4H PRN, Dorothea Ogle, MD, 2 mg at 10/20/15 1449 .  ondansetron (ZOFRAN) 4 MG/2ML injection, , , ,  .  ondansetron (ZOFRAN) tablet 4 mg, 4 mg, Oral, Q6H PRN, 4 mg at 10/19/15 2239 **OR** ondansetron (ZOFRAN) injection 4 mg, 4 mg, Intravenous, Q6H PRN, Gwenyth Bender, NP, 4 mg at 10/22/15 0754 .  pantoprazole (PROTONIX) EC tablet 40 mg, 40 mg, Oral, Q0600, Kathlen Mody, MD, 40 mg at 10/21/15 0655 .  pentafluoroprop-tetrafluoroeth (GEBAUERS) aerosol 1 application, 1 application, Topical, PRN, Arita Miss, MD .  polyethylene glycol (MIRALAX / GLYCOLAX) packet 17 g, 17 g, Oral, Daily PRN, Lesle Chris Black, NP .  sevelamer carbonate (RENVELA) tablet 1,600 mg, 1,600 mg, Oral, TID WC, Arita Miss, MD, 1,600 mg at 10/21/15 1738 .  spironolactone (ALDACTONE) tablet 25 mg, 25 mg, Oral, Daily, Lars Masson, MD, 25 mg at 10/21/15 1204 .  verapamil (CALAN-SR) CR tablet 240 mg, 240 mg, Oral, Daily, Lars Masson, MD, 240 mg at 10/21/15 1204 . sodium chloride 1,000 mL (10/22/15 0704)   TELE: Sinus tach, ST depression.        ECG: Sinus tach, ST depression in anterolateral leads.  Transthoracic Echocardiography  10/19/15 - Left ventricle: The cavity size was normal. Wall thickness was  increased in a pattern of severe LVH. Systolic function was  vigorous. The estimated ejection fraction was in the range of 65%  to 70%. Wall motion was normal; there were no regional wall  motion abnormalities. There was fusion of early and atrial  contributions to ventricular filling. Doppler parameters are  consistent with abnormal left ventricular relaxation (grade 1  diastolic dysfunction). - Left atrium: The atrium was moderately dilated.  Current Medications:  . amLODipine  10 mg Oral Daily  . carvedilol  25 mg Oral BID WC  . cloNIDine  0.1 mg Oral BID  . famotidine  20  mg Oral QHS  . furosemide  80 mg Oral BID  . heparin subcutaneous  5,000 Units Subcutaneous Q8H  . hydrALAZINE  100 mg Oral Q8H  . minoxidil  7.5 mg Oral BID  . ondansetron      . pantoprazole  40 mg Oral Q0600  . sevelamer carbonate  1,600 mg Oral TID WC  . spironolactone  25 mg Oral Daily  . verapamil  240 mg Oral Daily   . sodium chloride 1,000 mL (10/22/15 0704)    ASSESSMENT AND PLAN: Principal Problem:   CKD (chronic kidney disease) Active Problems:   HTN (hypertension)   Acute kidney injury (HCC)   Thrombocytopenia (HCC)   Hematuria   Leukocytosis   Accelerated hypertension   Streptococcal sore throat   Post-streptococcal glomerulonephritis   Essential hypertension  1. Chest pain: Symptoms seem to have been related to GERD. Although his EKG does have changes, they are consistent with LVH with repolarization abnormality. His echo is reassuring and does not reveal any wall motion abnormalities. Troponin is elevated, but stable and he has AoCKD. Given that he is 24 the likelihood of ACS is slim. No cath given that he may have recovery of renal function. Consider stress once he is over this acute phase.  2. Malignant HTN: Blood pressure remains poorly-controlled on multiple agents. He is not on an ACE-I/ARB as  there is hope for renal recovery. On echo he has severe LVH and an intracavitary gradient. He also has a murmur on exam. This is due to his hypertension and LVH, not HCM. The physiology is similar. Verapamil  increased to 240 mg po daily and spironolactone 25 mg po daily.   3.  Acute kidney injury: Nephrology following, HD today. Going to repeat chest X ray to evaluate HD catheter position as he is having chest pain with HD.    Signed, Little IshikawaErin E Smith , NP 9:03 AM 10/22/2015 Pager [phone]  Patient examined chart reviewed no rub on exam. troponins no evolution  ECG with LVH.  BP improved on Rx continue dialysis will arrange outpatient F/u with Dr Duke Salviaandolph consider myovue in 6-8 weeks  Charlton HawsPeter Nishan

## 2015-10-23 ENCOUNTER — Inpatient Hospital Stay (HOSPITAL_COMMUNITY): Payer: Medicaid Other

## 2015-10-23 LAB — CBC
HEMATOCRIT: 22 % — AB (ref 39.0–52.0)
Hemoglobin: 7.3 g/dL — ABNORMAL LOW (ref 13.0–17.0)
MCH: 28.1 pg (ref 26.0–34.0)
MCHC: 33.2 g/dL (ref 30.0–36.0)
MCV: 84.6 fL (ref 78.0–100.0)
Platelets: 323 10*3/uL (ref 150–400)
RBC: 2.6 MIL/uL — ABNORMAL LOW (ref 4.22–5.81)
RDW: 13.6 % (ref 11.5–15.5)
WBC: 14.1 10*3/uL — ABNORMAL HIGH (ref 4.0–10.5)

## 2015-10-23 LAB — POCT I-STAT 3, ART BLOOD GAS (G3+)
Bicarbonate: 23.5 mEq/L (ref 20.0–24.0)
O2 Saturation: 98 %
PH ART: 7.481 — AB (ref 7.350–7.450)
Patient temperature: 100.4
TCO2: 24 mmol/L (ref 0–100)
pCO2 arterial: 31.8 mmHg — ABNORMAL LOW (ref 35.0–45.0)
pO2, Arterial: 107 mmHg — ABNORMAL HIGH (ref 80.0–100.0)

## 2015-10-23 LAB — COMPREHENSIVE METABOLIC PANEL
ALBUMIN: 3 g/dL — AB (ref 3.5–5.0)
ALT: 8 U/L — ABNORMAL LOW (ref 17–63)
ANION GAP: 14 (ref 5–15)
AST: 18 U/L (ref 15–41)
Alkaline Phosphatase: 34 U/L — ABNORMAL LOW (ref 38–126)
BILIRUBIN TOTAL: 0.8 mg/dL (ref 0.3–1.2)
BUN: 89 mg/dL — ABNORMAL HIGH (ref 6–20)
CALCIUM: 8.6 mg/dL — AB (ref 8.9–10.3)
CO2: 21 mmol/L — ABNORMAL LOW (ref 22–32)
Chloride: 97 mmol/L — ABNORMAL LOW (ref 101–111)
Creatinine, Ser: 10.58 mg/dL — ABNORMAL HIGH (ref 0.61–1.24)
GFR, EST AFRICAN AMERICAN: 7 mL/min — AB (ref >60)
GFR, EST NON AFRICAN AMERICAN: 6 mL/min — AB (ref >60)
Glucose, Bld: 93 mg/dL (ref 65–99)
POTASSIUM: 3.8 mmol/L (ref 3.5–5.1)
Sodium: 132 mmol/L — ABNORMAL LOW (ref 135–145)
TOTAL PROTEIN: 6.7 g/dL (ref 6.5–8.1)

## 2015-10-23 LAB — TROPONIN I
TROPONIN I: 0.3 ng/mL — AB (ref <0.03)
TROPONIN I: 0.24 ng/mL — AB (ref <0.03)

## 2015-10-23 LAB — PHOSPHORUS: PHOSPHORUS: 6 mg/dL — AB (ref 2.5–4.6)

## 2015-10-23 MED ORDER — SODIUM CHLORIDE 0.9 % IV SOLN
1500.0000 mg | Freq: Once | INTRAVENOUS | Status: AC
  Administered 2015-10-23: 1500 mg via INTRAVENOUS
  Filled 2015-10-23 (×2): qty 1500

## 2015-10-23 MED ORDER — NITROGLYCERIN IN D5W 200-5 MCG/ML-% IV SOLN
0.0000 ug/min | INTRAVENOUS | Status: DC
  Filled 2015-10-23: qty 250

## 2015-10-23 MED ORDER — FUROSEMIDE 10 MG/ML IJ SOLN
80.0000 mg | Freq: Three times a day (TID) | INTRAMUSCULAR | Status: DC
  Administered 2015-10-23: 80 mg via INTRAVENOUS
  Filled 2015-10-23 (×2): qty 8

## 2015-10-23 MED ORDER — HEPARIN SODIUM (PORCINE) 1000 UNIT/ML DIALYSIS: 20.0000 [IU]/kg | INTRAMUSCULAR | Status: DC | PRN

## 2015-10-23 MED ORDER — DEXTROSE 5 % IV SOLN
2.0000 g | Freq: Once | INTRAVENOUS | Status: DC
  Filled 2015-10-23: qty 2

## 2015-10-23 MED ORDER — ALPRAZOLAM 0.25 MG PO TABS
0.2500 mg | ORAL_TABLET | Freq: Once | ORAL | Status: AC | PRN
  Administered 2015-10-23: 0.25 mg via ORAL
  Filled 2015-10-23: qty 1

## 2015-10-23 MED ORDER — ATORVASTATIN CALCIUM 20 MG PO TABS
20.0000 mg | ORAL_TABLET | Freq: Every day | ORAL | Status: DC
  Administered 2015-10-23 – 2015-10-29 (×7): 20 mg via ORAL
  Filled 2015-10-23 (×7): qty 1

## 2015-10-23 MED ORDER — METHYLPREDNISOLONE SODIUM SUCC 125 MG IJ SOLR
60.0000 mg | Freq: Three times a day (TID) | INTRAMUSCULAR | Status: DC
  Administered 2015-10-23 – 2015-10-25 (×6): 60 mg via INTRAVENOUS
  Filled 2015-10-23 (×6): qty 2

## 2015-10-23 MED ORDER — ALPRAZOLAM 0.5 MG PO TABS
ORAL_TABLET | ORAL | Status: AC
  Filled 2015-10-23: qty 1

## 2015-10-23 MED ORDER — FUROSEMIDE 10 MG/ML IJ SOLN
120.0000 mg | Freq: Three times a day (TID) | INTRAVENOUS | Status: DC
  Administered 2015-10-23 – 2015-10-25 (×5): 120 mg via INTRAVENOUS
  Filled 2015-10-23 (×8): qty 12

## 2015-10-23 MED ORDER — FUROSEMIDE 10 MG/ML IJ SOLN
60.0000 mg | Freq: Once | INTRAMUSCULAR | Status: AC
  Administered 2015-10-23: 60 mg via INTRAVENOUS
  Filled 2015-10-23: qty 6

## 2015-10-23 MED ORDER — NITROGLYCERIN 0.2 MG/HR TD PT24
0.2000 mg | MEDICATED_PATCH | Freq: Once | TRANSDERMAL | Status: DC
  Filled 2015-10-23: qty 1

## 2015-10-23 MED ORDER — METHYLPREDNISOLONE SODIUM SUCC 40 MG IJ SOLR
40.0000 mg | Freq: Two times a day (BID) | INTRAMUSCULAR | Status: DC
  Administered 2015-10-23: 40 mg via INTRAVENOUS
  Filled 2015-10-23: qty 1

## 2015-10-23 MED ORDER — LEVALBUTEROL HCL 1.25 MG/0.5ML IN NEBU
1.2500 mg | INHALATION_SOLUTION | Freq: Three times a day (TID) | RESPIRATORY_TRACT | Status: DC
  Administered 2015-10-23 (×3): 1.25 mg via RESPIRATORY_TRACT
  Filled 2015-10-23 (×2): qty 0.5

## 2015-10-23 MED ORDER — SODIUM CHLORIDE 0.9 % IV SOLN: 100.0000 mL | INTRAVENOUS | Status: DC | PRN

## 2015-10-23 NOTE — Progress Notes (Signed)
Patient complaining of shortness of breath. Text paged and received call back from Dr. Susie CassetteAbrol, asked MD to come assess patient. Rapid response notified, en route to see patient as well. Joseph Villegas, Joseph Villegas

## 2015-10-23 NOTE — Progress Notes (Signed)
Pharmacy Antibiotic Note  Joseph Villegas is a 25 y.o. male admitted on 10/08/2015 with pneumonia.  Pharmacy has been consulted for Vancomycin and Cefepime dosing.  Plan: Cefepime 2 grams iv x 1 now Vancomycin 1500 mg iv x 1 now Further dosing after HD  Height: 5\' 10"  (177.8 cm) Weight: 263 lb 11.2 oz (119.614 kg) IBW/kg (Calculated) : 73  Temp (24hrs), Avg:98.6 F (37 C), Min:98.2 F (36.8 C), Max:98.9 F (37.2 C)   Recent Labs Lab 10/18/15 0536 10/19/15 0431 10/20/15 0320 10/21/15 0348 10/22/15 0353 10/23/15 0149  WBC 14.2* 16.0*  --  16.3* 13.6* 14.1*  CREATININE 13.29* 13.67* 13.72* 13.57* 13.35* 10.58*    Estimated Creatinine Clearance: 13.7 mL/min (by C-G formula based on Cr of 10.58).    No Known Allergies   Thank you for allowing pharmacy to be a part of this patient's care. Joseph Villegas, PharmD [phone]  10/23/2015 12:47 PM

## 2015-10-23 NOTE — Progress Notes (Signed)
Hemodialysis= Pt brought to unit on NRB, sats 94-96%. HD started at 1306 via R HD cath without issue. Spoke with Dr. Salena Saner, ok to hold nitro gtt for now and get on HD first. Pt denies chest pain and is currently sleeping. BP 150s/90s. Will transfer to stepdown post HD per primary RN. Continue to monitor closely.

## 2015-10-23 NOTE — Progress Notes (Signed)
RT called for pt in respiratory distress and desats. Upon entering room MD, rapid response and RNs at bedside. PT on nonrebreather with SATs 94%, lung sounds clear with some faint crackles in lower lobes. Pt receiving lasix at this time and awaiting dialysis. PT states he is breathing better. RT will continue to follow.

## 2015-10-23 NOTE — Progress Notes (Signed)
Patient with c/o  Chest tightness right /lateral  HD cath site. No redness ,drainage visible at cath site.VSS see doc flow sheet, denies radiating of pain .Administered 2mg  morphine Iv  For relief.Patient continues with not being able to get enough air.O2 2l/Sundown  As previously charted. Texting  MD on call.

## 2015-10-23 NOTE — Progress Notes (Signed)
Patient came back from CT, hooked onto the pulse ox showed 82% on 2l, hooked on to the non rebreather mask and on max, showed 95%.  Called RRN and notified about the situation, NOtified Dr. Susie CassetteAbrol, and Resp therapist.  Did stat EKG which was ordered showed NSR, ST and T wave abnormality, consider inferolateral ischemia.  Dr. Susie CassetteAbrol ordered to give 80 mg IV Lasix which was administered.  Stat orders for HD put in, gave report to HD nurse.  Patient currently in HD. His HR 94, BP 154/92.

## 2015-10-23 NOTE — Progress Notes (Signed)
Hemodialysis- Pt awoke from sleep with respirations increased, sats 86-90% on NRB. Respiratory to bedside giving neb. Dr. Susie CassetteAbrol paged. Order for Bipap prn if needed. Dr. Arrie Aranoladonato notified as well. Post nebulizer pt sats increased to 93% on NRB. Will continue to monitor and call for Bipap if drops again. Dr. Salena Saner updated at bedside.

## 2015-10-23 NOTE — Progress Notes (Signed)
Patient ID: Joseph Villegas, male   DOB: Dec 12, 1990, 25 y.o.   MRN: [id number] S:complaining of SOB this morning O:BP 169/77 mmHg  Pulse 107  Temp(Src) 98.9 F (37.2 C) (Oral)  Resp 20  Ht 5\' 10"  (1.778 m)  Wt 119.614 kg (263 lb 11.2 oz)  BMI 37.84 kg/m2  SpO2 92%  Intake/Output Summary (Last 24 hours) at 10/23/15 0824 Last data filed at 10/23/15 0600  Gross per 24 hour  Intake    840 ml  Output    267 ml  Net    573 ml   Intake/Output: I/O last 3 completed shifts: In: 1080 [P.O.:1080] Out: 732 [Urine:1015]  Intake/Output this shift:    Weight change:  Gen:WD WN AAM in mild distress NWG:NFAOZCVS:tachy Resp:bilateral expiratory wheezes Abd:+BS, soft Ext:no edema   Recent Labs Lab 10/17/15 0353 10/18/15 0536 10/19/15 0431 10/20/15 0320 10/21/15 0348 10/22/15 0353 10/23/15 0149  NA 133* 129* 127* 132* 129* 129* 132*  K 3.4* 3.1* 3.5 3.1* 3.3* 3.2* 3.8  CL 93* 84* 81* 83* 88* 92* 97*  CO2 23 25 25 25 23  21* 21*  GLUCOSE 108* 121* 127* 86 96 102* 93  BUN 104* 112* 120* 126* 131* 128* 89*  CREATININE 13.47* 13.29* 13.67* 13.72* 13.57* 13.35* 10.58*  ALBUMIN 3.3* 3.5 3.9 3.3* 3.2* 3.1* 3.0*  CALCIUM 9.4 9.5 9.6 9.5 8.8* 8.6* 8.6*  PHOS 9.9* 10.3* 11.3* 11.8* 10.1* 8.7* 6.0*  AST  --   --   --   --   --   --  18  ALT  --   --   --   --   --   --  8*   Liver Function Tests:  Recent Labs Lab 10/21/15 0348 10/22/15 0353 10/23/15 0149  AST  --   --  18  ALT  --   --  8*  ALKPHOS  --   --  34*  BILITOT  --   --  0.8  PROT  --   --  6.7  ALBUMIN 3.2* 3.1* 3.0*   No results for input(s): LIPASE, AMYLASE in the last 168 hours. No results for input(s): AMMONIA in the last 168 hours. CBC:  Recent Labs Lab 10/18/15 0536 10/19/15 0431 10/21/15 0348 10/22/15 0353 10/23/15 0149  WBC 14.2* 16.0* 16.3* 13.6* 14.1*  NEUTROABS  --   --   --  11.1*  --   HGB 12.1* 12.0* 8.8* 7.8* 7.3*  HCT 33.6* 34.1* 25.2* 22.7* 22.0*  MCV 82.0 82.0 81.3 82.2 84.6  PLT 349 399 422*  357 323   Cardiac Enzymes:  Recent Labs Lab 10/18/15 1813 10/18/15 2227 10/19/15 0431 10/19/15 0812  TROPONINI 0.98* 0.95* 0.91* 0.88*   CBG: No results for input(s): GLUCAP in the last 168 hours.  Iron Studies: No results for input(s): IRON, TIBC, TRANSFERRIN, FERRITIN in the last 72 hours. Studies/Results: Dg Chest 2 View  10/22/2015  CLINICAL DATA:  Fever, cough. EXAM: CHEST  2 VIEW COMPARISON:  None. FINDINGS: The heart size and mediastinal contours are within normal limits. Left lung is clear. No pneumothorax or significant pleural effusion is noted. Right internal jugular dialysis catheter is noted with distal tip in expected position of the SVC. Right perihilar and basilar opacity is noted concerning for edema or possibly pneumonia. The visualized skeletal structures are unremarkable. IMPRESSION: Right perihilar and basilar opacity concerning for edema or pneumonia. Follow-up radiographs are recommended. Electronically Signed   By: Lupita RaiderJames  Green Jr, M.D.   On: 10/22/2015  15:37   . amLODipine  10 mg Oral Daily  . carvedilol  25 mg Oral BID WC  . cloNIDine  0.1 mg Oral BID  . famotidine  20 mg Oral QHS  . furosemide  80 mg Oral BID  . heparin subcutaneous  5,000 Units Subcutaneous Q8H  . hydrALAZINE  100 mg Oral Q8H  . minoxidil  7.5 mg Oral BID  . pantoprazole  40 mg Oral Q0600  . sevelamer carbonate  1,600 mg Oral TID WC  . spironolactone  25 mg Oral Daily  . verapamil  240 mg Oral Daily    BMET    Component Value Date/Time   NA 132* 10/23/2015 0149   K 3.8 10/23/2015 0149   CL 97* 10/23/2015 0149   CO2 21* 10/23/2015 0149   GLUCOSE 93 10/23/2015 0149   BUN 89* 10/23/2015 0149   CREATININE 10.58* 10/23/2015 0149   CALCIUM 8.6* 10/23/2015 0149   GFRNONAA 6* 10/23/2015 0149   GFRAA 7* 10/23/2015 0149   CBC    Component Value Date/Time   WBC 14.1* 10/23/2015 0149   RBC 2.60* 10/23/2015 0149   HGB 7.3* 10/23/2015 0149   HCT 22.0* 10/23/2015 0149   PLT 323  10/23/2015 0149   MCV 84.6 10/23/2015 0149   MCH 28.1 10/23/2015 0149   MCHC 33.2 10/23/2015 0149   RDW 13.6 10/23/2015 0149   LYMPHSABS 1.8 10/22/2015 0353   MONOABS 0.7 10/22/2015 0353   EOSABS 0.0 10/22/2015 0353   BASOSABS 0.0 10/22/2015 0353     Assessment/Plan:  1. 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 Last HD session was on 10/16/15  1. Negative GN work up 2. Possibility of recovery however worsening prerenal azotemia.  3. Will plan for another session of HD today due to persistence of mild uremic symptoms 4. Continue to follow closely for recovery 5. BUN rising out of proportion of Scr and did not respont to increased IVF's, decreased lasix, HD as above 2. SOB and wheezing- agree with steroids and nebs.  Will also add IV lasix due to pulmonary infiltrates 1. Discussed with cardiology, he may need to start IV nitro gtt and transfer to SDU 2. Reluctant to attempt HD with UF as he developed chest pressure yesterday.  Will cont to follow closely. 3. Malignant HTN- still not at goal. Recent increase of minoxidil and resumption of hydralazine. Consider clonidine 0.1mg  bid 4. Hyperphosphatemia 5. Chest tightness with HD- CXR to evaluate HD catheter placement was unremarkable. ECHO was without pericardial effusion. Continue to workup per primary svc.    [name] Antonietta Lansdowne

## 2015-10-23 NOTE — Progress Notes (Addendum)
PROGRESS NOTE    Joseph Villegas  ZOX:[id number] DOB: 29-Dec-1990 DOA: 10/08/2015 No PCP  Brief Narrative:  25 y.o. year-old with HTN, 1 week history of fever, cough, sore throat, weakness. His evaluation there was significant for hypertension (204/152), leukocytosis to 17,000, mild thrombocytopenia of 120K, creatinine of 7.17 and baseline creatinine not known. Urinalysis revealed >300 protein, large blood, TNTC RBC's. Rapid strep test +. Nephrology consulted.   7/14 pts echocardiogram unremarkable. Urinary retention, foley catheter placed.  7/15 - Patient wanted foley catheter out, had left sided flank pain after removed, but has been urinating 7/18 patient stepdown rapid response and acute onset of shortness  Assessment & Plan:   AKI vs. 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 He also has a murmur on exam. This is due to his hypertension and LVH, not HCM. The physiology is similar.    Chest pain: Symptoms seem to have been related to GERD. Although his EKG does have changes, they are consistent with LVH with repolarization abnormality. His echo is reassuring and does not reveal any wall motion abnormalities. Troponin is elevated, but stable and he has AoCKD. Given that he is 24 the likelihood of ACS is slim troponins  remained in the range of 0.8-0.95 , now trending down ECG with LVH. BP improved on Rx continue dialysis will arrange  outpatient F/u with Dr Duke Salvia consider myovue in 6-8 weeks as per Wendall Stade, MD Has dialysis catheter right subclavian. Now has increasing respiratory distress with rapid response this morning. CXR with marked right sided changes Hct has dropped from 34 to 22 CT shows possible pneumonia patient restarted on antibiotics for HCAP Cardiology recommends transfer to stepdown  Anemia-likely anemia of chronic disease Patient had a drop in hemoglobin from 12->8.8 on 7/16 Rule out complications from hemodialysis catheter placed  Strep throat - Treated with amoxicillin, completed  Acute urinary retention with spasms - Patient insistent that foley come out 7/15 - he is aware of need for bladder scanning and possibly I/O - Continue morphine PRN pain - Sx improved today  Hyponatremia - Stable and mild, likely due to renal failure  Hypokalemia - 3.2 today, replete with potassium, recheck in the AM  Leukocytosis - Unclear cause, improved initially and now 13.6 - He is afebrile and without acute findings concerning for infection - Check CBC with diff in the AM - Culture for fever.    DVT prophylaxis: Heparin Code Status: Full Family Communication: None today Disposition Plan: Disposition as per nephrology   Consultants:   Nephrology  IR  Cardiology  Procedures:   Renal biopsy by IR 7/10  IR placed R IJ TDC 7/10  Echocardiogram.  Antimicrobials:   Amoxicillin, completed course    Subjective: Not feeling well, SOB, labored respirations, diaphoretic   Objective: Filed Vitals:   10/22/15 2258 10/22/15 2300 10/23/15 0515 10/23/15 0743  BP:  180/75 167/84 169/77  Pulse:  100 98 107  Temp:   98.2 F (  36.8 C) 98.9 F (37.2 C)  TempSrc:   Oral Oral  Resp:  Height:      Weight:      SpO2: 94% 95% 94% 92%    Intake/Output Summary (Last 24 hours) at 10/23/15 1050 Last data filed at 10/23/15 1026  Gross per 24 hour  Intake   1200 ml  Output     500 ml  Net    700 ml   Filed Weights   10/17/15 2029 10/22/15 0720 10/22/15 2008  Weight: 113.7 kg (250 lb 10.6 oz) 117.5 kg (259 lb 0.7 oz) 119.614 kg (263 lb 11.2 oz)    Examination:  General exam: Appears calm and comfortable  Eyes: EOMI, anicteric sclerae Respiratory system: Respiratory effort normal. No audible wheezing Cardiovascular system: RR, NR. No murmur Gastrointestinal system: Abdomen is nondistended, soft and nontender.  +BS Central nervous system: Alert and oriented. No focal neurological deficits. Skin: No new rashes, lesions or ulcers Psychiatry: Judgement and insight appear normal. Mood & affect appropriate.     Data Reviewed:  CBC:  Recent Labs Lab 10/18/15 0536 10/19/15 0431 10/21/15 0348 10/22/15 0353 10/23/15 0149  WBC 14.2* 16.0* 16.3* 13.6* 14.1*  NEUTROABS  --   --   --  11.1*  --   HGB 12.1* 12.0* 8.8* 7.8* 7.3*  HCT 33.6* 34.1* 25.2* 22.7* 22.0*  MCV 82.0 82.0 81.3 82.2 84.6  PLT 349 399 422* 357 323   Basic Metabolic Panel:  Recent Labs Lab 10/19/15 0431 10/20/15 0320 10/21/15 0348 10/22/15 0353 10/23/15 0149  NA 127* 132* 129* 129* 132*  K 3.5 3.1* 3.3* 3.2* 3.8  CL 81* 83* 88* 92* 97*  CO2 21* 21*  GLUCOSE 127* 86 96 102* 93  BUN 120* 126* 131* 128* 89*  CREATININE 13.67* 13.72* 13.57* 13.35* 10.58*  CALCIUM 9.6 9.5 8.8* 8.6* 8.6*  PHOS 11.3* 11.8* 10.1* 8.7* 6.0*   GFR: Estimated Creatinine Clearance: 13.7 mL/min (by C-G formula based on Cr of 10.58). Liver Function Tests:  Recent Labs Lab 10/19/15 0431 10/20/15 0320 10/21/15 0348 10/22/15 0353 10/23/15 0149  AST  --   --   --   --  18  ALT  --   --   --   --  8*  ALKPHOS  --   --   --   --  34*  BILITOT  --   --   --   --  0.8  PROT  --   --   --   --  6.7  ALBUMIN 3.9 3.3* 3.2* 3.1* 3.0*   No results for input(s): LIPASE, AMYLASE in the last 168 hours. No results for input(s): AMMONIA in the last 168 hours. Coagulation Profile: No results for  input(s): INR, PROTIME in the last 168 hours. Cardiac Enzymes:  Recent Labs Lab 10/18/15 1813 10/18/15 2227 10/19/15 0431 10/19/15 0812 10/23/15 0806  TROPONINI 0.98* 0.95* 0.91* 0.88* 0.30*   BNP (last 3 results) No results for input(s): PROBNP in the last 8760 hours. HbA1C: No results for input(s): HGBA1C in the last 72 hours. CBG: No results for input(s): GLUCAP in the last 168 hours. Lipid Profile: No results for input(s): CHOL, HDL, LDLCALC, TRIG, CHOLHDL, LDLDIRECT in the last 72 hours.    Radiology Studies: Dg Chest 2 View  10/23/2015  CLINICAL DATA:  Patient with persistent shortness of breath since dialysis. Mid sternal chest pain. EXAM: CHEST  2 VIEW COMPARISON:  10/22/2015 chest radiograph FINDINGS:  Central venous catheter tip projects over the superior vena cava. Monitoring leads overlie the patient. Stable cardiomegaly. Significant interval worsening of diffuse consolidation throughout the right lung. Left lung is clear. Small right pleural effusion. Regional skeleton is unremarkable. IMPRESSION: Significant worsening diffuse airspace opacification of the right hemi thorax concerning for pneumonia or potentially asymmetric edema. Continued radiographic followup to ensure resolution is recommended. Small right pleural effusion. Electronically Signed   By: Annia Beltrew  Davis M.D.   On: 10/23/2015 09:40   Dg Chest 2 View  10/22/2015  CLINICAL DATA:  Fever, cough. EXAM: CHEST  2 VIEW COMPARISON:  None. FINDINGS: The heart size and mediastinal contours are within normal limits. Left lung is clear. No pneumothorax or significant pleural effusion is noted. Right internal jugular dialysis catheter is noted with distal tip in expected position of the SVC. Right perihilar and basilar opacity is noted concerning for edema or possibly pneumonia. The visualized skeletal structures are unremarkable. IMPRESSION: Right perihilar and basilar opacity concerning for edema or pneumonia. Follow-up  radiographs are recommended. Electronically Signed   By: Lupita RaiderJames  Green Jr, M.D.   On: 10/22/2015 15:37        Scheduled Meds: . amLODipine  10 mg Oral Daily  . atorvastatin  20 mg Oral q1800  . carvedilol  25 mg Oral BID WC  . cloNIDine  0.1 mg Oral BID  . famotidine  20 mg Oral QHS  . furosemide  80 mg Intravenous Q8H  . heparin subcutaneous  5,000 Units Subcutaneous Q8H  . hydrALAZINE  100 mg Oral Q8H  . levalbuterol  1.25 mg Nebulization Q8H  . methylPREDNISolone (SOLU-MEDROL) injection  40 mg Intravenous Q12H  . minoxidil  7.5 mg Oral BID  . pantoprazole  40 mg Oral Q0600  . sevelamer carbonate  1,600 mg Oral TID WC  . spironolactone  25 mg Oral Daily  . verapamil  240 mg Oral Daily   Continuous Infusions: . sodium chloride 100 mL/hr at 10/22/15 1732     LOS: 15 days    Time spent: 25 minutes    Richarda OverlieABROL,Harlee Eckroth, MD [facility] Pager [phone]  If 7PM-7AM, please contact night-coverage [email or web address] Password [email or web address] 10/23/2015, 10:50 AM

## 2015-10-23 NOTE — Progress Notes (Signed)
Pt still requiring NRB at 100% This RN attempted to wean pt to venti mask at 55% but spO2 dropped to mid 80s. Pt's WOB and RR increased. Dr. Susie CassetteAbrol paged. Verbal orders given to place pt on Bipap, lasix to 120mg  IV Q8hr, Solu-medrol 60mg  IV Q8h, and to keep pt NPO. Dr. Susie CassetteAbrol made aware pt did not receive IV Vanc dose in HD. Verbal orders to give. Will continue to monitor and assess pt closely.

## 2015-10-23 NOTE — Progress Notes (Signed)
Patient with SOB today, CT angio chest recently complete.  Per RN patient desat to 80s on [place].  Placed on 100% NRB.  Dr Susie CassetteAbrol at bedside.  RN giving lasix IV and preparing patient for HD.  O2 sats 94% on NRB, lung sounds with some crackles.  Plan transfer to 2H24 after HD treatment.  RN to call if assistance needed.

## 2015-10-23 NOTE — Progress Notes (Signed)
Text page sent to cardiology fellow concerning last trop and bedside tele. Physician denied need for serial troponin/EKGs as of this time as long as patient remains chest pain free. Nitro gtt remains without use at this time. Patient denies complaints. VSS.

## 2015-10-23 NOTE — Progress Notes (Signed)
Hemodialysis- Treatment completed. 2.5L UF. Pt remains on non-rebreather, improved after nebulizer. Report called to 2H, pt will be transferred.

## 2015-10-23 NOTE — Progress Notes (Signed)
Patient Name: Joseph Villegas Date of Encounter: 10/23/2015  Principal Problem:   CKD (chronic kidney disease) Active Problems:   HTN (hypertension)   Acute kidney injury (HCC)   Thrombocytopenia (HCC)   Hematuria   Leukocytosis   Accelerated hypertension   Streptococcal sore throat   Post-streptococcal glomerulonephritis   Essential hypertension   AKI (acute kidney injury) [facility])   Primary Cardiologist: New - Dr. Eden Emms Patient Profile: Mr. Joseph Villegas is a 41M with malignant hypertension admitted with acute on chronic kidney failure and newly initiated HD. Cardiology consulted for chest pain and elevated troponin. His symptoms resolved with in/out foley catheterization and GERD treatment.   SUBJECTIVE: Not feeling well, SOB, labored respirations, diaphoretic.    OBJECTIVE Filed Vitals:   10/22/15 2258 10/22/15 2300 10/23/15 0515 10/23/15 0743  BP:  180/75 167/84 169/77  Pulse:  100 98 107  Temp:   98.2 F (36.8 C) 98.9 F (37.2 C)  TempSrc:   Oral Oral  Resp:  22 21 20   Height:      Weight:      SpO2: 94% 95% 94% 92%    Intake/Output Summary (Last 24 hours) at 10/23/15 0921 Last data filed at 10/23/15 0600  Gross per 24 hour  Intake    840 ml  Output    267 ml  Net    573 ml   Filed Weights   10/17/15 2029 10/22/15 0720 10/22/15 2008  Weight: 250 lb 10.6 oz (113.7 kg) 259 lb 0.7 oz (117.5 kg) 263 lb 11.2 oz (119.614 kg)    PHYSICAL EXAM General: Well developed, well nourished, male in acute distress. Head: Normocephalic, atraumatic.  Neck: Supple without bruits, no JVD. Lungs:  Resp regular and unlabored, crackles in all lobes.  Heart: RRR, S1, S2, no S3, S4, or murmur; no rub. Abdomen: Soft, non-tender, non-distended, BS + x 4.  Extremities: No clubbing, cyanosis, + 1 pretibial edema.  Neuro: Alert and oriented X 3. Moves all extremities spontaneously. Psych: Normal affect.  LABS: CBC: Recent Labs  10/22/15 0353 10/23/15 0149  WBC 13.6* 14.1*   NEUTROABS 11.1*  --   HGB 7.8* 7.3*  HCT 22.7* 22.0*  MCV 82.2 84.6  PLT 357 323   Basic Metabolic Panel: Recent Labs  10/22/15 0353 10/23/15 0149  NA 129* 132*  K 3.2* 3.8  CL 92* 97*  CO2 21* 21*  GLUCOSE 102* 93  BUN 128* 89*  CREATININE 13.35* 10.58*  CALCIUM 8.6* 8.6*  PHOS 8.7* 6.0*   Liver Function Tests: Recent Labs  10/22/15 0353 10/23/15 0149  AST  --  18  ALT  --  8*  ALKPHOS  --  34*  BILITOT  --  0.8  PROT  --  6.7  ALBUMIN 3.1* 3.0*     Current facility-administered medications:  .  0.9 %  sodium chloride infusion, , Intravenous, Continuous, Terrial Rhodes, MD, Last Rate: 100 mL/hr at 10/22/15 1732 .  0.9 %  sodium chloride infusion, 100 mL, Intravenous, PRN, Terrial Rhodes, MD .  0.9 %  sodium chloride infusion, 100 mL, Intravenous, PRN, Terrial Rhodes, MD .  acetaminophen (TYLENOL) tablet 650 mg, 650 mg, Oral, Q6H PRN, 650 mg at 10/22/15 1144 **OR** acetaminophen (TYLENOL) suppository 650 mg, 650 mg, Rectal, Q6H PRN, Lesle Chris Black, NP .  amLODipine (NORVASC) tablet 10 mg, 10 mg, Oral, Daily, Lesle Chris Black, NP, 10 mg at 10/23/15 0920 .  atorvastatin (LIPITOR) tablet 20 mg, 20 mg, Oral, q1800, Richarda Overlie,  MD .  carvedilol (COREG) tablet 25 mg, 25 mg, Oral, BID WC, Beryle LatheJames Deterding, MD, 25 mg at 10/23/15 0920 .  cloNIDine (CATAPRES) tablet 0.1 mg, 0.1 mg, Oral, BID, Inez CatalinaEmily B Mullen, MD, 0.1 mg at 10/23/15 [id number] .  famotidine (PEPCID) tablet 20 mg, 20 mg, Oral, QHS, Inez CatalinaEmily B Mullen, MD, 20 mg at 10/22/15 2049 .  furosemide (LASIX) tablet 80 mg, 80 mg, Oral, BID, Terrial RhodesJoseph Coladonato, MD, 80 mg at 10/23/15 0920 .  gi cocktail (Maalox,Lidocaine,Donnatal), 30 mL, Oral, Q6H PRN, Dorothea OgleIskra M Myers, MD, 30 mL at 10/22/15 2241 .  heparin injection 2,300 Units, 20 Units/kg, Dialysis, PRN, Terrial RhodesJoseph Coladonato, MD .  heparin injection 5,000 Units, 5,000 Units, Subcutaneous, Q8H, Dorothea OgleIskra M Myers, MD, 5,000 Units at 10/23/15 [phone] .  hydrALAZINE (APRESOLINE) injection 20  mg, 20 mg, Intravenous, Q6H PRN, Michael LitterNikki Carter, MD, 20 mg at 10/18/15 1456 .  hydrALAZINE (APRESOLINE) tablet 100 mg, 100 mg, Oral, Q8H, Arita Missyan B Sanford, MD, 100 mg at 10/23/15 0640 .  HYDROcodone-acetaminophen (NORCO/VICODIN) 5-325 MG per tablet 1-2 tablet, 1-2 tablet, Oral, Q4H PRN, Gwenyth BenderKaren M Black, NP, 2 tablet at 10/22/15 2049 .  metoprolol (LOPRESSOR) injection 5 mg, 5 mg, Intravenous, Q6H PRN, Dorothea OgleIskra M Myers, MD, 5 mg at 10/19/15 0230 .  minoxidil (LONITEN) tablet 7.5 mg, 7.5 mg, Oral, BID, Arita Missyan B Sanford, MD, 7.5 mg at 10/22/15 2050 .  morphine 2 MG/ML injection 1-2 mg, 1-2 mg, Intravenous, Q4H PRN, Dorothea OgleIskra M Myers, MD, 2 mg at 10/23/15 0740 .  ondansetron (ZOFRAN) tablet 4 mg, 4 mg, Oral, Q6H PRN, 4 mg at 10/19/15 2239 **OR** ondansetron (ZOFRAN) injection 4 mg, 4 mg, Intravenous, Q6H PRN, Gwenyth BenderKaren M Black, NP, 4 mg at 10/22/15 1739 .  pantoprazole (PROTONIX) EC tablet 40 mg, 40 mg, Oral, Q0600, Kathlen ModyVijaya Akula, MD, 40 mg at 10/22/15 1048 .  polyethylene glycol (MIRALAX / GLYCOLAX) packet 17 g, 17 g, Oral, Daily PRN, Lesle ChrisKaren M Black, NP .  sevelamer carbonate (RENVELA) tablet 1,600 mg, 1,600 mg, Oral, TID WC, Arita Missyan B Sanford, MD, 1,600 mg at 10/22/15 1735 .  spironolactone (ALDACTONE) tablet 25 mg, 25 mg, Oral, Daily, Lars MassonKatarina H Nelson, MD, 25 mg at 10/23/15 0920 .  verapamil (CALAN-SR) CR tablet 240 mg, 240 mg, Oral, Daily, Lars MassonKatarina H Nelson, MD, 240 mg at 10/22/15 1044 . sodium chloride 100 mL/hr at 10/22/15 1732   TELE: NSR       ECG:  NSR   Radiology/Studies: Dg Chest 2 View  10/22/2015  CLINICAL DATA:  Fever, cough. EXAM: CHEST  2 VIEW COMPARISON:  None. FINDINGS: The heart size and mediastinal contours are within normal limits. Left lung is clear. No pneumothorax or significant pleural effusion is noted. Right internal jugular dialysis catheter is noted with distal tip in expected position of the SVC. Right perihilar and basilar opacity is noted concerning for edema or possibly pneumonia. The  visualized skeletal structures are unremarkable. IMPRESSION: Right perihilar and basilar opacity concerning for edema or pneumonia. Follow-up radiographs are recommended. Electronically Signed   By: Lupita RaiderJames  Green Jr, M.D.   On: 10/22/2015 15:37     Current Medications:  . amLODipine  10 mg Oral Daily  . atorvastatin  20 mg Oral q1800  . carvedilol  25 mg Oral BID WC  . cloNIDine  0.1 mg Oral BID  . famotidine  20 mg Oral QHS  . furosemide  80 mg Oral BID  . heparin subcutaneous  5,000 Units Subcutaneous Q8H  . hydrALAZINE  100  mg Oral Q8H  . minoxidil  7.5 mg Oral BID  . pantoprazole  40 mg Oral Q0600  . sevelamer carbonate  1,600 mg Oral TID WC  . spironolactone  25 mg Oral Daily  . verapamil  240 mg Oral Daily   . sodium chloride 100 mL/hr at 10/22/15 1732    ASSESSMENT AND PLAN: Principal Problem:   CKD (chronic kidney disease) Active Problems:   HTN (hypertension)   Acute kidney injury (HCC)   Thrombocytopenia (HCC)   Hematuria   Leukocytosis   Accelerated hypertension   Streptococcal sore throat   Post-streptococcal glomerulonephritis   Essential hypertension   AKI (acute kidney injury) (HCC)  1. Chest pain: Continues to have chest pain with HD, chest x ray repeated yesterday, catheter is in proper position, no pneumothorax. His initial EKG did have changes, they are consistent with LVH with repolarization abnormality. Will repeat now. Primary team has ordered repeat troponin.    His echo is reassuring and does not reveal any wall motion abnormalities.He is in acute distress currently, having some dyspnea, primary team is giving IV diuresis.   2. Malignant HTN: Blood pressure remains elevated, on multiple agents. He is not on an ACE-I/ARB as there is hope for renal recovery. 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   3. Acute kidney injury: Nephrology following. He has not  tolerated HD well, has chest pain with minimal fluid removal.    Signed, Little Ishikawa , NP 9:21 AM 10/23/2015 Pager [phone]  Patient examined chart reviewed Still producing some urine. Initial dialysis attempt had lightheadedness and dizziness Next attempt had SSCP that was worse after dialysis stopped. Has dialysis catheter right subclavian. Now has increasing respiratory distress.  CXR with marked right sided changes Hct has dropped from 34 to 22  Denies history  Of sickle cell disease. Discussed with Dr Rich Reining  Needs dialysis Consider transfer to unit with iv dilaudid and nitro To facilitate dialysis. Not clear if there was some trauma with catheter insertion and bleeding to right lung but concerned That patient has such asymmetric right sided lung changes with drop in Hct  Usually asymmetric edema is in older patients with underlying lung disease    Pain is not likely to be cardiac outside of Pericarditis Echo with normal EF and ECG likely represents LVH.    Charlton Haws

## 2015-10-23 NOTE — Procedures (Signed)
I was present at this dialysis session. I have reviewed the session itself and made appropriate changes.  He developed worsening respiratory distress throughout the day and brought in for urgent dialysis with UF to see if this would help his SOB and work of breathing.  Filed Weights   10/22/15 0720 10/22/15 2008 10/23/15 1303  Weight: 117.5 kg (259 lb 0.7 oz) 119.614 kg (263 lb 11.2 oz) 121.7 kg (268 lb 4.8 oz)     Recent Labs Lab 10/23/15 0149  NA 132*  K 3.8  CL 97*  CO2 21*  GLUCOSE 93  BUN 89*  CREATININE 10.58*  CALCIUM 8.6*  PHOS 6.0*     Recent Labs Lab 10/21/15 0348 10/22/15 0353 10/23/15 0149  WBC 16.3* 13.6* 14.1*  NEUTROABS  --  11.1*  --   HGB 8.8* 7.8* 7.3*  HCT 25.2* 22.7* 22.0*  MCV 81.3 82.2 84.6  PLT 422* 357 323    Scheduled Meds: . ALPRAZolam      . amLODipine  10 mg Oral Daily  . atorvastatin  20 mg Oral q1800  . carvedilol  25 mg Oral BID WC  . ceFEPime (MAXIPIME) IV  2 g Intravenous Once  . cloNIDine  0.1 mg Oral BID  . famotidine  20 mg Oral QHS  . furosemide  80 mg Intravenous Q8H  . heparin subcutaneous  5,000 Units Subcutaneous Q8H  . hydrALAZINE  100 mg Oral Q8H  . levalbuterol  1.25 mg Nebulization Q8H  . methylPREDNISolone (SOLU-MEDROL) injection  40 mg Intravenous Q12H  . minoxidil  7.5 mg Oral BID  . nitroGLYCERIN  0.2 mg Transdermal Once  . pantoprazole  40 mg Oral Q0600  . sevelamer carbonate  1,600 mg Oral TID WC  . spironolactone  25 mg Oral Daily  . vancomycin  1,500 mg Intravenous Once  . verapamil  240 mg Oral Daily   Continuous Infusions: . sodium chloride 100 mL/hr at 10/23/15 1000  . nitroGLYCERIN     PRN Meds:.sodium chloride, sodium chloride, acetaminophen **OR** acetaminophen, gi cocktail, heparin, hydrALAZINE, HYDROcodone-acetaminophen, metoprolol, morphine injection, ondansetron **OR** ondansetron (ZOFRAN) IV, polyethylene glycol   Irena CordsJoseph A Alayia Meggison,  MD 10/23/2015, 3:10 PM

## 2015-10-23 NOTE — Progress Notes (Signed)
Cathie RN called for pt reporting non radiating chest pain to HD cath site. Pt given morphine 2 mg IVP PTA pt then reported "hard time catching his breath" continued to deny chest pain. PTA RN had placed on 2L [place] and given Vicodin x2. Pt alert and oriented x4. Pt had dialysis today in which his symptoms started after treatment. This seems to be his norm per RN's on floor. Pt is new to dialysis. Pt assisted to a standing position, used urinal and returned to bed with no assistance. HOB elevated to 30 degrees, Lungs clear bilateral, denied CP, reported he was starting to feel better. BP 180/75, HR 100, 95% 2L, RR 20.

## 2015-10-24 ENCOUNTER — Telehealth: Payer: Self-pay | Admitting: Cardiovascular Disease

## 2015-10-24 DIAGNOSIS — R918 Other nonspecific abnormal finding of lung field: Secondary | ICD-10-CM

## 2015-10-24 DIAGNOSIS — J9601 Acute respiratory failure with hypoxia: Secondary | ICD-10-CM

## 2015-10-24 LAB — CBC
HEMATOCRIT: 23.1 % — AB (ref 39.0–52.0)
HEMOGLOBIN: 7.6 g/dL — AB (ref 13.0–17.0)
MCH: 28.1 pg (ref 26.0–34.0)
MCHC: 32.9 g/dL (ref 30.0–36.0)
MCV: 85.6 fL (ref 78.0–100.0)
Platelets: 448 10*3/uL — ABNORMAL HIGH (ref 150–400)
RBC: 2.7 MIL/uL — ABNORMAL LOW (ref 4.22–5.81)
RDW: 13.5 % (ref 11.5–15.5)
WBC: 18.8 10*3/uL — AB (ref 4.0–10.5)

## 2015-10-24 LAB — RENAL FUNCTION PANEL
ANION GAP: 12 (ref 5–15)
Albumin: 2.7 g/dL — ABNORMAL LOW (ref 3.5–5.0)
BUN: 70 mg/dL — ABNORMAL HIGH (ref 6–20)
CHLORIDE: 97 mmol/L — AB (ref 101–111)
CO2: 23 mmol/L (ref 22–32)
CREATININE: 8.46 mg/dL — AB (ref 0.61–1.24)
Calcium: 8.8 mg/dL — ABNORMAL LOW (ref 8.9–10.3)
GFR, EST AFRICAN AMERICAN: 9 mL/min — AB (ref >60)
GFR, EST NON AFRICAN AMERICAN: 8 mL/min — AB (ref >60)
Glucose, Bld: 121 mg/dL — ABNORMAL HIGH (ref 65–99)
POTASSIUM: 4.8 mmol/L (ref 3.5–5.1)
Phosphorus: 6.3 mg/dL — ABNORMAL HIGH (ref 2.5–4.6)
Sodium: 132 mmol/L — ABNORMAL LOW (ref 135–145)

## 2015-10-24 LAB — PROCALCITONIN: Procalcitonin: 2.96 ng/mL

## 2015-10-24 MED ORDER — CEFEPIME HCL 1 G IJ SOLR
1.0000 g | INTRAMUSCULAR | Status: DC
  Administered 2015-10-24 – 2015-10-25 (×2): 1 g via INTRAVENOUS
  Filled 2015-10-24 (×4): qty 1

## 2015-10-24 MED ORDER — LEVALBUTEROL HCL 1.25 MG/0.5ML IN NEBU: 1.2500 mg | INHALATION_SOLUTION | RESPIRATORY_TRACT | Status: DC | PRN

## 2015-10-24 MED ORDER — SODIUM CHLORIDE 0.9% FLUSH: 10.0000 mL | INTRAVENOUS | Status: DC | PRN

## 2015-10-24 MED ORDER — LEVALBUTEROL HCL 1.25 MG/0.5ML IN NEBU
1.2500 mg | INHALATION_SOLUTION | Freq: Three times a day (TID) | RESPIRATORY_TRACT | Status: DC
  Administered 2015-10-24: 1.25 mg via RESPIRATORY_TRACT
  Filled 2015-10-24: qty 0.5

## 2015-10-24 MED ORDER — VANCOMYCIN HCL IN DEXTROSE 1-5 GM/200ML-% IV SOLN
1000.0000 mg | Freq: Once | INTRAVENOUS | Status: AC
  Administered 2015-10-24: 1000 mg via INTRAVENOUS
  Filled 2015-10-24: qty 200

## 2015-10-24 MED ORDER — SODIUM CHLORIDE 0.9% FLUSH
10.0000 mL | Freq: Two times a day (BID) | INTRAVENOUS | Status: DC
  Administered 2015-10-24: 10 mL
  Administered 2015-10-24: 20 mL
  Administered 2015-10-25 – 2015-10-30 (×2): 10 mL

## 2015-10-24 MED ORDER — CETYLPYRIDINIUM CHLORIDE 0.05 % MT LIQD
7.0000 mL | Freq: Two times a day (BID) | OROMUCOSAL | Status: DC
  Administered 2015-10-24: 7 mL via OROMUCOSAL

## 2015-10-24 MED ORDER — CETYLPYRIDINIUM CHLORIDE 0.05 % MT LIQD
7.0000 mL | Freq: Two times a day (BID) | OROMUCOSAL | Status: DC
  Administered 2015-10-24 – 2015-10-30 (×8): 7 mL via OROMUCOSAL

## 2015-10-24 MED ORDER — CHLORHEXIDINE GLUCONATE 0.12 % MT SOLN
15.0000 mL | Freq: Two times a day (BID) | OROMUCOSAL | Status: DC
  Administered 2015-10-24: 15 mL via OROMUCOSAL
  Filled 2015-10-24: qty 15

## 2015-10-24 NOTE — Progress Notes (Signed)
Patient ID: Edsel PetrinLaquan Lockhart, male   DOB: July 31, 1990, 25 y.o.   MRN: [id number]     Patient Name: Edsel PetrinLaquan Lockhart Date of Encounter: 10/24/2015  Principal Problem:   CKD (chronic kidney disease) Active Problems:   HTN (hypertension)   Acute kidney injury (HCC)   Thrombocytopenia (HCC)   Hematuria   Leukocytosis   Accelerated hypertension   Streptococcal sore throat   Post-streptococcal glomerulonephritis   Essential hypertension   AKI (acute kidney injury) [facility](HCC)   Primary Cardiologist: New - Dr. Eden EmmsNishan Patient Profile: Mr. [name] is a 57M with malignant hypertension admitted with acute on chronic kidney failure and newly initiated HD. Cardiology consulted for chest pain and elevated troponin. His symptoms resolved with in/out foley catheterization and GERD treatment.   SUBJECTIVE: Some less dyspnic but on NRB  Had UF with over 2 liters removed yesterday    OBJECTIVE Filed Vitals:   10/24/15 0350 10/24/15 0400 10/24/15 0508 10/24/15 0755  BP: 127/69 144/83 124/60 135/74  Pulse: 93 99  92  Temp:  99.8 F (37.7 C)  99.4 F (37.4 C)  TempSrc:  Axillary  Axillary  Resp: 25 22  22   Height:      Weight:   261 lb 6.4 oz (118.57 kg)   SpO2: 100% 99%  99%    Intake/Output Summary (Last 24 hours) at 10/24/15 0830 Last data filed at 10/24/15 0600  Gross per 24 hour  Intake   1534 ml  Output   2904 ml  Net  -1370 ml   Filed Weights   10/23/15 1539 10/23/15 1600 10/24/15 0508  Weight: 262 lb 12.6 oz (119.2 kg) 262 lb 4.8 oz (118.978 kg) 261 lb 6.4 oz (118.57 kg)    PHYSICAL EXAM Affect appropriate Obese black male  HEENT: normal Neck supple with no adenopathy JVP normal no bruits no thyromegaly  Right sublavian dialysis catheter  Lungs clear with no wheezing and good diaphragmatic motion Heart:  S1/S2 no murmur, no rub, gallop or click PMI normal Abdomen: benighn, BS positve, no tenderness, no AAA no bruit.  No HSM or HJR Distal pulses intact with no bruits Plus  one  edema Neuro non-focal Skin warm and dry No muscular weakness   LABS: CBC:  Recent Labs  10/22/15 0353 10/23/15 0149  WBC 13.6* 14.1*  NEUTROABS 11.1*  --   HGB 7.8* 7.3*  HCT 22.7* 22.0*  MCV 82.2 84.6  PLT 357 323   Basic Metabolic Panel:  Recent Labs  56/21/3007/18/17 0149 10/24/15 0220  NA 132* 132*  K 3.8 4.8  CL 97* 97*  CO2 21* 23  GLUCOSE 93 121*  BUN 89* 70*  CREATININE 10.58* 8.46*  CALCIUM 8.6* 8.8*  PHOS 6.0* 6.3*   Liver Function Tests:  Recent Labs  10/23/15 0149 10/24/15 0220  AST 18  --   ALT 8*  --   ALKPHOS 34*  --   BILITOT 0.8  --   PROT 6.7  --   ALBUMIN 3.0* 2.7*     Current facility-administered medications:  .  0.9 %  sodium chloride infusion, , Intravenous, Continuous, Terrial RhodesJoseph Coladonato, MD, Last Rate: 100 mL/hr at 10/23/15 1000 .  0.9 %  sodium chloride infusion, 100 mL, Intravenous, PRN, Terrial RhodesJoseph Coladonato, MD .  0.9 %  sodium chloride infusion, 100 mL, Intravenous, PRN, Terrial RhodesJoseph Coladonato, MD .  acetaminophen (TYLENOL) tablet 650 mg, 650 mg, Oral, Q6H PRN, 650 mg at 10/23/15 2217 **OR** acetaminophen (TYLENOL) suppository 650 mg, 650 mg, Rectal, Q6H PRN,  Lesle Chris Black, NP .  amLODipine (NORVASC) tablet 10 mg, 10 mg, Oral, Daily, Lesle Chris Black, NP, 10 mg at 10/23/15 0920 .  antiseptic oral rinse (CPC / CETYLPYRIDINIUM CHLORIDE 0.05%) solution 7 mL, 7 mL, Mouth Rinse, q12n4p, Richarda Overlie, MD .  atorvastatin (LIPITOR) tablet 20 mg, 20 mg, Oral, q1800, Richarda Overlie, MD, 20 mg at 10/23/15 1808 .  carvedilol (COREG) tablet 25 mg, 25 mg, Oral, BID WC, Beryle Lathe, MD, 25 mg at 10/23/15 1800 .  ceFEPIme (MAXIPIME) 2 g in dextrose 5 % 50 mL IVPB, 2 g, Intravenous, Once, Richarda Overlie, MD .  chlorhexidine (PERIDEX) 0.12 % solution 15 mL, 15 mL, Mouth Rinse, BID, Richarda Overlie, MD .  cloNIDine (CATAPRES) tablet 0.1 mg, 0.1 mg, Oral, BID, Inez Catalina, MD, 0.1 mg at 10/23/15 2217 .  famotidine (PEPCID) tablet 20 mg, 20 mg, Oral, QHS, Inez Catalina, MD, 20 mg at 10/23/15 2217 .  furosemide (LASIX) 120 mg in dextrose 5 % 50 mL IVPB, 120 mg, Intravenous, Q8H, Richarda Overlie, MD, 120 mg at 10/24/15 0519 .  gi cocktail (Maalox,Lidocaine,Donnatal), 30 mL, Oral, Q6H PRN, Dorothea Ogle, MD, 30 mL at 10/22/15 2241 .  heparin injection 2,300 Units, 20 Units/kg, Dialysis, PRN, Terrial Rhodes, MD .  heparin injection 5,000 Units, 5,000 Units, Subcutaneous, Q8H, Dorothea Ogle, MD, 5,000 Units at 10/24/15 0508 .  hydrALAZINE (APRESOLINE) injection 20 mg, 20 mg, Intravenous, Q6H PRN, Michael Litter, MD, 20 mg at 10/18/15 1456 .  hydrALAZINE (APRESOLINE) tablet 100 mg, 100 mg, Oral, Q8H, Arita Miss, MD, 100 mg at 10/24/15 0508 .  HYDROcodone-acetaminophen (NORCO/VICODIN) 5-325 MG per tablet 1-2 tablet, 1-2 tablet, Oral, Q4H PRN, Gwenyth Bender, NP, 2 tablet at 10/22/15 2049 .  levalbuterol (XOPENEX) nebulizer solution 1.25 mg, 1.25 mg, Nebulization, TID, Richarda Overlie, MD .  methylPREDNISolone sodium succinate (SOLU-MEDROL) 125 mg/2 mL injection 60 mg, 60 mg, Intravenous, Q8H, Richarda Overlie, MD, 60 mg at 10/24/15 0507 .  metoprolol (LOPRESSOR) injection 5 mg, 5 mg, Intravenous, Q6H PRN, Dorothea Ogle, MD, 5 mg at 10/19/15 0230 .  minoxidil (LONITEN) tablet 7.5 mg, 7.5 mg, Oral, BID, Arita Miss, MD, 7.5 mg at 10/23/15 2216 .  morphine 2 MG/ML injection 1-2 mg, 1-2 mg, Intravenous, Q4H PRN, Dorothea Ogle, MD, 2 mg at 10/23/15 0740 .  nitroGLYCERIN (NITRODUR - Dosed in mg/24 hr) patch 0.2 mg, 0.2 mg, Transdermal, Once, Richarda Overlie, MD, 0.2 mg at 10/23/15 1245 .  nitroGLYCERIN 50 mg in dextrose 5 % 250 mL (0.2 mg/mL) infusion, 0-200 mcg/min, Intravenous, Titrated, Richarda Overlie, MD .  ondansetron (ZOFRAN) tablet 4 mg, 4 mg, Oral, Q6H PRN, 4 mg at 10/19/15 2239 **OR** ondansetron (ZOFRAN) injection 4 mg, 4 mg, Intravenous, Q6H PRN, Gwenyth Bender, NP, 4 mg at 10/22/15 1739 .  pantoprazole (PROTONIX) EC tablet 40 mg, 40 mg, Oral, Q0600, Kathlen Mody,  MD, 40 mg at 10/24/15 0509 .  polyethylene glycol (MIRALAX / GLYCOLAX) packet 17 g, 17 g, Oral, Daily PRN, Lesle Chris Black, NP .  sevelamer carbonate (RENVELA) tablet 1,600 mg, 1,600 mg, Oral, TID WC, Arita Miss, MD, 1,600 mg at 10/23/15 1800 .  sodium chloride flush (NS) 0.9 % injection 10-40 mL, 10-40 mL, Intracatheter, Q12H, Richarda Overlie, MD .  sodium chloride flush (NS) 0.9 % injection 10-40 mL, 10-40 mL, Intracatheter, PRN, Richarda Overlie, MD .  spironolactone (ALDACTONE) tablet 25 mg, 25 mg, Oral, Daily, Lars Masson, MD, 25  mg at 10/23/15 0920 .  verapamil (CALAN-SR) CR tablet 240 mg, 240 mg, Oral, Daily, Lars Masson, MD, 240 mg at 10/23/15 0921 . sodium chloride 100 mL/hr at 10/23/15 1000  . nitroGLYCERIN     TELE: NSR       ECG:  NSR LVH with strain   Radiology/Studies: Dg Chest 2 View  10/23/2015  CLINICAL DATA:  Patient with persistent shortness of breath since dialysis. Mid sternal chest pain. EXAM: CHEST  2 VIEW COMPARISON:  10/22/2015 chest radiograph FINDINGS: Central venous catheter tip projects over the superior vena cava. Monitoring leads overlie the patient. Stable cardiomegaly. Significant interval worsening of diffuse consolidation throughout the right lung. Left lung is clear. Small right pleural effusion. Regional skeleton is unremarkable. IMPRESSION: Significant worsening diffuse airspace opacification of the right hemi thorax concerning for pneumonia or potentially asymmetric edema. Continued radiographic followup to ensure resolution is recommended. Small right pleural effusion. Electronically Signed   By: Annia Belt M.D.   On: 10/23/2015 09:40   Dg Chest 2 View  10/22/2015  CLINICAL DATA:  Fever, cough. EXAM: CHEST  2 VIEW COMPARISON:  None. FINDINGS: The heart size and mediastinal contours are within normal limits. Left lung is clear. No pneumothorax or significant pleural effusion is noted. Right internal jugular dialysis catheter is noted with distal tip  in expected position of the SVC. Right perihilar and basilar opacity is noted concerning for edema or possibly pneumonia. The visualized skeletal structures are unremarkable. IMPRESSION: Right perihilar and basilar opacity concerning for edema or pneumonia. Follow-up radiographs are recommended. Electronically Signed   By: Lupita Raider, M.D.   On: 10/22/2015 15:37   Ct Chest Wo Contrast  10/23/2015  CLINICAL DATA:  Extreme shortness of breath. History of hypertension, chronic kidney disease. EXAM: CT CHEST WITHOUT CONTRAST TECHNIQUE: Multidetector CT imaging of the chest was performed following the standard protocol without IV contrast. COMPARISON:  Chest radiograph October 23, 2015 at 0840 hours FINDINGS: Cardiovascular: Heart size is normal. Trace pericardial effusion. Thoracic aorta is normal course and caliber. Mediastinum/Nodes: Limited assessment for lymphadenopathy by noncontrast examination. Minimal residual thymic tissue. Tunneled dialysis catheter via RIGHT internal jugular venous approach with distal tip at cavoatrial junction. Lungs/Pleura: Dense consolidation and ground-glass opacities predominately in RIGHT lung, to a lesser extent LEFT lower lobe with small bilateral pleural effusions. RIGHT lower lobe with air bronchograms. Tracheobronchial tree is patent and midline. Upper Abdomen: Dense LEFT subcapsular renal hematoma measuring up to 3 cm in greatest depth, with small amount of perinephric dense fat stranding. Musculoskeletal: Normal. IMPRESSION: Dense consolidation RIGHT greater than LEFT lungs compatible with pneumonia and/or pulmonary edema. Small pleural effusions. LEFT renal subcapsular hematoma and small amount of LEFT retroperitoneal hemorrhage partially imaged. These results will be called to the ordering clinician or representative by the Radiologist Assistant, and communication documented in the PACS or zVision Dashboard. Electronically Signed   By: Awilda Metro M.D.   On:  10/23/2015 12:11   Dg Chest Port 1 View  10/23/2015  CLINICAL DATA:  Shortness of breath EXAM: PORTABLE CHEST 1 VIEW COMPARISON:  October 23, 2015 FINDINGS: A right-sided double lumen catheter is identified. The opacity in the right lung has worsened in the interval with increasing density and suggestion of early air bronchograms. The left lung remains clear. No other acute abnormalities. IMPRESSION: Worsening infiltrate in the right lung. Recommend follow-up to resolution. Electronically Signed   By: Gerome Sam III M.D   On: 10/23/2015 20:34  Current Medications:  . amLODipine  10 mg Oral Daily  . antiseptic oral rinse  7 mL Mouth Rinse q12n4p  . atorvastatin  20 mg Oral q1800  . carvedilol  25 mg Oral BID WC  . ceFEPime (MAXIPIME) IV  2 g Intravenous Once  . chlorhexidine  15 mL Mouth Rinse BID  . cloNIDine  0.1 mg Oral BID  . famotidine  20 mg Oral QHS  . furosemide  120 mg Intravenous Q8H  . heparin subcutaneous  5,000 Units Subcutaneous Q8H  . hydrALAZINE  100 mg Oral Q8H  . levalbuterol  1.25 mg Nebulization TID  . methylPREDNISolone (SOLU-MEDROL) injection  60 mg Intravenous Q8H  . minoxidil  7.5 mg Oral BID  . nitroGLYCERIN  0.2 mg Transdermal Once  . pantoprazole  40 mg Oral Q0600  . sevelamer carbonate  1,600 mg Oral TID WC  . sodium chloride flush  10-40 mL Intracatheter Q12H  . spironolactone  25 mg Oral Daily  . verapamil  240 mg Oral Daily   . sodium chloride 100 mL/hr at 10/23/15 1000  . nitroGLYCERIN      ASSESSMENT AND PLAN: Principal Problem:   CKD (chronic kidney disease) Active Problems:   HTN (hypertension)   Acute kidney injury (HCC)   Thrombocytopenia (HCC)   Hematuria   Leukocytosis   Accelerated hypertension   Streptococcal sore throat   Post-streptococcal glomerulonephritis   Essential hypertension   AKI (acute kidney injury) (HCC)  1. Chest pain: improved continue iv nitro for now more for preload reduction and edema.  Minimal bump in  troponin not significant in setting of renal failure EF normal by echo With no RWMA;s      2. Malignant HTN:  Improved with less respiratory distress   3. Acute kidney injury: Nephrology following. Continue UF/dialysis   4. ARDS:  Etiology of acute pneumonitis not entirely clear Asymmetric right lung worse  continue diuresis , steroids and bipap.  F/U CXR this am   Charlton Haws

## 2015-10-24 NOTE — Progress Notes (Signed)
Pt transported to dialysis on Bipap. MD advised to try off Bipap. Pt placed on 55% venti mask in dialysis. RT will continue to monitor.

## 2015-10-24 NOTE — Progress Notes (Signed)
PROGRESS NOTE    Joseph Villegas  ZOX:[id number] DOB: 17-Feb-1991 DOA: 10/08/2015 No PCP  Brief Narrative:   Joseph Villegas is a 25 y.o. 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 He was admitted 07/03 with 1wk hx of fever, cough, sore throat, weakness. He had rapid strep + and was started on amoxicillin. He also had hypertensive urgency, outpatient meds resumed (doses adjusted by cards) and clonidine added. Echo revealed severe LVH likely due to long standing HTN.  Over the course of his hospitalization, he has had worsening SOB and increase in weight from 240lbs on admit (07/03) to 261lbs on 07/19.  On 7/18, he had acute worsening of SOB along with increase in O2 needs. He had CT of the chest that revealed dense consolidation R > L along with small pleural effusions bilaterally. Findings most consistent with combo of pneumonia and pulmonary edema. 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 - Renal ultrasound suggested CKD from HTN and ATN - Renal biopsy done 7/10 - Negative GN work up renal biopsy revealed TMA with ATN,Possibility of recovery however worsening prerenal azotemia and volume overload.Started on HD 10/22/15 due to persistence of nausea and anorexia but then developed pulmonary edema/infiltrate Continue to follow closely for recovery - HD done on 7/11, 7/17, 7/18 - Nephrology following Worsening shortness of breath and Lasix has been increased up to 120  mg IV every 8 hours  Accelerated HTN with urgency - BP today still significantly high, with systolic ranging from 140s - 190s - On amlodipine, coreg, lasix, Hydralazine, minoxidil,  verapamil, clonidine, Lasix - Add clonidine 0.1 BID On echo he has severe LVH and an intracavitary gradient. He also has a murmur on exam. This is due to his hypertension and LVH, not HCM. The physiology is similar.   Acute hypoxic respiratory failure - likely multifactorial in the setting of HCAP + volume overload / pulm edema Concern for pulmonary hemorrhage as complication following right IJ HD cath placement 7/10 > highly doubt so given no hx hemoptysis at all. CT most c/w PNA and vol overload.   patient's respiratory status is slowly improving with dialysis but is still requiring BiPAP and 60% FiO2. DDx of pulmonary edema vs HCAP (air bronchograms on CT) vs pulmonary hemorrhage post line placement To bronch will require intubation right now, even if pulmonary hemorrhage treatment will be supportive so risk well outweighs the benefit for bronchoscopy. No indication for bronchoscopy per PCCM tx for HCAP with cefepime and vanc   Chest pain: Symptoms seem to have been related to GERD. Although his EKG does have changes, they are consistent with LVH with repolarization abnormality. His echo is reassuring and does not reveal any wall motion abnormalities. Troponin is elevated, but stable and he has AoCKD. Given that he is 24 the likelihood of ACS is slim troponins  remained in the range of 0.8-0.95 , now trending down ECG with LVH. BP improved on Rx continue dialysis will arrange outpatient F/u with Dr Duke Salvia consider myovue in 6-8 weeks as per Wendall Stade, MD    Anemia-likely anemia of chronic disease Patient had a drop in hemoglobin from 12->8.8 on 7/16, now 7.6 Transfuse if  hemoglobin drops further  Strep throat - Treated with amoxicillin, completed  Acute urinary retention with spasms - Patient insistent that foley come out 7/15 -  - Continue morphine PRN pain   Hyponatremia - Stable and mild, likely due to renal failure  Hypokalemia 4.8 today, recheck in the  AM  Leukocytosis - Unclear cause, improved initially and now 18.8  Check pro-calcitonin - Started on treatment for healthcare associated pneumonia   DVT prophylaxis: Heparin Code Status: Full Family Communication: None today Disposition Plan: Disposition as per nephrology   Consultants:   Nephrology  IR  Cardiology  Procedures:  1. Renal biopsy by IR 7/10 2. IR placed R IJ TDC 7/10 3. Echocardiogram.  Antimicrobials:   Amoxicillin, completed course    Subjective: Remains on BiPAP,  Objective: Filed Vitals:   10/24/15 1232 10/24/15 1250 10/24/15 1255 10/24/15 1300  BP:  164/85 159/80 162/85  Pulse: 106 105 106 104  Temp:  98.4 F (36.9 C)    TempSrc:  Oral    Resp: 24 27    Height:      Weight:  121 kg (266 lb 12.1 oz)    SpO2: 92% 91%      Intake/Output Summary (Last 24 hours) at 10/24/15 1328 Last data filed at 10/24/15 0900  Gross per 24 hour  Intake   1014 ml  Output   2754 ml  Net  -1740 ml   Filed Weights   10/23/15 1600 10/24/15 0508 10/24/15 1250  Weight: 118.978 kg (262 lb 4.8 oz) 118.57 kg (261 lb 6.4 oz) 121 kg (266 lb 12.1 oz)    Examination:  General exam: Appears calm and comfortable  Eyes: EOMI, anicteric sclerae Respiratory system: Respiratory effort normal. No audible wheezing Cardiovascular system: RR, NR. No murmur Gastrointestinal system: Abdomen is nondistended, soft and nontender.  +BS Central nervous system: Alert and oriented. No focal neurological deficits. Skin: No new rashes, lesions or ulcers Psychiatry: Judgement and insight appear normal. Mood & affect appropriate.     Data Reviewed:  CBC:  Recent Labs Lab 10/19/15 0431 10/21/15 0348 10/22/15 0353 10/23/15 0149 10/24/15 1303  WBC 16.0* 16.3* 13.6* 14.1* 18.8*  NEUTROABS  --   --  11.1*  --   --   HGB 12.0* 8.8* 7.8* 7.3* 7.6*  HCT 34.1* 25.2* 22.7* 22.0* 23.1*  MCV 82.0 81.3 82.2 84.6 85.6  PLT 399 422* 357 323 448*   Basic Metabolic  Panel:  Recent Labs Lab 10/20/15 0320 10/21/15 0348 10/22/15 0353 10/23/15 0149 10/24/15 0220  NA 132* 129* 129* 132* 132*  K 3.1* 3.3* 3.2* 3.8 4.8  CL 83* 88* 92* 97* 97*  CO2 25 23 21* 21* 23  GLUCOSE 86 96 102* 93 121*  BUN 126* 131* 128* 89* 70*  CREATININE 13.72* 13.57* 13.35* 10.58* 8.46*  CALCIUM 9.5 8.8* 8.6* 8.6* 8.8*  PHOS 11.8* 10.1* 8.7* 6.0* 6.3*   GFR: Estimated Creatinine Clearance: 17.3 mL/min (by C-G formula based on Cr of 8.46). Liver Function Tests:  Recent Labs Lab 10/20/15 0320 10/21/15 0348 10/22/15 0353 10/23/15 0149 10/24/15 0220  AST  --   --   --  18  --   ALT  --   --   --  8*  --   ALKPHOS  --   --   --  34*  --   BILITOT  --   --   --  0.8  --   PROT  --   --   --  6.7  --   ALBUMIN 3.3* 3.2* 3.1* 3.0* 2.7*   No results for input(s): LIPASE, AMYLASE in the last 168 hours. No results for input(s): AMMONIA in the last 168 hours. Coagulation Profile: No results for input(s): INR, PROTIME in the last 168 hours. Cardiac Enzymes:  Recent Labs Lab 10/18/15 2227 10/19/15 0431 10/19/15 0812 10/23/15 0806 10/23/15 1855  TROPONINI 0.95* 0.91* 0.88* 0.30* 0.24*   BNP (last 3 results) No results for input(s): PROBNP in the last 8760 hours. HbA1C: No results for input(s): HGBA1C in the last 72 hours. CBG: No results for input(s): GLUCAP in the last 168 hours. Lipid Profile: No results for input(s): CHOL, HDL, LDLCALC, TRIG, CHOLHDL, LDLDIRECT in the last 72 hours.    Radiology Studies: Dg Chest 2 View  10/23/2015  CLINICAL DATA:  Patient with persistent shortness of breath since dialysis. Mid sternal chest pain. EXAM: CHEST  2 VIEW COMPARISON:  10/22/2015 chest radiograph FINDINGS: Central venous catheter tip projects over the superior vena cava. Monitoring leads overlie the patient. Stable cardiomegaly. Significant interval worsening of diffuse consolidation throughout the right lung. Left lung is clear. Small right pleural effusion.  Regional skeleton is unremarkable. IMPRESSION: Significant worsening diffuse airspace opacification of the right hemi thorax concerning for pneumonia or potentially asymmetric edema. Continued radiographic followup to ensure resolution is recommended. Small right pleural effusion. Electronically Signed   By: Annia Belt M.D.   On: 10/23/2015 09:40   Dg Chest 2 View  10/22/2015  CLINICAL DATA:  Fever, cough. EXAM: CHEST  2 VIEW COMPARISON:  None. FINDINGS: The heart size and mediastinal contours are within normal limits. Left lung is clear. No pneumothorax or significant pleural effusion is noted. Right internal jugular dialysis catheter is noted with distal tip in expected position of the SVC. Right perihilar and basilar opacity is noted concerning for edema or possibly pneumonia. The visualized skeletal structures are unremarkable. IMPRESSION: Right perihilar and basilar opacity concerning for edema or pneumonia. Follow-up radiographs are recommended. Electronically Signed   By: Lupita Raider, M.D.   On: 10/22/2015 15:37   Ct Chest Wo Contrast  10/23/2015  CLINICAL DATA:  Extreme shortness of breath. History of hypertension, chronic kidney disease. EXAM: CT CHEST WITHOUT CONTRAST TECHNIQUE: Multidetector CT imaging of the chest was performed following the standard protocol without IV contrast. COMPARISON:  Chest radiograph October 23, 2015 at 0840 hours FINDINGS: Cardiovascular: Heart size is normal. Trace pericardial effusion. Thoracic aorta is normal course and caliber. Mediastinum/Nodes: Limited assessment for lymphadenopathy by noncontrast examination. Minimal residual thymic tissue. Tunneled dialysis catheter via RIGHT internal jugular venous approach with distal tip at cavoatrial junction. Lungs/Pleura: Dense consolidation and ground-glass opacities predominately in RIGHT lung, to a lesser extent LEFT lower lobe with small bilateral pleural effusions. RIGHT lower lobe with air bronchograms.  Tracheobronchial tree is patent and midline. Upper Abdomen: Dense LEFT subcapsular renal hematoma measuring up to 3 cm in greatest depth, with small amount of perinephric dense fat stranding. Musculoskeletal: Normal. IMPRESSION: Dense consolidation RIGHT greater than LEFT lungs compatible with pneumonia and/or pulmonary edema. Small pleural effusions. LEFT renal subcapsular hematoma and small amount of LEFT retroperitoneal hemorrhage partially imaged. These results will be called to the ordering clinician or representative by the Radiologist Assistant, and communication documented in the PACS or zVision Dashboard. Electronically Signed   By: Awilda Metro M.D.   On: 10/23/2015 12:11   Dg Chest Port 1 View  10/23/2015  CLINICAL DATA:  Shortness of breath EXAM: PORTABLE CHEST  1 VIEW COMPARISON:  October 23, 2015 FINDINGS: A right-sided double lumen catheter is identified. The opacity in the right lung has worsened in the interval with increasing density and suggestion of early air bronchograms. The left lung remains clear. No other acute abnormalities. IMPRESSION: Worsening infiltrate in the right lung. Recommend follow-up to resolution. Electronically Signed   By: Gerome Sam III M.D   On: 10/23/2015 20:34        Scheduled Meds: . amLODipine  10 mg Oral Daily  . antiseptic oral rinse  7 mL Mouth Rinse q12n4p  . atorvastatin  20 mg Oral q1800  . carvedilol  25 mg Oral BID WC  . ceFEPime (MAXIPIME) IV  1 g Intravenous Q24H  . chlorhexidine  15 mL Mouth Rinse BID  . cloNIDine  0.1 mg Oral BID  . famotidine  20 mg Oral QHS  . furosemide  120 mg Intravenous Q8H  . heparin subcutaneous  5,000 Units Subcutaneous Q8H  . hydrALAZINE  100 mg Oral Q8H  . methylPREDNISolone (SOLU-MEDROL) injection  60 mg Intravenous Q8H  . minoxidil  7.5 mg Oral BID  . nitroGLYCERIN  0.2 mg Transdermal Once  . pantoprazole  40 mg Oral Q0600  . sevelamer carbonate  1,600 mg Oral TID WC  . sodium chloride flush   10-40 mL Intracatheter Q12H  . spironolactone  25 mg Oral Daily  . verapamil  240 mg Oral Daily   Continuous Infusions: . sodium chloride 100 mL/hr at 10/23/15 1000  . nitroGLYCERIN       LOS: 16 days    Time spent: 25 minutes    Richarda Overlie, MD [facility] Pager [phone]  If 7PM-7AM, please contact night-coverage [email or web address] Password [email or web address] 10/24/2015, 1:28 PM

## 2015-10-24 NOTE — Progress Notes (Addendum)
Patient ID: Joseph Villegas, male   DOB: 1991-02-09, 25 y.o.   MRN: [id number] S: Feeling better but on BiPap at the moment O:BP 135/74 mmHg  Pulse 92  Temp(Src) 99.4 F (37.4 C) (Axillary)  Resp 22  Ht 5\' 10"  (1.778 m)  Wt 118.57 kg (261 lb 6.4 oz)  BMI 37.51 kg/m2  SpO2 99%  Intake/Output Summary (Last 24 hours) at 10/24/15 0828 Last data filed at 10/24/15 0600  Gross per 24 hour  Intake   1534 ml  Output   2904 ml  Net  -1370 ml   Intake/Output: I/O last 3 completed shifts: In: 9964 [P.O.:1110; I.V.:8230; Other:500; IV Piggyback:124] Out: 3154 [Urine:650; Other:2504]  Intake/Output this shift:    Weight change: 4.2 kg (9 lb 4.2 oz) Gen:WD WN AAM wearing BiPap NWG:NFAOZCVS:tachy Resp:rhonchi HYQ:MVHQIOAbd:benign Ext:no edema   Recent Labs Lab 10/18/15 0536 10/19/15 0431 10/20/15 0320 10/21/15 0348 10/22/15 0353 10/23/15 0149 10/24/15 0220  NA 129* 127* 132* 129* 129* 132* 132*  K 3.1* 3.5 3.1* 3.3* 3.2* 3.8 4.8  CL 84* 81* 83* 88* 92* 97* 97*  CO2 25 25 25 23  21* 21* 23  GLUCOSE 121* 127* 86 96 102* 93 121*  BUN 112* 120* 126* 131* 128* 89* 70*  CREATININE 13.29* 13.67* 13.72* 13.57* 13.35* 10.58* 8.46*  ALBUMIN 3.5 3.9 3.3* 3.2* 3.1* 3.0* 2.7*  CALCIUM 9.5 9.6 9.5 8.8* 8.6* 8.6* 8.8*  PHOS 10.3* 11.3* 11.8* 10.1* 8.7* 6.0* 6.3*  AST  --   --   --   --   --  18  --   ALT  --   --   --   --   --  8*  --    Liver Function Tests:  Recent Labs Lab 10/22/15 0353 10/23/15 0149 10/24/15 0220  AST  --  18  --   ALT  --  8*  --   ALKPHOS  --  34*  --   BILITOT  --  0.8  --   PROT  --  6.7  --   ALBUMIN 3.1* 3.0* 2.7*   No results for input(s): LIPASE, AMYLASE in the last 168 hours. No results for input(s): AMMONIA in the last 168 hours. CBC:  Recent Labs Lab 10/18/15 0536 10/19/15 0431 10/21/15 0348 10/22/15 0353 10/23/15 0149  WBC 14.2* 16.0* 16.3* 13.6* 14.1*  NEUTROABS  --   --   --  11.1*  --   HGB 12.1* 12.0* 8.8* 7.8* 7.3*  HCT 33.6* 34.1* 25.2* 22.7*  22.0*  MCV 82.0 82.0 81.3 82.2 84.6  PLT 349 399 422* 357 323   Cardiac Enzymes:  Recent Labs Lab 10/18/15 2227 10/19/15 0431 10/19/15 0812 10/23/15 0806 10/23/15 1855  TROPONINI 0.95* 0.91* 0.88* 0.30* 0.24*   CBG: No results for input(s): GLUCAP in the last 168 hours.  Iron Studies: No results for input(s): IRON, TIBC, TRANSFERRIN, FERRITIN in the last 72 hours. Studies/Results: Dg Chest 2 View  10/23/2015  CLINICAL DATA:  Patient with persistent shortness of breath since dialysis. Mid sternal chest pain. EXAM: CHEST  2 VIEW COMPARISON:  10/22/2015 chest radiograph FINDINGS: Central venous catheter tip projects over the superior vena cava. Monitoring leads overlie the patient. Stable cardiomegaly. Significant interval worsening of diffuse consolidation throughout the right lung. Left lung is clear. Small right pleural effusion. Regional skeleton is unremarkable. IMPRESSION: Significant worsening diffuse airspace opacification of the right hemi thorax concerning for pneumonia or potentially asymmetric edema. Continued radiographic followup to ensure resolution is recommended.  Small right pleural effusion. Electronically Signed   By: Annia Belt M.D.   On: 10/23/2015 09:40   Dg Chest 2 View  10/22/2015  CLINICAL DATA:  Fever, cough. EXAM: CHEST  2 VIEW COMPARISON:  None. FINDINGS: The heart size and mediastinal contours are within normal limits. Left lung is clear. No pneumothorax or significant pleural effusion is noted. Right internal jugular dialysis catheter is noted with distal tip in expected position of the SVC. Right perihilar and basilar opacity is noted concerning for edema or possibly pneumonia. The visualized skeletal structures are unremarkable. IMPRESSION: Right perihilar and basilar opacity concerning for edema or pneumonia. Follow-up radiographs are recommended. Electronically Signed   By: Lupita Raider, M.D.   On: 10/22/2015 15:37   Ct Chest Wo Contrast  10/23/2015   CLINICAL DATA:  Extreme shortness of breath. History of hypertension, chronic kidney disease. EXAM: CT CHEST WITHOUT CONTRAST TECHNIQUE: Multidetector CT imaging of the chest was performed following the standard protocol without IV contrast. COMPARISON:  Chest radiograph October 23, 2015 at 0840 hours FINDINGS: Cardiovascular: Heart size is normal. Trace pericardial effusion. Thoracic aorta is normal course and caliber. Mediastinum/Nodes: Limited assessment for lymphadenopathy by noncontrast examination. Minimal residual thymic tissue. Tunneled dialysis catheter via RIGHT internal jugular venous approach with distal tip at cavoatrial junction. Lungs/Pleura: Dense consolidation and ground-glass opacities predominately in RIGHT lung, to a lesser extent LEFT lower lobe with small bilateral pleural effusions. RIGHT lower lobe with air bronchograms. Tracheobronchial tree is patent and midline. Upper Abdomen: Dense LEFT subcapsular renal hematoma measuring up to 3 cm in greatest depth, with small amount of perinephric dense fat stranding. Musculoskeletal: Normal. IMPRESSION: Dense consolidation RIGHT greater than LEFT lungs compatible with pneumonia and/or pulmonary edema. Small pleural effusions. LEFT renal subcapsular hematoma and small amount of LEFT retroperitoneal hemorrhage partially imaged. These results will be called to the ordering clinician or representative by the Radiologist Assistant, and communication documented in the PACS or zVision Dashboard. Electronically Signed   By: Awilda Metro M.D.   On: 10/23/2015 12:11   Dg Chest Port 1 View  10/23/2015  CLINICAL DATA:  Shortness of breath EXAM: PORTABLE CHEST 1 VIEW COMPARISON:  October 23, 2015 FINDINGS: A right-sided double lumen catheter is identified. The opacity in the right lung has worsened in the interval with increasing density and suggestion of early air bronchograms. The left lung remains clear. No other acute abnormalities. IMPRESSION: Worsening  infiltrate in the right lung. Recommend follow-up to resolution. Electronically Signed   By: Gerome Sam III M.D   On: 10/23/2015 20:34   . amLODipine  10 mg Oral Daily  . antiseptic oral rinse  7 mL Mouth Rinse q12n4p  . atorvastatin  20 mg Oral q1800  . carvedilol  25 mg Oral BID WC  . ceFEPime (MAXIPIME) IV  2 g Intravenous Once  . chlorhexidine  15 mL Mouth Rinse BID  . cloNIDine  0.1 mg Oral BID  . famotidine  20 mg Oral QHS  . furosemide  120 mg Intravenous Q8H  . heparin subcutaneous  5,000 Units Subcutaneous Q8H  . hydrALAZINE  100 mg Oral Q8H  . levalbuterol  1.25 mg Nebulization TID  . methylPREDNISolone (SOLU-MEDROL) injection  60 mg Intravenous Q8H  . minoxidil  7.5 mg Oral BID  . nitroGLYCERIN  0.2 mg Transdermal Once  . pantoprazole  40 mg Oral Q0600  . sevelamer carbonate  1,600 mg Oral TID WC  . sodium chloride flush  10-40 mL Intracatheter  Q12H  . spironolactone  25 mg Oral Daily  . verapamil  240 mg Oral Daily    BMET    Component Value Date/Time   NA 132* 10/24/2015 0220   K 4.8 10/24/2015 0220   CL 97* 10/24/2015 0220   CO2 23 10/24/2015 0220   GLUCOSE 121* 10/24/2015 0220   BUN 70* 10/24/2015 0220   CREATININE 8.46* 10/24/2015 0220   CALCIUM 8.8* 10/24/2015 0220   GFRNONAA 8* 10/24/2015 0220   GFRAA 9* 10/24/2015 0220   CBC    Component Value Date/Time   WBC 14.1* 10/23/2015 0149   RBC 2.60* 10/23/2015 0149   HGB 7.3* 10/23/2015 0149   HCT 22.0* 10/23/2015 0149   PLT 323 10/23/2015 0149   MCV 84.6 10/23/2015 0149   MCH 28.1 10/23/2015 0149   MCHC 33.2 10/23/2015 0149   RDW 13.6 10/23/2015 0149   LYMPHSABS 1.8 10/22/2015 0353   MONOABS 0.7 10/22/2015 0353   EOSABS 0.0 10/22/2015 0353   BASOSABS 0.0 10/22/2015 0353     Assessment/Plan:  1. AKI due to biopsy-proven TMA due to malignant HTN and ATN with preserved interstitium. Last HD session was on 10/16/15  1. Negative GN work up renal biopsy revealed TMA with ATN 2. Possibility  of recovery however worsening prerenal azotemia and volume overload.  3. Started on HD 10/22/15 due to persistence of nausea and anorexia but then developed pulmonary edema/infiltrate 4. Continue to follow closely for recovery 5. BUN rising out of proportion of Scr and did not respont to increased IVF's, decreased lasix, HD as above 2. Pulmonary:  New infiltrate R>L worrisome for pneumonia now with worsening SOB and wheezing- agree with steroids and nebs. 1. Discussed with cardiology and Int Medicine, agree with transfer to ICU 2. S/p urgent HD 10/23/15 for UF 3. Plan for HD with UF again today. 4. Recommend PCCM consult as he may require bronch to help identify source of infiltrate 5. Will cont to follow closely. 3. Anemia- hgb dropped from 12 to 8 now 7.3, concerning for pulmonary hemorrhage.  PCCM to evaluate. 4. Malignant HTN- still not at goal. Recent increase of minoxidil and resumption of hydralazine. Consider clonidine 0.1mg  bid 5. Hyperphosphatemia 6. Chest tightness with HD- CXR to evaluate HD catheter placement was unremarkable. ECHO was without pericardial effusion. Continue to workup per primary svc.  [name] Kaelen Brennan

## 2015-10-24 NOTE — Telephone Encounter (Signed)
Closed encounter °

## 2015-10-24 NOTE — Consult Note (Signed)
Name: Daschel Roughton MRN: [id number] DOB: 03-10-1991    ADMISSION DATE:  10/08/2015 CONSULTATION DATE:  10/24/15  REFERRING MD :  Susie Cassette  CHIEF COMPLAINT:  SOB   HISTORY OF PRESENT ILLNESS:  Izaia Say is a 25 y.o. 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  He was admitted 07/03 with 1wk hx of fever, cough, sore throat, weakness.  He had rapid strep + and was started on amoxicillin.  He also had hypertensive urgency, outpatient meds resumed (doses adjusted by cards) and clonidine added.  Echo revealed severe LVH likely due to long standing HTN.   Over the course of his hospitalization, he has had worsening SOB and increase in weight from 240lbs on admit (07/03) to 261lbs on 07/19.  On 7/18, he had acute worsening of SOB along with increase in O2 needs.  He had CT of the chest that revealed dense consolidation R > L along with small pleural effusions bilaterally.  Findings most consistent with combo of pneumonia and pulmonary edema.  Pt reports he subjectively feels better with BiPAP and after dialysis, he is scheduled for additional HD today.  PCCM was called 7/19 to assist with management.   Anti-infectives    Start     Dose/Rate Route Frequency Ordered Stop   10/23/15 1245  vancomycin (VANCOCIN) 1,500 mg in sodium chloride 0.9 % 500 mL IVPB     1,500 mg 250 mL/hr over 120 Minutes Intravenous  Once 10/23/15 1241 10/23/15 2159   10/23/15 1245  ceFEPIme (MAXIPIME) 2 g in dextrose 5 % 50 mL IVPB     2 g 100 mL/hr over 30 Minutes Intravenous  Once 10/23/15 1241     10/15/15 1115  ceFAZolin (ANCEF) IVPB 2g/100 mL premix     2 g 200 mL/hr over 30 Minutes Intravenous To Radiology 10/15/15 1106 10/16/15 1115   10/15/15 1000  ceFAZolin (ANCEF) IVPB 2g/100 mL premix     2 g 200 mL/hr over 30 Minutes  Intravenous  Once 10/15/15 0952 10/15/15 1023   10/15/15 0949  ceFAZolin (ANCEF) 2-4 GM/100ML-% IVPB    Comments:  Duininck, Stacey   : cabinet override      10/15/15 0949 10/15/15 2159   10/08/15 2000  amoxicillin (AMOXIL) capsule 500 mg  Status:  Discontinued     500 mg Oral Every 12 hours 10/08/15 1339 10/14/15 1609   10/08/15 1015  cephALEXin (KEFLEX) capsule 500 mg     500 mg Oral  Once 10/08/15 1008 10/08/15 1053       PAST MEDICAL HISTORY :   has a past medical history of Hypertension.  has past surgical history that includes Pilonidal cyst / sinus excision (2008). Prior to Admission medications   Medication Sig Start Date End Date Taking? Authorizing Provider  amLODipine (NORVASC) 10 MG tablet Take 10 mg by mouth daily.   Yes Historical Provider, MD  aspirin EC 325 MG tablet Take 325 mg by mouth daily as needed (for headaches).   Yes Historical Provider, MD  cloNIDine (CATAPRES) 0.1 MG tablet Take 0.1 mg by mouth daily.   Yes Historical Provider, MD  furosemide (LASIX) 20 MG tablet Take 20 mg by mouth 2 (two) times daily.   Yes Historical Provider, MD  metoprolol tartrate (LOPRESSOR) 25 MG tablet Take 25 mg by mouth 2 (two) times daily.  Yes Historical Provider, MD   No Known Allergies  FAMILY HISTORY:  family history includes CAD in his father; Diabetes in his mother and sister; Hypertension in his brother and mother. SOCIAL HISTORY:  reports that he has never smoked. He has never used smokeless tobacco. He reports that he does not drink alcohol or use illicit drugs.  REVIEW OF SYSTEMS:   All negative; except for those that are bolded, which indicate positives.  Constitutional: weight loss, weight gain, night sweats, fevers, chills, fatigue, weakness.  HEENT: headaches, sore throat, sneezing, nasal congestion, post nasal drip, difficulty swallowing, tooth/dental problems, visual complaints, visual changes, ear aches. Neuro: difficulty with speech, weakness, numbness,  ataxia. CV:  chest pain, orthopnea, PND, swelling in lower extremities, dizziness, palpitations, syncope.  Resp: cough, hemoptysis, dyspnea, wheezing. GI  heartburn, indigestion, abdominal pain, nausea, vomiting, diarrhea, constipation, change in bowel habits, loss of appetite, hematemesis, melena, hematochezia.  GU: dysuria, change in color of urine, urgency or frequency, flank pain, hematuria. MSK: joint pain or swelling, decreased range of motion. Psych: change in mood or affect, depression, anxiety, suicidal ideations, homicidal ideations. Skin: rash, itching, bruising.    SUBJECTIVE:  States that breathing is improved with BiPAP and also felt better after HD yesterday.  Is scheduled for additional HD today.  VITAL SIGNS: Temp:  [98.9 F (37.2 C)-100.8 F (38.2 C)] 98.9 F (37.2 C) (07/19 1119) Pulse Rate:  [91-110] 101 (07/19 1202) Resp:  [16-47] 27 (07/19 1202) BP: (115-182)/(55-103) 168/101 mmHg (07/19 1202) SpO2:  [90 %-100 %] 96 % (07/19 1202) FiO2 (%):  [60 %-80 %] 60 % (07/19 1202) Weight:  [261 lb 6.4 oz (118.57 kg)-268 lb 4.8 oz (121.7 kg)] 261 lb 6.4 oz (118.57 kg) (07/19 0508)  PHYSICAL EXAMINATION: General: Young AA male, resting in bed, in NAD. Neuro: A&O x 3, non-focal.  HEENT: [place]/AT. PERRL, sclerae anicteric.  BiPAP in place. Cardiovascular: RRR, no M/R/G.  Lungs: Respirations even and unlabored.  Coarse rhonchi on right along with bibasilar crackles. Abdomen: BS x 4, soft, NT/ND.  Musculoskeletal: No gross deformities, 1+ edema.  Skin: Intact, warm, no rashes.     Recent Labs Lab 10/22/15 0353 10/23/15 0149 10/24/15 0220  NA 129* 132* 132*  K 3.2* 3.8 4.8  CL 92* 97* 97*  CO2 21* 21* 23  BUN 128* 89* 70*  CREATININE 13.35* 10.58* 8.46*  GLUCOSE 102* 93 121*    Recent Labs Lab 10/21/15 0348 10/22/15 0353 10/23/15 0149  HGB 8.8* 7.8* 7.3*  HCT 25.2* 22.7* 22.0*  WBC 16.3* 13.6* 14.1*  PLT 422* 357 323   Dg Chest 2 View  10/23/2015   CLINICAL DATA:  Patient with persistent shortness of breath since dialysis. Mid sternal chest pain. EXAM: CHEST  2 VIEW COMPARISON:  10/22/2015 chest radiograph FINDINGS: Central venous catheter tip projects over the superior vena cava. Monitoring leads overlie the patient. Stable cardiomegaly. Significant interval worsening of diffuse consolidation throughout the right lung. Left lung is clear. Small right pleural effusion. Regional skeleton is unremarkable. IMPRESSION: Significant worsening diffuse airspace opacification of the right hemi thorax concerning for pneumonia or potentially asymmetric edema. Continued radiographic followup to ensure resolution is recommended. Small right pleural effusion. Electronically Signed   By: Annia Belt M.D.   On: 10/23/2015 09:40   Dg Chest 2 View  10/22/2015  CLINICAL DATA:  Fever, cough. EXAM: CHEST  2 VIEW COMPARISON:  None. FINDINGS: The heart size and mediastinal contours are within normal limits. Left lung is clear. No  pneumothorax or significant pleural effusion is noted. Right internal jugular dialysis catheter is noted with distal tip in expected position of the SVC. Right perihilar and basilar opacity is noted concerning for edema or possibly pneumonia. The visualized skeletal structures are unremarkable. IMPRESSION: Right perihilar and basilar opacity concerning for edema or pneumonia. Follow-up radiographs are recommended. Electronically Signed   By: Lupita Raider, M.D.   On: 10/22/2015 15:37   Ct Chest Wo Contrast  10/23/2015  CLINICAL DATA:  Extreme shortness of breath. History of hypertension, chronic kidney disease. EXAM: CT CHEST WITHOUT CONTRAST TECHNIQUE: Multidetector CT imaging of the chest was performed following the standard protocol without IV contrast. COMPARISON:  Chest radiograph October 23, 2015 at 0840 hours FINDINGS: Cardiovascular: Heart size is normal. Trace pericardial effusion. Thoracic aorta is normal course and caliber.  Mediastinum/Nodes: Limited assessment for lymphadenopathy by noncontrast examination. Minimal residual thymic tissue. Tunneled dialysis catheter via RIGHT internal jugular venous approach with distal tip at cavoatrial junction. Lungs/Pleura: Dense consolidation and ground-glass opacities predominately in RIGHT lung, to a lesser extent LEFT lower lobe with small bilateral pleural effusions. RIGHT lower lobe with air bronchograms. Tracheobronchial tree is patent and midline. Upper Abdomen: Dense LEFT subcapsular renal hematoma measuring up to 3 cm in greatest depth, with small amount of perinephric dense fat stranding. Musculoskeletal: Normal. IMPRESSION: Dense consolidation RIGHT greater than LEFT lungs compatible with pneumonia and/or pulmonary edema. Small pleural effusions. LEFT renal subcapsular hematoma and small amount of LEFT retroperitoneal hemorrhage partially imaged. These results will be called to the ordering clinician or representative by the Radiologist Assistant, and communication documented in the PACS or zVision Dashboard. Electronically Signed   By: Awilda Metro M.D.   On: 10/23/2015 12:11   Dg Chest Port 1 View  10/23/2015  CLINICAL DATA:  Shortness of breath EXAM: PORTABLE CHEST 1 VIEW COMPARISON:  October 23, 2015 FINDINGS: A right-sided double lumen catheter is identified. The opacity in the right lung has worsened in the interval with increasing density and suggestion of early air bronchograms. The left lung remains clear. No other acute abnormalities. IMPRESSION: Worsening infiltrate in the right lung. Recommend follow-up to resolution. Electronically Signed   By: Gerome Sam III M.D   On: 10/23/2015 20:34    STUDIES:  CT chest 7/18 > dense consolidation R > L with air bronchograms, b/l small pleural effusions.  SIGNIFICANT EVENTS  07/03 > admit 07/18 > increase in SOB and hypoxia, started on BiPAP.  MICRO: Blood 7/18 > Sputum 7/19 >  ANTIBIOTICS: Vanc 7/18 > Cefepime  7/18 >  ASSESSMENT / PLAN:  Acute hypoxic respiratory failure - likely multifactorial in the setting of HCAP + volume overload / pulm edema. HCAP. Pulmonary edema. Small bilateral pleural effusions. Concern for pulmonary hemorrhage as complication following right IJ HD cath placement 7/10 > highly doubt so given no hx hemoptysis at all.  CT most c/w PNA and vol overload.  Suspect Hgb drop likely due to renal hematoma.  Plan: Continue BiPAP as needed. Continue supplemental O2 as needed to maintain SpO2 > 92%. Continue empiric abx, steroids, BD's. Continue diuresis, would increase dosing as able. Agree with extra HD session. Follow CXR. No role bronch at this point.  Anemia - presumed due to renal hematoma. Plan: Continue to monitor H/H. Transfuse for Hgb < 7.  Attending Note:  25 year old male with acute on chronic renal failure who is in pulmonary edema and acute respiratory failure with diffuse pulmonary infiltrate on CT that I  reviewed myself.  Patient's respiratory status is slowly improving with dialysis but is still requiring BiPAP and 60% FiO2.  DDx of pulmonary edema vs HCAP (air bronchograms on CT) vs pulmonary hemorrhage post line placement.  On abx x1 day.  Will sent blood culture and add vancomycin.  Procalcitonin protocol as ordered.  BiPAP for respiratory support.  Continue volume negative as able.  Anticipate will improve with dialysis.  Hold in the SDU for now.  To bronch will require intubation right now, even if pulmonary hemorrhage treatment will be supportive so risk well outweighs the benefit for bronchoscopy.  No indication for bronchoscopy.  The patient is critically ill with multiple organ systems failure and requires high complexity decision making for assessment and support, frequent evaluation and titration of therapies, application of advanced monitoring technologies and extensive interpretation of multiple databases.   Critical Care Time devoted to patient  care services described in this note is  35  Minutes. This time reflects time of care of this signee Dr Koren BoundWesam Yacoub. This critical care time does not reflect procedure time, or teaching time or supervisory time of PA/NP/Med student/Med Resident etc but could involve care discussion time.  Alyson ReedyWesam G. Yacoub, M.D. [facility]. Pager: [phone]. After hours pager: [phone].

## 2015-10-24 NOTE — Progress Notes (Signed)
Pt back to 2H from dialysis and transported on 55% venti mask. Pt states he feels better on the mask and does not want to go back on Bipap at this time. SATs 89% at this time. RT will continue to monitor.

## 2015-10-25 ENCOUNTER — Inpatient Hospital Stay: Payer: Self-pay | Admitting: Family Medicine

## 2015-10-25 ENCOUNTER — Inpatient Hospital Stay (HOSPITAL_COMMUNITY): Payer: Medicaid Other

## 2015-10-25 DIAGNOSIS — R0902 Hypoxemia: Secondary | ICD-10-CM

## 2015-10-25 DIAGNOSIS — R0600 Dyspnea, unspecified: Secondary | ICD-10-CM

## 2015-10-25 DIAGNOSIS — N185 Chronic kidney disease, stage 5: Secondary | ICD-10-CM

## 2015-10-25 DIAGNOSIS — J810 Acute pulmonary edema: Secondary | ICD-10-CM

## 2015-10-25 LAB — URINALYSIS, ROUTINE W REFLEX MICROSCOPIC
Glucose, UA: NEGATIVE mg/dL
Hgb urine dipstick: NEGATIVE
Ketones, ur: 15 mg/dL — AB
NITRITE: NEGATIVE
PH: 5 (ref 5.0–8.0)
Protein, ur: 100 mg/dL — AB
SPECIFIC GRAVITY, URINE: 1.025 (ref 1.005–1.030)

## 2015-10-25 LAB — COMPREHENSIVE METABOLIC PANEL
ALBUMIN: 2.8 g/dL — AB (ref 3.5–5.0)
ALT: 12 U/L — AB (ref 17–63)
AST: 16 U/L (ref 15–41)
Alkaline Phosphatase: 33 U/L — ABNORMAL LOW (ref 38–126)
Anion gap: 11 (ref 5–15)
BILIRUBIN TOTAL: 0.5 mg/dL (ref 0.3–1.2)
BUN: 68 mg/dL — AB (ref 6–20)
CHLORIDE: 96 mmol/L — AB (ref 101–111)
CO2: 25 mmol/L (ref 22–32)
CREATININE: 7.66 mg/dL — AB (ref 0.61–1.24)
Calcium: 9.1 mg/dL (ref 8.9–10.3)
GFR calc Af Amer: 10 mL/min — ABNORMAL LOW (ref >60)
GFR calc non Af Amer: 9 mL/min — ABNORMAL LOW (ref >60)
GLUCOSE: 135 mg/dL — AB (ref 65–99)
POTASSIUM: 4.4 mmol/L (ref 3.5–5.1)
Sodium: 132 mmol/L — ABNORMAL LOW (ref 135–145)
Total Protein: 7.2 g/dL (ref 6.5–8.1)

## 2015-10-25 LAB — URINE MICROSCOPIC-ADD ON: RBC / HPF: NONE SEEN RBC/hpf (ref 0–5)

## 2015-10-25 LAB — CBC
HEMATOCRIT: 21.7 % — AB (ref 39.0–52.0)
Hemoglobin: 7.1 g/dL — ABNORMAL LOW (ref 13.0–17.0)
MCH: 28.1 pg (ref 26.0–34.0)
MCHC: 32.7 g/dL (ref 30.0–36.0)
MCV: 85.8 fL (ref 78.0–100.0)
PLATELETS: 454 10*3/uL — AB (ref 150–400)
RBC: 2.53 MIL/uL — AB (ref 4.22–5.81)
RDW: 13.5 % (ref 11.5–15.5)
WBC: 21 10*3/uL — AB (ref 4.0–10.5)

## 2015-10-25 LAB — PHOSPHORUS: Phosphorus: 6.5 mg/dL — ABNORMAL HIGH (ref 2.5–4.6)

## 2015-10-25 LAB — MRSA PCR SCREENING: MRSA by PCR: NEGATIVE

## 2015-10-25 LAB — PROCALCITONIN: PROCALCITONIN: 3.36 ng/mL

## 2015-10-25 MED ORDER — VANCOMYCIN HCL IN DEXTROSE 1-5 GM/200ML-% IV SOLN
1000.0000 mg | INTRAVENOUS | Status: DC
  Filled 2015-10-25: qty 200

## 2015-10-25 MED ORDER — PREDNISONE 50 MG PO TABS
60.0000 mg | ORAL_TABLET | Freq: Three times a day (TID) | ORAL | Status: DC
Start: 2015-10-25 — End: 2015-10-26
  Administered 2015-10-26 (×2): 60 mg via ORAL
  Filled 2015-10-25 (×2): qty 1

## 2015-10-25 NOTE — Consult Note (Signed)
[facility]  Patient name: Joseph Villegas MRN: [id number] DOB: 25-Oct-1990 Sex: male  REASON FOR CONSULT: permanent dialysis access; consult is from Dr. Arrie Aranoladonato.   HPI: Joseph Villegas is a 25 y.o. male, who presents for evaluation of permanent dialysis access. The patient is left handed. He has never had access procedures before. The patient had a right IJ tunneled dialysis catheter placed on 10/15/15 and dialysis was initiated for the first time on 10/16/15.   [facility]15 with one week history of intermittent fever, generalized fatigue and sore throat. He was found to have a positive rapid strep test and placed on amoxicillin. His ED evaluation was also significant for hypertension (204/152), leukocytosis, creatinine of 7.17 and proteinuria. The patient states that he has had hypertension for 5 years. He was not aware of renal issues. [facility].   His hospital workup revealed severe LVH likely secondary to long standing hypertension. During his hospitalization, he developed acute shortness of breath with increased oxygen requirements requiring BiPAP. His chest CT revealed pneumonia and pulmonary edema. He is currently being treated for HCAP with cefepime and vancomycin. His respiratory status has improved with dialysis. The patient also had chest pain during his admission and cardiology was consulted on 10/18/15. His EKG was significant for LVH. His troponins were elevated but not significant in the setting of renal failure. His EF is normal by ECHO. He remains on a nitro gtt for hypertension.   On ROS, he denies any chest pain or shortness of breath. He is inquiring when he can go home.   Past Medical History  Diagnosis Date  . Hypertension     Family History  Problem Relation Age of Onset  . Hypertension Mother   . Diabetes Mother   . Hypertension Brother   . Diabetes  Sister   . CAD Father     SOCIAL HISTORY: Social History   Social History  . Marital Status: Single    Spouse Name: N/A  . Number of Children: N/A  . Years of Education: N/A   Occupational History  . Not on file.   Social History Main Topics  . Smoking status: Never Smoker   . Smokeless tobacco: Never Used  . Alcohol Use: No  . Drug Use: No  . Sexual Activity: Not Currently   Other Topics Concern  . Not on file   Social History Narrative  . No narrative on file    No Known Allergies  Current Facility-Administered Medications  Medication Dose Route Frequency Provider Last Rate Last Dose  . 0.9 %  sodium chloride infusion   Intravenous Continuous Terrial RhodesJoseph Coladonato, MD 100 mL/hr at 10/23/15 1000    . 0.9 %  sodium chloride infusion  100 mL Intravenous PRN Terrial RhodesJoseph Coladonato, MD      . 0.9 %  sodium chloride infusion  100 mL Intravenous PRN Terrial RhodesJoseph Coladonato, MD      . acetaminophen (TYLENOL) tablet 650 mg  650 mg Oral Q6H PRN Gwenyth BenderKaren M Black, NP   650 mg at 10/23/15 2217   Or  . acetaminophen (TYLENOL) suppository 650 mg  650 mg Rectal Q6H PRN Gwenyth BenderKaren M Black, NP      . amLODipine (NORVASC) tablet 10 mg  10 mg Oral Daily Gwenyth BenderKaren M Black, NP   10 mg at 10/25/15 0915  . antiseptic oral rinse (CPC / CETYLPYRIDINIUM CHLORIDE 0.05%) solution 7 mL  7 mL Mouth Rinse BID Richarda Overlie, MD   7 mL at 10/25/15 0915  . atorvastatin (LIPITOR) tablet 20 mg  20 mg Oral q1800 Richarda Overlie, MD   20 mg at 10/24/15 1736  . carvedilol (COREG) tablet 25 mg  25 mg Oral BID WC Beryle Lathe, MD   25 mg at 10/25/15 0758  . ceFEPIme (MAXIPIME) 1 g in dextrose 5 % 50 mL IVPB  1 g Intravenous Q24H Richarda Overlie, MD   1 g at 10/24/15 1804  . cloNIDine (CATAPRES) tablet 0.1 mg  0.1 mg Oral BID Inez Catalina, MD   0.1 mg at 10/25/15 0915  . famotidine (PEPCID) tablet 20 mg  20 mg Oral QHS Inez Catalina, MD   20 mg at 10/24/15 2125  . furosemide (LASIX) 120 mg in dextrose 5 % 50 mL IVPB  120 mg Intravenous  Q8H Richarda Overlie, MD   120 mg at 10/25/15 1305  . gi cocktail (Maalox,Lidocaine,Donnatal)  30 mL Oral Q6H PRN Dorothea Ogle, MD   30 mL at 10/22/15 2241  . heparin injection 2,300 Units  20 Units/kg Dialysis PRN Terrial Rhodes, MD      . heparin injection 5,000 Units  5,000 Units Subcutaneous Q8H Dorothea Ogle, MD   5,000 Units at 10/25/15 1305  . hydrALAZINE (APRESOLINE) injection 20 mg  20 mg Intravenous Q6H PRN Michael Litter, MD   20 mg at 10/18/15 1456  . hydrALAZINE (APRESOLINE) tablet 100 mg  100 mg Oral Q8H Arita Miss, MD   100 mg at 10/25/15 1306  . HYDROcodone-acetaminophen (NORCO/VICODIN) 5-325 MG per tablet 1-2 tablet  1-2 tablet Oral Q4H PRN Gwenyth Bender, NP   2 tablet at 10/22/15 2049  . levalbuterol (XOPENEX) nebulizer solution 1.25 mg  1.25 mg Nebulization Q3H PRN Rahul P Desai, PA-C      . methylPREDNISolone sodium succinate (SOLU-MEDROL) 125 mg/2 mL injection 60 mg  60 mg Intravenous Q8H Richarda Overlie, MD   60 mg at 10/25/15 1305  . metoprolol (LOPRESSOR) injection 5 mg  5 mg Intravenous Q6H PRN Dorothea Ogle, MD   5 mg at 10/19/15 0230  . minoxidil (LONITEN) tablet 7.5 mg  7.5 mg Oral BID Arita Miss, MD   7.5 mg at 10/25/15 0915  . morphine 2 MG/ML injection 1-2 mg  1-2 mg Intravenous Q4H PRN Dorothea Ogle, MD   2 mg at 10/23/15 0740  . nitroGLYCERIN (NITRODUR - Dosed in mg/24 hr) patch 0.2 mg  0.2 mg Transdermal Once Richarda Overlie, MD   0.2 mg at 10/23/15 1245  . nitroGLYCERIN 50 mg in dextrose 5 % 250 mL (0.2 mg/mL) infusion  0-200 mcg/min Intravenous Titrated Richarda Overlie, MD      . ondansetron (ZOFRAN) tablet 4 mg  4 mg Oral Q6H PRN Gwenyth Bender, NP   4 mg at 10/19/15 2239   Or  . ondansetron (ZOFRAN) injection 4 mg  4 mg Intravenous Q6H PRN Gwenyth Bender, NP   4 mg at 10/22/15 1739  . pantoprazole (PROTONIX) EC tablet 40 mg  40 mg Oral Q0600 Kathlen Mody, MD   40 mg at 10/25/15 1610  . polyethylene glycol (MIRALAX / GLYCOLAX) packet 17 g  17 g Oral Daily PRN Gwenyth Bender, NP      . sevelamer carbonate (RENVELA) tablet 1,600 mg  1,600 mg Oral TID WC Arita Miss, MD   1,600 mg at 10/25/15 1300  . sodium chloride  flush (NS) 0.9 % injection 10-40 mL  10-40 mL Intracatheter Q12H Richarda Overlie, MD   10 mL at 10/25/15 0918  . sodium chloride flush (NS) 0.9 % injection 10-40 mL  10-40 mL Intracatheter PRN Richarda Overlie, MD      . spironolactone (ALDACTONE) tablet 25 mg  25 mg Oral Daily Lars Masson, MD   25 mg at 10/25/15 0915  . vancomycin (VANCOCIN) IVPB 1000 mg/200 mL premix  1,000 mg Intravenous Q T,Th,Sa-HD Emi Holes, RPH      . verapamil (CALAN-SR) CR tablet 240 mg  240 mg Oral Daily Lars Masson, MD   240 mg at 10/25/15 0915    REVIEW OF SYSTEMS:  [X]  denotes positive finding, [ ]  denotes negative finding Cardiac  Comments:  Chest pain or chest pressure:    Shortness of breath upon exertion:    Short of breath when lying flat:    Irregular heart rhythm:        Vascular    Pain in calf, thigh, or hip brought on by ambulation:    Pain in feet at night that wakes you up from your sleep:     Blood clot in your veins:    Leg swelling:         Pulmonary    Oxygen at home:    Productive cough:     Wheezing:         Neurologic    Sudden weakness in arms or legs:     Sudden numbness in arms or legs:     Sudden onset of difficulty speaking or slurred speech:    Temporary loss of vision in one eye:     Problems with dizziness:         Gastrointestinal    Blood in stool:     Vomited blood:         Genitourinary    Burning when urinating:     Blood in urine:        Psychiatric    Major depression:         Hematologic    Bleeding problems:    Problems with blood clotting too easily:        Skin    Rashes or ulcers:        Constitutional    Fever or chills:      PHYSICAL EXAM: Filed Vitals:   10/25/15 0500 10/25/15 0600 10/25/15 0830 10/25/15 1305  BP:  122/60 165/75 150/88  Pulse: 92 88 102   Temp:   98 F  (36.7 C)   TempSrc:   Oral   Resp: 19 16 15    Height:      Weight: 256 lb 9.9 oz (116.4 kg)     SpO2: 100% 99% 91%     GENERAL: The patient is a well-nourished, obese male, in no acute distress. The vital signs are documented above. CARDIAC: There is a regular rate and rhythm. No carotid bruits.  VASCULAR: 2+ radial, ulnar and brachial pulses bilaterally. 3+ dorsalis pedis pulses bilaterally.  PULMONARY: Non labored respiratory effort. Lungs are clear.  MUSCULOSKELETAL: There are no major deformities or cyanosis. NEUROLOGIC: No focal weakness or paresthesias are detected. SKIN: There are no ulcers or rashes noted. PSYCHIATRIC: The patient has a normal affect.  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 He will  be transitioned to a TTS dialysis schedule. The patient is left handed. Vein mapping has been ordered. He is agreeable to access placement. Await vein map results. Remove IV from right arm. Spoke with Dr. Edilia Bo who says there may be room on tomorrow's schedule as a 4th case for right arm fistula versus graft. Given current leukocytosis, may need to delay graft placement if veins are not adequate. Dr. Edilia Bo to evaluate patient.   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  HCAP: on cefepime and vanco Chest pain: resolved. ECHO revealing normal EF, no regional wall motion abnormalities, troponins not significant in setting of renal failure Leukocytosis: 21k, afebrile. Procalcitonin elevated.  Anemia: Hgb 7.1, transfuse per primary.     Maris Berger, PA-C [facility] [phone]  I have interviewed the patient and examined the patient. I agree with the findings by the PA. Vein map is pending. His white count is 21,000.Unless this  improved significantly he probably will not be ready for surgery Friday.  Cari Caraway, MD [phone]

## 2015-10-25 NOTE — Progress Notes (Signed)
Patient ID: Arlo Butt, male   DOB: Mar 05, 1991, 25 y.o.   MRN: [id number]     Patient Name: Joseph Villegas Date of Encounter: 10/25/2015  Principal Problem:   CKD (chronic kidney disease) Active Problems:   HTN (hypertension)   Acute kidney injury (HCC)   Thrombocytopenia (HCC)   Hematuria   Leukocytosis   Accelerated hypertension   Streptococcal sore throat   Post-streptococcal glomerulonephritis   Essential hypertension   AKI (acute kidney injury) [facility])   Primary Cardiologist: New - Dr. Eden Emms Patient Profile: [name] is a 65M with malignant hypertension admitted with acute on chronic kidney failure and newly initiated HD. Cardiology consulted for chest pain and elevated troponin. His symptoms resolved with in/out foley catheterization and GERD treatment.   SUBJECTIVE: good night sleep with bipap   OBJECTIVE Filed Vitals:   10/25/15 0323 10/25/15 0400 10/25/15 0500 10/25/15 0600  BP: 121/70 127/60  122/60  Pulse: 95 94 92 88  Temp: 98.9 F (37.2 C)     TempSrc: Oral     Resp: Height:      Weight:   256 lb 9.9 oz (116.4 kg)   SpO2: 100% 100% 100% 99%    Intake/Output Summary (Last 24 hours) at 10/25/15 0755 Last data filed at 10/25/15 0600  Gross per 24 hour  Intake   1392 ml  Output   3020 ml  Net  -1628 ml   Filed Weights   10/24/15 1250 10/24/15 1555 10/25/15 0500  Weight: 266 lb 12.1 oz (121 kg) 259 lb 7.7 oz (117.7 kg) 256 lb 9.9 oz (116.4 kg)    PHYSICAL EXAM Affect appropriate Obese black male  HEENT: normal Neck supple with no adenopathy JVP normal no bruits no thyromegaly  Right sublavian dialysis catheter  Lungs clear with no wheezing and good diaphragmatic motion Heart:  S1/S2 no murmur, no rub, gallop or click PMI normal Abdomen: benighn, BS positve, no tenderness, no AAA no bruit.  No HSM or HJR Distal pulses intact with no bruits Plus one  edema Neuro non-focal Skin warm and dry No muscular  weakness   LABS: CBC:  Recent Labs  10/24/15 1303 10/25/15 0323  WBC 18.8* 21.0*  HGB 7.6* 7.1*  HCT 23.1* 21.7*  MCV 85.6 85.8  PLT 448* 454*   Basic Metabolic Panel:  Recent Labs  40/98/11 0220 10/25/15 0439  NA 132* 132*  K 4.8 4.4  CL 97* 96*  CO2 23 25  GLUCOSE 121* 135*  BUN 70* 68*  CREATININE 8.46* 7.66*  CALCIUM 8.8* 9.1  PHOS 6.3* 6.5*   Liver Function Tests:  Recent Labs  10/23/15 0149 10/24/15 0220 10/25/15 0439  AST 18  --  16  ALT 8*  --  12*  ALKPHOS 34*  --  33*  BILITOT 0.8  --  0.5  PROT 6.7  --  7.2  ALBUMIN 3.0* 2.7* 2.8*     Current facility-administered medications:  .  0.9 %  sodium chloride infusion, , Intravenous, Continuous, Terrial Rhodes, MD, Last Rate: 100 mL/hr at 10/23/15 1000 .  0.9 %  sodium chloride infusion, 100 mL, Intravenous, PRN, Terrial Rhodes, MD .  0.9 %  sodium chloride infusion, 100 mL, Intravenous, PRN, Terrial Rhodes, MD .  acetaminophen (TYLENOL) tablet 650 mg, 650 mg, Oral, Q6H PRN, 650 mg at 10/23/15 2217 **OR** acetaminophen (TYLENOL) suppository 650 mg, 650 mg, Rectal, Q6H PRN, Lesle Chris Black, NP .  amLODipine (NORVASC) tablet 10 mg, 10  mg, Oral, Daily, Lesle Chris Black, NP, 10 mg at 10/24/15 9147 .  antiseptic oral rinse (CPC / CETYLPYRIDINIUM CHLORIDE 0.05%) solution 7 mL, 7 mL, Mouth Rinse, BID, Richarda Overlie, MD, 7 mL at 10/24/15 2200 .  atorvastatin (LIPITOR) tablet 20 mg, 20 mg, Oral, q1800, Richarda Overlie, MD, 20 mg at 10/24/15 1736 .  carvedilol (COREG) tablet 25 mg, 25 mg, Oral, BID WC, Beryle Lathe, MD, 25 mg at 10/24/15 1736 .  ceFEPIme (MAXIPIME) 1 g in dextrose 5 % 50 mL IVPB, 1 g, Intravenous, Q24H, Richarda Overlie, MD, 1 g at 10/24/15 1804 .  cloNIDine (CATAPRES) tablet 0.1 mg, 0.1 mg, Oral, BID, Inez Catalina, MD, 0.1 mg at 10/24/15 2125 .  famotidine (PEPCID) tablet 20 mg, 20 mg, Oral, QHS, Inez Catalina, MD, 20 mg at 10/24/15 2125 .  furosemide (LASIX) 120 mg in dextrose 5 % 50 mL  IVPB, 120 mg, Intravenous, Q8H, Richarda Overlie, MD, 120 mg at 10/25/15 0612 .  gi cocktail (Maalox,Lidocaine,Donnatal), 30 mL, Oral, Q6H PRN, Dorothea Ogle, MD, 30 mL at 10/22/15 2241 .  heparin injection 2,300 Units, 20 Units/kg, Dialysis, PRN, Terrial Rhodes, MD .  heparin injection 5,000 Units, 5,000 Units, Subcutaneous, Q8H, Dorothea Ogle, MD, 5,000 Units at 10/25/15 0612 .  hydrALAZINE (APRESOLINE) injection 20 mg, 20 mg, Intravenous, Q6H PRN, Michael Litter, MD, 20 mg at 10/18/15 1456 .  hydrALAZINE (APRESOLINE) tablet 100 mg, 100 mg, Oral, Q8H, Arita Miss, MD, 100 mg at 10/25/15 8295 .  HYDROcodone-acetaminophen (NORCO/VICODIN) 5-325 MG per tablet 1-2 tablet, 1-2 tablet, Oral, Q4H PRN, Gwenyth Bender, NP, 2 tablet at 10/22/15 2049 .  levalbuterol (XOPENEX) nebulizer solution 1.25 mg, 1.25 mg, Nebulization, Q3H PRN, Rahul P Desai, PA-C .  methylPREDNISolone sodium succinate (SOLU-MEDROL) 125 mg/2 mL injection 60 mg, 60 mg, Intravenous, Q8H, Richarda Overlie, MD, 60 mg at 10/25/15 0612 .  metoprolol (LOPRESSOR) injection 5 mg, 5 mg, Intravenous, Q6H PRN, Dorothea Ogle, MD, 5 mg at 10/19/15 0230 .  minoxidil (LONITEN) tablet 7.5 mg, 7.5 mg, Oral, BID, Arita Miss, MD, 7.5 mg at 10/24/15 2125 .  morphine 2 MG/ML injection 1-2 mg, 1-2 mg, Intravenous, Q4H PRN, Dorothea Ogle, MD, 2 mg at 10/23/15 0740 .  nitroGLYCERIN (NITRODUR - Dosed in mg/24 hr) patch 0.2 mg, 0.2 mg, Transdermal, Once, Richarda Overlie, MD, 0.2 mg at 10/23/15 1245 .  nitroGLYCERIN 50 mg in dextrose 5 % 250 mL (0.2 mg/mL) infusion, 0-200 mcg/min, Intravenous, Titrated, Richarda Overlie, MD .  ondansetron (ZOFRAN) tablet 4 mg, 4 mg, Oral, Q6H PRN, 4 mg at 10/19/15 2239 **OR** ondansetron (ZOFRAN) injection 4 mg, 4 mg, Intravenous, Q6H PRN, Gwenyth Bender, NP, 4 mg at 10/22/15 1739 .  pantoprazole (PROTONIX) EC tablet 40 mg, 40 mg, Oral, Q0600, Kathlen Mody, MD, 40 mg at 10/25/15 0611 .  polyethylene glycol (MIRALAX / GLYCOLAX) packet 17 g,  17 g, Oral, Daily PRN, Lesle Chris Black, NP .  sevelamer carbonate (RENVELA) tablet 1,600 mg, 1,600 mg, Oral, TID WC, Arita Miss, MD, 1,600 mg at 10/24/15 1734 .  sodium chloride flush (NS) 0.9 % injection 10-40 mL, 10-40 mL, Intracatheter, Q12H, Richarda Overlie, MD, 20 mL at 10/24/15 2200 .  sodium chloride flush (NS) 0.9 % injection 10-40 mL, 10-40 mL, Intracatheter, PRN, Richarda Overlie, MD .  spironolactone (ALDACTONE) tablet 25 mg, 25 mg, Oral, Daily, Lars Masson, MD, 25 mg at 10/24/15 0902 .  verapamil (CALAN-SR) CR tablet 240 mg,  240 mg, Oral, Daily, Lars MassonKatarina H Nelson, MD, 240 mg at 10/24/15 0902 . sodium chloride 100 mL/hr at 10/23/15 1000  . nitroGLYCERIN     TELE: NSR       ECG:  NSR LVH with strain   Radiology/Studies: Dg Chest 2 View  10/23/2015  CLINICAL DATA:  Patient with persistent shortness of breath since dialysis. Mid sternal chest pain. EXAM: CHEST  2 VIEW COMPARISON:  10/22/2015 chest radiograph FINDINGS: Central venous catheter tip projects over the superior vena cava. Monitoring leads overlie the patient. Stable cardiomegaly. Significant interval worsening of diffuse consolidation throughout the right lung. Left lung is clear. Small right pleural effusion. Regional skeleton is unremarkable. IMPRESSION: Significant worsening diffuse airspace opacification of the right hemi thorax concerning for pneumonia or potentially asymmetric edema. Continued radiographic followup to ensure resolution is recommended. Small right pleural effusion. Electronically Signed   By: Annia Beltrew  Davis M.D.   On: 10/23/2015 09:40   Ct Chest Wo Contrast  10/23/2015  CLINICAL DATA:  Extreme shortness of breath. History of hypertension, chronic kidney disease. EXAM: CT CHEST WITHOUT CONTRAST TECHNIQUE: Multidetector CT imaging of the chest was performed following the standard protocol without IV contrast. COMPARISON:  Chest radiograph October 23, 2015 at 0840 hours FINDINGS: Cardiovascular: Heart size is  normal. Trace pericardial effusion. Thoracic aorta is normal course and caliber. Mediastinum/Nodes: Limited assessment for lymphadenopathy by noncontrast examination. Minimal residual thymic tissue. Tunneled dialysis catheter via RIGHT internal jugular venous approach with distal tip at cavoatrial junction. Lungs/Pleura: Dense consolidation and ground-glass opacities predominately in RIGHT lung, to a lesser extent LEFT lower lobe with small bilateral pleural effusions. RIGHT lower lobe with air bronchograms. Tracheobronchial tree is patent and midline. Upper Abdomen: Dense LEFT subcapsular renal hematoma measuring up to 3 cm in greatest depth, with small amount of perinephric dense fat stranding. Musculoskeletal: Normal. IMPRESSION: Dense consolidation RIGHT greater than LEFT lungs compatible with pneumonia and/or pulmonary edema. Small pleural effusions. LEFT renal subcapsular hematoma and small amount of LEFT retroperitoneal hemorrhage partially imaged. These results will be called to the ordering clinician or representative by the Radiologist Assistant, and communication documented in the PACS or zVision Dashboard. Electronically Signed   By: Awilda Metroourtnay  Bloomer M.D.   On: 10/23/2015 12:11   Dg Chest Port 1 View  10/23/2015  CLINICAL DATA:  Shortness of breath EXAM: PORTABLE CHEST 1 VIEW COMPARISON:  October 23, 2015 FINDINGS: A right-sided double lumen catheter is identified. The opacity in the right lung has worsened in the interval with increasing density and suggestion of early air bronchograms. The left lung remains clear. No other acute abnormalities. IMPRESSION: Worsening infiltrate in the right lung. Recommend follow-up to resolution. Electronically Signed   By: Gerome Samavid  Williams III M.D   On: 10/23/2015 20:34     Current Medications:  . amLODipine  10 mg Oral Daily  . antiseptic oral rinse  7 mL Mouth Rinse BID  . atorvastatin  20 mg Oral q1800  . carvedilol  25 mg Oral BID WC  . ceFEPime (MAXIPIME)  IV  1 g Intravenous Q24H  . cloNIDine  0.1 mg Oral BID  . famotidine  20 mg Oral QHS  . furosemide  120 mg Intravenous Q8H  . heparin subcutaneous  5,000 Units Subcutaneous Q8H  . hydrALAZINE  100 mg Oral Q8H  . methylPREDNISolone (SOLU-MEDROL) injection  60 mg Intravenous Q8H  . minoxidil  7.5 mg Oral BID  . nitroGLYCERIN  0.2 mg Transdermal Once  . pantoprazole  40 mg  Oral Q0600  . sevelamer carbonate  1,600 mg Oral TID WC  . sodium chloride flush  10-40 mL Intracatheter Q12H  . spironolactone  25 mg Oral Daily  . verapamil  240 mg Oral Daily   . sodium chloride 100 mL/hr at 10/23/15 1000  . nitroGLYCERIN      ASSESSMENT AND PLAN: Principal Problem:   CKD (chronic kidney disease) Active Problems:   HTN (hypertension)   Acute kidney injury (HCC)   Thrombocytopenia (HCC)   Hematuria   Leukocytosis   Accelerated hypertension   Streptococcal sore throat   Post-streptococcal glomerulonephritis   Essential hypertension   AKI (acute kidney injury) (HCC)  1. Chest pain: improved continue iv nitro for now more for preload reduction and edema.  Minimal bump in troponin not significant in setting of renal failure EF normal by echo With no RWMA;s      2. Malignant HTN:  Improved with less respiratory distress   3. Acute kidney injury: Nephrology following. Continue UF/dialysis weight down 10 lbs   4. ARDS:  Etiology of acute pneumonitis not entirely clear Asymmetric right lung worse  continue diuresis , steroids and bipap.  F/U CXR this am ordered.  CT suggests infiltrate On maxipime.    Charlton Haws

## 2015-10-25 NOTE — Progress Notes (Signed)
PROGRESS NOTE    Joseph Villegas  ZOX:[id number] DOB: [birth year] DOA: 10/08/2015 No PCP  Brief Narrative:   Joseph Villegas is a 25 y.o. 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 He was admitted 07/03 with 1wk hx of fever, cough, sore throat, weakness. He had rapid strep + and was started on amoxicillin. He also had hypertensive urgency, outpatient meds resumed (doses adjusted by cards) and clonidine added. Echo revealed severe LVH likely due to long standing HTN.  Over the course of his hospitalization, he has had worsening SOB and increase in weight from 240lbs on admit (07/03) to 261lbs on 07/19.  On 7/18, he had acute worsening of SOB along with increase in O2 needs. He had CT of the chest that revealed dense consolidation R > L along with small pleural effusions bilaterally. Findings most consistent with combo of pneumonia and pulmonary edema. 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 - Renal ultrasound suggested CKD from HTN and ATN - Renal biopsy done 7/10 - Negative GN work up renal biopsy revealed TMA with ATN,Possibility of recovery however worsening prerenal azotemia and volume overload.reStarted on HD 10/22/15 due to persistence of nausea and anorexia but then developed pulmonary edema/infiltrate Continue to follow closely for recovery - HD done on 7/11, 7/17, 7/18 - Nephrology following, Now has become oliguric and will need to start CLIP process Worsening shortness of breath and Lasix has been increased up to 120  mg IV every 8 hours Plan for HD again today and get on TTS schedule   Accelerated HTN with urgency - BP today still  significantly high, with systolic ranging from 140s - 190s - On amlodipine, coreg, lasix, Hydralazine, minoxidil, verapamil, clonidine, Lasix On echo he has severe LVH and an intracavitary gradient. He also has a murmur on exam. This is due to his hypertension and LVH, not HCM.    Acute hypoxic respiratory failure - likely multifactorial in the setting of HCAP + volume overload / pulm edema Pro calcitonin 3.36 DDx of pulmonary edema vs HCAP (air bronchograms on CT) vs pulmonary hemorrhage post   right IJ HD cath placement 7/10 > highly doubt so given no hx hemoptysis at all. CT most c/w PNA and vol overload.   patient's respiratory status is slowly improving with dialysis currently requiring 4 L of oxygen at rest. To bronch will require intubation right now, even if pulmonary hemorrhage treatment will be supportive so risk well outweighs the benefit for bronchoscopy. No indication for bronchoscopy per PCCM tx for HCAP with cefepime and vanc    Chest pain:  In the setting of pulmonary edema, currently on a nitro drip. Although his EKG does have changes, they are consistent with LVH with repolarization abnormality. His echo is reassuring and does not reveal any wall motion abnormalities. Troponin is elevated, but stable and he has AoCKD. Given that he is 24 the likelihood of ACS is slim troponins  remained in the range of 0.8-0.95 , now trending down ECG with LVH. BP improved on Rx continue dialysis will arrange outpatient F/u with Dr Duke Salvia consider myovue in 6-8 weeks as per Wendall Stade, MD    Anemia-likely anemia of chronic  disease Patient had a drop in hemoglobin from 12->8.8 on 7/16, now 7.6 Transfuse if hemoglobin drops further  Strep throat - Treated with amoxicillin, completed  Acute urinary retention with spasms - Patient insistent that foley come out 7/15 -  - Continue morphine PRN pain   Hyponatremia - Stable and mild, likely due to renal  failure  Hypokalemia 4.8 today, recheck in the AM  Leukocytosis - Unclear cause, improved initially and now  21.0  Check pro-calcitonin - Started on treatment for healthcare associated pneumonia   DVT prophylaxis: Heparin Code Status: Full Family Communication: None today Disposition Plan: Disposition as per nephrology   Consultants:   Nephrology  IR  Cardiology  Procedures:  1. Renal biopsy by IR 7/10 2. IR placed R IJ TDC 7/10 3. Echocardiogram.  Antimicrobials:   Amoxicillin, completed course   Vancomycin/cefepime on 7/18-   Subjective: Altered BiPAP, now on 4 L of oxygen, extremely anxious to be discharged  Objective: Filed Vitals:   10/25/15 0400 10/25/15 0500 10/25/15 0600 10/25/15 0830  BP: 127/60  122/60 165/75  Pulse: 94 92 88 102  Temp:    98 F (36.7 C)  TempSrc:    Oral  Resp: 19 19 16 15   Height:      Weight:  116.4 kg (256 lb 9.9 oz)    SpO2: 100% 100% 99% 91%    Intake/Output Summary (Last 24 hours) at 10/25/15 1309 Last data filed at 10/25/15 1001  Gross per 24 hour  Intake   1454 ml  Output   3070 ml  Net  -1616 ml   Filed Weights   10/24/15 1250 10/24/15 1555 10/25/15 0500  Weight: 121 kg (266 lb 12.1 oz) 117.7 kg (259 lb 7.7 oz) 116.4 kg (256 lb 9.9 oz)    Examination:  General exam: Appears calm and comfortable  Eyes: EOMI, anicteric sclerae Respiratory system: Respiratory effort normal. No audible wheezing Cardiovascular system: RR, NR. No murmur Gastrointestinal system: Abdomen is nondistended, soft and nontender.  +BS Central nervous system: Alert and oriented. No focal neurological deficits. Skin: No new rashes, lesions or ulcers Psychiatry: Judgement and insight appear normal. Mood & affect appropriate.     Data Reviewed:  CBC:  Recent Labs Lab 10/21/15 0348 10/22/15 0353 10/23/15 0149 10/24/15 1303 10/25/15 0323  WBC 16.3* 13.6* 14.1* 18.8* 21.0*  NEUTROABS  --  11.1*  --   --   --   HGB 8.8* 7.8*  7.3* 7.6* 7.1*  HCT 25.2* 22.7* 22.0* 23.1* 21.7*  MCV 81.3 82.2 84.6 85.6 85.8  PLT 422* 357 323 448* 454*   Basic Metabolic Panel:  Recent Labs Lab 10/21/15 0348 10/22/15 0353 10/23/15 0149 10/24/15 0220 10/25/15 0439  NA 129* 129* 132* 132* 132*  K 3.3* 3.2* 3.8 4.8 4.4  CL 88* 92* 97* 97* 96*  CO2 23 21* 21* 23 25  GLUCOSE 96 102* 93 121* 135*  BUN 131* 128* 89* 70* 68*  CREATININE 13.57* 13.35* 10.58* 8.46* 7.66*  CALCIUM 8.8* 8.6* 8.6* 8.8* 9.1  PHOS 10.1* 8.7* 6.0* 6.3* 6.5*   GFR: Estimated Creatinine Clearance: 18.7 mL/min (by C-G formula based on Cr of 7.66). Liver Function Tests:  Recent Labs Lab 10/21/15 0348 10/22/15 0353 10/23/15 0149 10/24/15 0220 10/25/15 0439  AST  --   --  18  --  16  ALT  --   --  8*  --  12*  ALKPHOS  --   --  34*  --  33*  BILITOT  --   --  0.8  --  0.5  PROT  --   --  6.7  --  7.2  ALBUMIN 3.2* 3.1* 3.0* 2.7* 2.8*   No results for input(s): LIPASE, AMYLASE in the last 168 hours. No results for input(s): AMMONIA in the last 168 hours. Coagulation Profile: No results for input(s): INR, PROTIME in the last 168 hours. Cardiac Enzymes:  Recent Labs Lab 10/18/15 2227 10/19/15 0431 10/19/15 0812 10/23/15 0806 10/23/15 1855  TROPONINI 0.95* 0.91* 0.88* 0.30* 0.24*   BNP (last 3 results) No results for input(s): PROBNP in the last 8760 hours. HbA1C: No results for input(s): HGBA1C in the last 72 hours. CBG: No results for input(s): GLUCAP in the last 168 hours. Lipid Profile: No results for input(s): CHOL, HDL, LDLCALC, TRIG, CHOLHDL, LDLDIRECT in the last 72 hours.    Radiology Studies: Dg Chest 2 View  10/25/2015  CLINICAL DATA:  Dyspnea /sob  For few days EXAM: CHEST  2 VIEW COMPARISON:  10/23/2015 FINDINGS: Right-sided dialysis catheter tip overlies the level of the upper right atrium. Heart size is accentuated by shallow lung inflation and appears mildly enlarged. Significant right pulmonary opacities are again  noted the there has been some improvement in aeration. There is no pneumothorax. There is minimal opacity at the left lung base. IMPRESSION: Bilateral pulmonary infiltrates, slightly improved on the right. Electronically Signed   By: Norva PavlovElizabeth  Brown M.D.   On: 10/25/2015 13:06   Dg Chest Port 1 View  10/23/2015  CLINICAL DATA:  Shortness of breath EXAM: PORTABLE CHEST 1 VIEW COMPARISON:  October 23, 2015 FINDINGS: A right-sided double lumen catheter is identified. The opacity in the right lung has worsened in the interval with increasing density and suggestion of early air bronchograms. The left lung remains clear. No other acute abnormalities. IMPRESSION: Worsening infiltrate in the right lung. Recommend follow-up to resolution. Electronically Signed   By: Gerome Samavid  Williams III M.D   On: 10/23/2015 20:34        Scheduled Meds: . amLODipine  10 mg Oral Daily  . antiseptic oral rinse  7 mL Mouth Rinse BID  . atorvastatin  20 mg Oral q1800  . carvedilol  25 mg Oral BID WC  . ceFEPime (MAXIPIME) IV  1 g Intravenous Q24H  . cloNIDine  0.1 mg Oral BID  . famotidine  20 mg Oral QHS  . furosemide  120 mg Intravenous Q8H  . heparin subcutaneous  5,000 Units Subcutaneous Q8H  . hydrALAZINE  100 mg Oral Q8H  . methylPREDNISolone (SOLU-MEDROL) injection  60 mg Intravenous Q8H  . minoxidil  7.5 mg Oral BID  . nitroGLYCERIN  0.2 mg Transdermal Once  . pantoprazole  40 mg Oral Q0600  . sevelamer carbonate  1,600 mg Oral TID WC  . sodium chloride flush  10-40 mL Intracatheter Q12H  . spironolactone  25 mg Oral Daily  . vancomycin  1,000 mg Intravenous Q T,Th,Sa-HD  . verapamil  240 mg Oral Daily   Continuous Infusions: . sodium chloride 100 mL/hr at 10/23/15 1000  . nitroGLYCERIN       LOS: 17 days    Time spent: 25 minutes    Richarda OverlieABROL,Gurley Climer, MD [facility] Pager [phone]  If 7PM-7AM, please contact night-coverage [email or web address] Password [email or web address] 10/25/2015, 1:09 PM

## 2015-10-25 NOTE — Progress Notes (Signed)
Pt. Refused bipap. Pt. States he does not need it at this time.

## 2015-10-25 NOTE — Progress Notes (Signed)
Name: Joseph Villegas MRN: [id number] DOB: 03/10/1991    ADMISSION DATE:  10/08/2015 CONSULTATION DATE:  10/24/15  REFERRING MD :  Susie CassetteAbrol  CHIEF COMPLAINT:  SOB   HISTORY OF PRESENT ILLNESS:  Joseph Villegas is a 25 y.o. 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  He was admitted 07/03 with 1wk hx of fever, cough, sore throat, weakness.  He had rapid strep + and was started on amoxicillin.  He also had hypertensive urgency, outpatient meds resumed (doses adjusted by cards) and clonidine added.  Echo revealed severe LVH likely due to long standing HTN.   Over the course of his hospitalization, he has had worsening SOB and increase in weight from 240lbs on admit (07/03) to 261lbs on 07/19.  On 7/18, he had acute worsening of SOB along with increase in O2 needs.  He had CT of the chest that revealed dense consolidation R > L along with small pleural effusions bilaterally.  Findings most consistent with combo of pneumonia and pulmonary edema.  Pt reports he subjectively feels better with BiPAP and after dialysis, he is scheduled for additional HD today.  PCCM was called 7/19 to assist with management.  SUBJECTIVE:  No events overnight, off BiPAP since dialysis.  VITAL SIGNS: Temp:  [98 F (36.7 C)-99.9 F (37.7 C)] 98 F (36.7 C) (07/20 0830) Pulse Rate:  [88-113] 102 (07/20 0830) Resp:  [11-31] 15 (07/20 0830) BP: (111-168)/(57-101) 165/75 mmHg (07/20 0830) SpO2:  [89 %-100 %] 91 % (07/20 0830) FiO2 (%):  [55 %-60 %] 60 % (07/20 0600) Weight:  [116.4 kg (256 lb 9.9 oz)-121 kg (266 lb 12.1 oz)] 116.4 kg (256 lb 9.9 oz) (07/20 0500)  PHYSICAL EXAMINATION: General: Young AA male, resting in bed, in NAD, on [place] Neuro: A&O x 3, non-focal.  HEENT: [place]/AT. PERRL, sclerae anicteric.  BiPAP in place. Cardiovascular:  RRR, no M/R/G.  Lungs: Bibasilar crackles. Abdomen: BS x 4, soft, NT/ND.  Musculoskeletal: No gross deformities, 1+ edema.  Skin: Intact, warm, no rashes.  Recent Labs Lab 10/23/15 0149 10/24/15 0220 10/25/15 0439  NA 132* 132* 132*  K 3.8 4.8 4.4  CL 97* 97* 96*  CO2 21* 23 25  BUN 89* 70* 68*  CREATININE 10.58* 8.46* 7.66*  GLUCOSE 93 121* 135*   Recent Labs Lab 10/23/15 0149 10/24/15 1303 10/25/15 0323  HGB 7.3* 7.6* 7.1*  HCT 22.0* 23.1* 21.7*  WBC 14.1* 18.8* 21.0*  PLT 323 448* 454*   Ct Chest Wo Contrast  10/23/2015  CLINICAL DATA:  Extreme shortness of breath. History of hypertension, chronic kidney disease. EXAM: CT CHEST WITHOUT CONTRAST TECHNIQUE: Multidetector CT imaging of the chest was performed following the standard protocol without IV contrast. COMPARISON:  Chest radiograph October 23, 2015 at 0840 hours FINDINGS: Cardiovascular: Heart size is normal. Trace pericardial effusion. Thoracic aorta is normal course and caliber. Mediastinum/Nodes: Limited assessment for lymphadenopathy by noncontrast examination. Minimal residual thymic tissue. Tunneled dialysis catheter via RIGHT internal jugular venous approach with distal tip at cavoatrial junction. Lungs/Pleura: Dense consolidation and ground-glass opacities predominately in RIGHT lung, to a lesser extent LEFT lower lobe with small bilateral pleural effusions. RIGHT lower lobe with air bronchograms. Tracheobronchial tree is patent and midline. Upper Abdomen: Dense LEFT subcapsular renal hematoma measuring up to 3 cm in greatest depth, with small amount of perinephric  dense fat stranding. Musculoskeletal: Normal. IMPRESSION: Dense consolidation RIGHT greater than LEFT lungs compatible with pneumonia and/or pulmonary edema. Small pleural effusions. LEFT renal subcapsular hematoma and small amount of LEFT retroperitoneal hemorrhage partially imaged. These results will be called to the ordering clinician or representative by the  Radiologist Assistant, and communication documented in the PACS or zVision Dashboard. Electronically Signed   By: Awilda Metro M.D.   On: 10/23/2015 12:11   Dg Chest Port 1 View  10/23/2015  CLINICAL DATA:  Shortness of breath EXAM: PORTABLE CHEST 1 VIEW COMPARISON:  October 23, 2015 FINDINGS: A right-sided double lumen catheter is identified. The opacity in the right lung has worsened in the interval with increasing density and suggestion of early air bronchograms. The left lung remains clear. No other acute abnormalities. IMPRESSION: Worsening infiltrate in the right lung. Recommend follow-up to resolution. Electronically Signed   By: Gerome Sam III M.D   On: 10/23/2015 20:34   STUDIES:  CT chest 7/18 > dense consolidation R > L with air bronchograms, b/l small pleural effusions.  SIGNIFICANT EVENTS  07/03 > admit 07/18 > increase in SOB and hypoxia, started on BiPAP.  MICRO: Blood 7/18 > Sputum 7/19 >  ANTIBIOTICS: Vanc 7/18 > Cefepime 7/18 >  I reviewed chest CT myself, infiltrate R>L noted.  ASSESSMENT / PLAN:  Acute hypoxic respiratory failure - likely multifactorial in the setting of HCAP + volume overload / pulm edema. HCAP. Pulmonary edema. Small bilateral pleural effusions. Concern for pulmonary hemorrhage as complication following right IJ HD cath placement 7/10 > highly doubt so given no hx hemoptysis at all.  CT most c/w PNA and vol overload.  Suspect Hgb drop likely due to renal hematoma.  Plan: D/C BiPAP Continue supplemental O2 as needed to maintain SpO2 > 92%. Continue empiric abx, steroids, BD's. Continue volume negative. HD per renal No role bronch at this point.  Anemia - presumed due to renal hematoma. Plan: Continue to monitor H/H. Transfuse for Hgb < 7.  Discussed with PCCM-NP.  PCCM will sign off, please call back if needed.  Alyson Reedy, M.D. [facility] Pulmonary/Critical Care Medicine. Pager: [phone]. After hours pager: [phone].

## 2015-10-25 NOTE — Progress Notes (Signed)
Patient ID: Joseph Villegas, male   DOB: 1991-02-19, 25 y.o.   MRN: [id number] S:feels better and is anxious to get out of the hospital O:BP 122/60 mmHg  Pulse 88  Temp(Src) 98.9 F (37.2 C) (Oral)  Resp 16  Ht 5\' 10"  (1.778 m)  Wt 116.4 kg (256 lb 9.9 oz)  BMI 36.82 kg/m2  SpO2 99%  Intake/Output Summary (Last 24 hours) at 10/25/15 0827 Last data filed at 10/25/15 0600  Gross per 24 hour  Intake   1392 ml  Output   3020 ml  Net  -1628 ml   Intake/Output: I/O last 3 completed shifts: In: 2166 [P.O.:1110; Other:620; IV Piggyback:436] Out: 3270 [Urine:300; Other:2970]  Intake/Output this shift:    Weight change: -0.7 kg (-1 lb 8.7 oz) Gen:WD WN AAM in NAD CVS:no rub Resp:decreased BS/air movement, occ rhonchi WUJ:[id number] Ext:no edema   Recent Labs Lab 10/19/15 0431 10/20/15 0320 10/21/15 0348 10/22/15 0353 10/23/15 0149 10/24/15 0220 10/25/15 0439  NA 127* 132* 129* 129* 132* 132* 132*  K 3.5 3.1* 3.3* 3.2* 3.8 4.8 4.4  CL 81* 83* 88* 92* 97* 97* 96*  CO2 25 25 23  21* 21* 23 25  GLUCOSE 127* 86 96 102* 93 121* 135*  BUN 120* 126* 131* 128* 89* 70* 68*  CREATININE 13.67* 13.72* 13.57* 13.35* 10.58* 8.46* 7.66*  ALBUMIN 3.9 3.3* 3.2* 3.1* 3.0* 2.7* 2.8*  CALCIUM 9.6 9.5 8.8* 8.6* 8.6* 8.8* 9.1  PHOS 11.3* 11.8* 10.1* 8.7* 6.0* 6.3* 6.5*  AST  --   --   --   --  18  --  16  ALT  --   --   --   --  8*  --  12*   Liver Function Tests:  Recent Labs Lab 10/23/15 0149 10/24/15 0220 10/25/15 0439  AST 18  --  16  ALT 8*  --  12*  ALKPHOS 34*  --  33*  BILITOT 0.8  --  0.5  PROT 6.7  --  7.2  ALBUMIN 3.0* 2.7* 2.8*   No results for input(s): LIPASE, AMYLASE in the last 168 hours. No results for input(s): AMMONIA in the last 168 hours. CBC:  Recent Labs Lab 10/21/15 0348 10/22/15 0353 10/23/15 0149 10/24/15 1303 10/25/15 0323  WBC 16.3* 13.6* 14.1* 18.8* 21.0*  NEUTROABS  --  11.1*  --   --   --   HGB 8.8* 7.8* 7.3* 7.6* 7.1*  HCT 25.2* 22.7* 22.0*  23.1* 21.7*  MCV 81.3 82.2 84.6 85.6 85.8  PLT 422* 357 323 448* 454*   Cardiac Enzymes:  Recent Labs Lab 10/18/15 2227 10/19/15 0431 10/19/15 0812 10/23/15 0806 10/23/15 1855  TROPONINI 0.95* 0.91* 0.88* 0.30* 0.24*   CBG: No results for input(s): GLUCAP in the last 168 hours.  Iron Studies: No results for input(s): IRON, TIBC, TRANSFERRIN, FERRITIN in the last 72 hours. Studies/Results: Dg Chest 2 View  10/23/2015  CLINICAL DATA:  Patient with persistent shortness of breath since dialysis. Mid sternal chest pain. EXAM: CHEST  2 VIEW COMPARISON:  10/22/2015 chest radiograph FINDINGS: Central venous catheter tip projects over the superior vena cava. Monitoring leads overlie the patient. Stable cardiomegaly. Significant interval worsening of diffuse consolidation throughout the right lung. Left lung is clear. Small right pleural effusion. Regional skeleton is unremarkable. IMPRESSION: Significant worsening diffuse airspace opacification of the right hemi thorax concerning for pneumonia or potentially asymmetric edema. Continued radiographic followup to ensure resolution is recommended. Small right pleural effusion. Electronically Signed  By: Annia Beltrew  Davis M.D.   On: 10/23/2015 09:40   Ct Chest Wo Contrast  10/23/2015  CLINICAL DATA:  Extreme shortness of breath. History of hypertension, chronic kidney disease. EXAM: CT CHEST WITHOUT CONTRAST TECHNIQUE: Multidetector CT imaging of the chest was performed following the standard protocol without IV contrast. COMPARISON:  Chest radiograph October 23, 2015 at 0840 hours FINDINGS: Cardiovascular: Heart size is normal. Trace pericardial effusion. Thoracic aorta is normal course and caliber. Mediastinum/Nodes: Limited assessment for lymphadenopathy by noncontrast examination. Minimal residual thymic tissue. Tunneled dialysis catheter via RIGHT internal jugular venous approach with distal tip at cavoatrial junction. Lungs/Pleura: Dense consolidation and  ground-glass opacities predominately in RIGHT lung, to a lesser extent LEFT lower lobe with small bilateral pleural effusions. RIGHT lower lobe with air bronchograms. Tracheobronchial tree is patent and midline. Upper Abdomen: Dense LEFT subcapsular renal hematoma measuring up to 3 cm in greatest depth, with small amount of perinephric dense fat stranding. Musculoskeletal: Normal. IMPRESSION: Dense consolidation RIGHT greater than LEFT lungs compatible with pneumonia and/or pulmonary edema. Small pleural effusions. LEFT renal subcapsular hematoma and small amount of LEFT retroperitoneal hemorrhage partially imaged. These results will be called to the ordering clinician or representative by the Radiologist Assistant, and communication documented in the PACS or zVision Dashboard. Electronically Signed   By: Awilda Metroourtnay  Bloomer M.D.   On: 10/23/2015 12:11   Dg Chest Port 1 View  10/23/2015  CLINICAL DATA:  Shortness of breath EXAM: PORTABLE CHEST 1 VIEW COMPARISON:  October 23, 2015 FINDINGS: A right-sided double lumen catheter is identified. The opacity in the right lung has worsened in the interval with increasing density and suggestion of early air bronchograms. The left lung remains clear. No other acute abnormalities. IMPRESSION: Worsening infiltrate in the right lung. Recommend follow-up to resolution. Electronically Signed   By: Gerome Samavid  Williams III M.D   On: 10/23/2015 20:34   . amLODipine  10 mg Oral Daily  . antiseptic oral rinse  7 mL Mouth Rinse BID  . atorvastatin  20 mg Oral q1800  . carvedilol  25 mg Oral BID WC  . ceFEPime (MAXIPIME) IV  1 g Intravenous Q24H  . cloNIDine  0.1 mg Oral BID  . famotidine  20 mg Oral QHS  . furosemide  120 mg Intravenous Q8H  . heparin subcutaneous  5,000 Units Subcutaneous Q8H  . hydrALAZINE  100 mg Oral Q8H  . methylPREDNISolone (SOLU-MEDROL) injection  60 mg Intravenous Q8H  . minoxidil  7.5 mg Oral BID  . nitroGLYCERIN  0.2 mg Transdermal Once  .  pantoprazole  40 mg Oral Q0600  . sevelamer carbonate  1,600 mg Oral TID WC  . sodium chloride flush  10-40 mL Intracatheter Q12H  . spironolactone  25 mg Oral Daily  . verapamil  240 mg Oral Daily    BMET    Component Value Date/Time   NA 132* 10/25/2015 0439   K 4.4 10/25/2015 0439   CL 96* 10/25/2015 0439   CO2 25 10/25/2015 0439   GLUCOSE 135* 10/25/2015 0439   BUN 68* 10/25/2015 0439   CREATININE 7.66* 10/25/2015 0439   CALCIUM 9.1 10/25/2015 0439   GFRNONAA 9* 10/25/2015 0439   GFRAA 10* 10/25/2015 0439   CBC    Component Value Date/Time   WBC 21.0* 10/25/2015 0323   RBC 2.53* 10/25/2015 0323   HGB 7.1* 10/25/2015 0323   HCT 21.7* 10/25/2015 0323   PLT 454* 10/25/2015 0323   MCV 85.8 10/25/2015 0323   MCH  28.1 10/25/2015 0323   MCHC 32.7 10/25/2015 0323   RDW 13.5 10/25/2015 0323   LYMPHSABS 1.8 10/22/2015 0353   MONOABS 0.7 10/22/2015 0353   EOSABS 0.0 10/22/2015 0353   BASOSABS 0.0 10/22/2015 0353     Assessment/Plan:  1. AKI due to biopsy-proven TMA due to malignant HTN and ATN with preserved interstitium. Last HD session was on 10/16/15  1. Negative GN work up renal biopsy revealed TMA with ATN 2. Possibility of recovery however worsening prerenal azotemia and volume overload.  3. Started on HD 10/22/15 due to persistence of nausea and anorexia but then developed pulmonary edema/infiltrate 4. BUN rising out of proportion of Scr and did not respont to increased IVF's, decreased lasix, HD as above 5. Now has become oliguric and will need to start CLIP process and ask [facility] to evaluate for permanent access as he will likely require ongoing dialysis as an outpatient. 6. Plan for HD again today and get on TTS schedule. 2. Pulmonary: New infiltrate R>L worrisome for pneumonia now with worsening SOB and wheezing- agree with steroids and nebs. 1. Discussed with cardiology and Int Medicine, agree with transfer to ICU 2. S/p urgent HD 10/23/15 for UF and again  10/24/15 with improvement of symptoms. 3. Appreciate PCCM consult who believe source of infiltrate is combo of HCAP and volume 4. Improved with Bipap and UF. 5. Will cont to follow closely. 3. Anemia- hgb dropped from 12 to 8 now 7.3, concerning for pulmonary hemorrhage. PCCM to evaluate. 4. Malignant HTN- still not at goal. Recent increase of minoxidil and resumption of hydralazine. Consider clonidine 0.1mg  bid 5. Hyperphosphatemia 6. Chest tightness with HD- CXR to evaluate HD catheter placement was unremarkable. ECHO was without pericardial effusion. Continue to workup per primary svc. 7. Disposition- will need to have outpatient HD arrangements and permanent access placed prior to discharge.  [name] Wm Fruchter

## 2015-10-26 ENCOUNTER — Encounter (HOSPITAL_COMMUNITY)
Admission: EM | Disposition: A | Discharge status: Home / Self Care | Payer: Self-pay | Source: Home / Self Care | Attending: Internal Medicine

## 2015-10-26 ENCOUNTER — Encounter (HOSPITAL_COMMUNITY): Payer: Self-pay | Admitting: Certified Registered Nurse Anesthetist

## 2015-10-26 ENCOUNTER — Inpatient Hospital Stay (HOSPITAL_COMMUNITY): Payer: Medicaid Other

## 2015-10-26 DIAGNOSIS — N179 Acute kidney failure, unspecified: Secondary | ICD-10-CM

## 2015-10-26 LAB — CBC
HEMATOCRIT: 21.8 % — AB (ref 39.0–52.0)
HEMOGLOBIN: 7.4 g/dL — AB (ref 13.0–17.0)
MCH: 28.8 pg (ref 26.0–34.0)
MCHC: 33.9 g/dL (ref 30.0–36.0)
MCV: 84.8 fL (ref 78.0–100.0)
Platelets: 509 10*3/uL — ABNORMAL HIGH (ref 150–400)
RBC: 2.57 MIL/uL — AB (ref 4.22–5.81)
RDW: 13.5 % (ref 11.5–15.5)
WBC: 19.3 10*3/uL — AB (ref 4.0–10.5)

## 2015-10-26 LAB — COMPREHENSIVE METABOLIC PANEL
ALBUMIN: 3.1 g/dL — AB (ref 3.5–5.0)
ALK PHOS: 36 U/L — AB (ref 38–126)
ALT: 16 U/L — ABNORMAL LOW (ref 17–63)
ANION GAP: 17 — AB (ref 5–15)
AST: 14 U/L — AB (ref 15–41)
BILIRUBIN TOTAL: 0.6 mg/dL (ref 0.3–1.2)
BUN: 113 mg/dL — ABNORMAL HIGH (ref 6–20)
CALCIUM: 9.6 mg/dL (ref 8.9–10.3)
CO2: 22 mmol/L (ref 22–32)
Chloride: 92 mmol/L — ABNORMAL LOW (ref 101–111)
Creatinine, Ser: 10.08 mg/dL — ABNORMAL HIGH (ref 0.61–1.24)
GFR calc non Af Amer: 6 mL/min — ABNORMAL LOW (ref >60)
GFR, EST AFRICAN AMERICAN: 7 mL/min — AB (ref >60)
Glucose, Bld: 130 mg/dL — ABNORMAL HIGH (ref 65–99)
POTASSIUM: 4.4 mmol/L (ref 3.5–5.1)
SODIUM: 131 mmol/L — AB (ref 135–145)
TOTAL PROTEIN: 7.2 g/dL (ref 6.5–8.1)

## 2015-10-26 LAB — PHOSPHORUS: Phosphorus: 8.7 mg/dL — ABNORMAL HIGH (ref 2.5–4.6)

## 2015-10-26 LAB — PROCALCITONIN: PROCALCITONIN: 2.6 ng/mL

## 2015-10-26 SURGERY — ARTERIOVENOUS (AV) FISTULA CREATION
Anesthesia: Monitor Anesthesia Care | Laterality: Right

## 2015-10-26 MED ORDER — VANCOMYCIN HCL IN DEXTROSE 1-5 GM/200ML-% IV SOLN: 1000.0000 mg | Freq: Once | INTRAVENOUS | Status: DC

## 2015-10-26 MED ORDER — PREDNISONE 50 MG PO TABS
50.0000 mg | ORAL_TABLET | Freq: Three times a day (TID) | ORAL | Status: DC
  Administered 2015-10-26 – 2015-10-28 (×5): 50 mg via ORAL
  Filled 2015-10-26 (×5): qty 1

## 2015-10-26 NOTE — Progress Notes (Addendum)
PROGRESS NOTE    Joseph Villegas  ION:[id number] DOB: Apr 09, 1990 DOA: 10/08/2015 No PCP  Brief Narrative:   Joseph Villegas is a 25 y.o. 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 He was admitted 07/03 with 1wk hx of fever, cough, sore throat, weakness. He had rapid strep + and was started on amoxicillin. He also had hypertensive urgency, outpatient meds resumed (doses adjusted by cards) and clonidine added. Echo revealed severe LVH likely due to long standing HTN.  Over the course of his hospitalization, he has had worsening SOB and increase in weight from 240lbs on admit (07/03) to 261lbs on 07/19.  On 7/18, he had acute worsening of SOB along with increase in O2 needs. He had CT of the chest that revealed dense consolidation R > L along with small pleural effusions bilaterally. Findings most consistent with combo of pneumonia and pulmonary edema. Pt reports he subjectively feels better with BiPAP and after dialysis,    Assessment & Plan:   AKI vs. 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 Lasix has been discontinued Plan for HD again today and get on TTS schedule   Accelerated HTN with urgency -- On amlodipine, coreg,  , Hydralazine, minoxidil, verapamil,  clonidine,  Aldactone On echo he has severe LVH and an intracavitary gradient. He also has a murmur on exam. This is due to his hypertension and LVH, not HCM.    Acute hypoxic respiratory failure - likely multifactorial in the setting of HCAP + volume overload / pulm edema Pro calcitonin 3.36 DDx of pulmonary edema vs HCAP (air bronchograms on CT) vs pulmonary hemorrhage post   right IJ HD cath placement 7/10 > highly doubt so given no hx hemoptysis at all. CT most c/w PNA and vol overload.   patient's respiratory status is slowly improving with dialysis currently requiring 3 L of oxygen at rest. To bronch will require intubation right now, even if pulmonary hemorrhage treatment will be supportive so risk well outweighs the benefit for bronchoscopy. No indication for bronchoscopy per PCCM tx for HCAP with cefepime and vanc , will stop after a total of 7 days   Chest pain:  In the setting of pulmonary edema, currently on a nitro drip. Although his EKG does have changes, they are consistent with LVH with repolarization abnormality. His echo is reassuring and does not reveal any wall motion abnormalities. Troponin is elevated, but stable and he has AoCKD.  troponins  remained in the range of 0.8-0.95 , now trending down ECG with LVH. BP improved on Rx continue dialysis will arrange outpatient F/u with Dr Duke Salviaandolph consider myovue in 6-8 weeks as per Wendall StadePeter C Nishan, MD  Cardiology has signed off  Anemia-likely anemia of chronic disease Patient had a drop in hemoglobin from 12->8.8 on 7/16, now 7.4, transfuse  with hemodialysis, will defer  to nephrology Transfuse if hemoglobin drops further  Strep throat - Treated with amoxicillin, completed  Acute urinary retention with spasms - Patient insistent that foley come out 7/15 -  - Continue morphine PRN pain   Hyponatremia - Stable and mild, likely due to renal failure  Hypokalemia 4.8 today, recheck in the AM  Leukocytosis -  Unclear cause, improved initially and now  21.0>19.8   pro-calcitonin 3.36 - Started on treatment for healthcare associated pneumonia   DVT prophylaxis: Heparin Code Status: Full Family Communication: None today Disposition Plan: will need to have outpatient HD arrangements and permanent access placed prior to discharge   Consultants:   Nephrology  IR  Cardiology  Pulmonary  Procedures:  1. Renal biopsy by IR 7/10 2. IR placed R IJ TDC 7/10 3. Echocardiogram.  Antimicrobials:   Amoxicillin, completed course   Vancomycin/cefepime on 7/18-   Subjective: Patient is off BiPAP, now requiring 3 L of oxygen  Objective: Filed Vitals:   10/26/15 1000 10/26/15 1030 10/26/15 1050 10/26/15 1159  BP: 166/68 155/64 140/65 164/76  Pulse: 102 102 101 106  Temp:   98.6 F (37 C) 98 F (36.7 C)  TempSrc:   Oral Oral  Resp:   19 27  Height:      Weight:   114.3 kg (251 lb 15.8 oz)   SpO2:   95% 93%    Intake/Output Summary (Last 24 hours) at 10/26/15 1237 Last data filed at 10/26/15 1050  Gross per 24 hour  Intake    952 ml  Output   3225 ml  Net  -2273 ml   Filed Weights   10/26/15 0549 10/26/15 0714 10/26/15 1050  Weight: 116.62 kg (257 lb 1.6 oz) 117.6 kg (259 lb 4.2 oz) 114.3 kg (251 lb 15.8 oz)    Examination:  General exam: Appears calm and comfortable  Eyes: EOMI, anicteric sclerae Respiratory system: Respiratory effort normal. No audible wheezing Cardiovascular system: RR, NR. No murmur Gastrointestinal system: Abdomen is nondistended, soft and nontender.  +BS Central nervous system: Alert and oriented. No focal neurological deficits. Skin: No new rashes, lesions or ulcers Psychiatry: Judgement and insight appear normal. Mood & affect appropriate.     Data Reviewed:  CBC:  Recent Labs Lab 10/22/15 0353 10/23/15 0149 10/24/15 1303 10/25/15 0323 10/26/15 0730  WBC 13.6* 14.1* 18.8* 21.0* 19.3*  NEUTROABS 11.1*  --   --   --   --   HGB 7.8*  7.3* 7.6* 7.1* 7.4*  HCT 22.7* 22.0* 23.1* 21.7* 21.8*  MCV 82.2 84.6 85.6 85.8 84.8  PLT 357 323 448* 454* 509*   Basic Metabolic Panel:  Recent Labs Lab 10/22/15 0353 10/23/15 0149 10/24/15 0220 10/25/15 0439 10/26/15 0730 10/26/15 0851  NA 129* 132* 132* 132* 131*  --   K 3.2* 3.8 4.8 4.4 4.4  --   CL 92* 97* 97* 96* 92*  --   CO2 21* 21* --   GLUCOSE 102* 93 121* 135* 130*  --   BUN 128* 89* 70* 68* 113*  --   CREATININE 13.35* 10.58* 8.46* 7.66* 10.08*  --   CALCIUM 8.6* 8.6* 8.8* 9.1 9.6  --   PHOS 8.7* 6.0* 6.3* 6.5*  --  8.7*   GFR: Estimated Creatinine Clearance: 14.1 mL/min (by C-G formula based on Cr of 10.08). Liver Function Tests:  Recent Labs Lab 10/22/15 0353 10/23/15 0149 10/24/15 0220 10/25/15 0439 10/26/15 0730  AST  --  18  --  16 14*  ALT  --  8*  --  12* 16*  ALKPHOS  --  34*  --  33* 36*  BILITOT  --  0.8  --  0.5 0.6  PROT  --  6.7  --  7.2 7.2  ALBUMIN 3.1* 3.0* 2.7* 2.8* 3.1*   No results for input(s): LIPASE, AMYLASE in the last 168 hours. No results for input(s): AMMONIA in the last 168 hours. Coagulation Profile: No results for input(s): INR, PROTIME in the last 168 hours. Cardiac Enzymes:  Recent Labs Lab 10/23/15 0806 10/23/15 1855  TROPONINI 0.30* 0.24*   BNP (last 3 results) No results for input(s): PROBNP in the last 8760 hours. HbA1C: No results for input(s): HGBA1C in the last 72 hours. CBG: No results for input(s): GLUCAP in the last 168 hours. Lipid Profile: No results for input(s): CHOL, HDL, LDLCALC, TRIG, CHOLHDL, LDLDIRECT in the last 72 hours.    Radiology Studies: Dg Chest 2 View  10/25/2015  CLINICAL DATA:  Dyspnea /sob  For few days EXAM: CHEST  2 VIEW COMPARISON:  10/23/2015 FINDINGS: Right-sided dialysis catheter tip overlies the level of the upper right atrium. Heart size is accentuated by shallow lung inflation and appears mildly enlarged. Significant right pulmonary opacities are again  noted the there has been some improvement in aeration. There is no pneumothorax. There is minimal opacity at the left lung base. IMPRESSION: Bilateral pulmonary infiltrates, slightly improved on the right. Electronically Signed   By: Norva Pavlov M.D.   On: 10/25/2015 13:06        Scheduled Meds: . amLODipine  10 mg Oral Daily  . antiseptic oral rinse  7 mL Mouth Rinse BID  . atorvastatin  20 mg Oral q1800  . carvedilol  25 mg Oral BID WC  . ceFEPime (MAXIPIME) IV  1 g Intravenous Q24H  . cloNIDine  0.1 mg Oral BID  . famotidine  20 mg Oral QHS  . heparin subcutaneous  5,000 Units Subcutaneous Q8H  . hydrALAZINE  100 mg Oral Q8H  . minoxidil  7.5 mg Oral BID  . nitroGLYCERIN  0.2 mg Transdermal Once  . pantoprazole  40 mg Oral Q0600  . predniSONE  60 mg Oral Q8H  . sevelamer carbonate  1,600 mg Oral TID WC  . sodium chloride flush  10-40 mL Intracatheter Q12H  . spironolactone  25 mg Oral Daily  . vancomycin  1,000 mg Intravenous Once  . verapamil  240 mg Oral Daily   Continuous Infusions: . sodium chloride Stopped (10/25/15 1900)  . nitroGLYCERIN       LOS: 18 days    Time spent: 25 minutes    Richarda Overlie, MD [facility] Pager [phone]  If 7PM-7AM, please contact night-coverage [email or web address] Password [facility] 10/26/2015, 12:37 PM

## 2015-10-26 NOTE — Progress Notes (Addendum)
Pharmacy Antibiotic Note  Joseph Villegas is a 25 y.o. male admitted on 10/08/2015 with pneumonia.  Continues on Vancomycin and Cefepime Cultures negative to date  Afebrile, WBC WNL   Plan: Cefepime 1 gram iv q 24 hours Vancomycin 1000 mg iv X 1 after HD today Further dosing after HD  Height: 5\' 10"  (177.8 cm) Weight: 251 lb 15.8 oz (114.3 kg) IBW/kg (Calculated) : 73  Temp (24hrs), Avg:98.2 F (36.8 C), Min:97.9 F (36.6 C), Max:98.6 F (37 C)   Recent Labs Lab 10/22/15 0353 10/23/15 0149 10/24/15 0220 10/24/15 1303 10/25/15 0323 10/25/15 0439 10/26/15 0730  WBC 13.6* 14.1*  --  18.8* 21.0*  --  19.3*  CREATININE 13.35* 10.58* 8.46*  --   --  7.66* 10.08*    Estimated Creatinine Clearance: 14.1 mL/min (by C-G formula based on Cr of 10.08).    No Known Allergies   Thank you for allowing pharmacy to be a part of this patient's care. Okey RegalLisa Verdell Kincannon, PharmD [phone]  10/26/2015 11:10 AM

## 2015-10-26 NOTE — Progress Notes (Addendum)
Patient ID: Joseph Villegas, male   DOB: 1991/03/14, 25 y.o.   MRN: [id number] S:Feels much better O:BP 163/71 mmHg  Pulse 103  Temp(Src) 98.3 F (36.8 C) (Oral)  Resp 17  Ht  (1.778 m)  Wt 117.6 kg (259 lb 4.2 oz)  BMI 37.20 kg/m2  SpO2 95%  Intake/Output Summary (Last 24 hours) at 10/26/15 0837 Last data filed at 10/26/15 0700  Gross per 24 hour  Intake   1432 ml  Output    275 ml  Net   1157 ml   Intake/Output: I/O last 3 completed shifts: In: 1906 [P.O.:1560; Other:110; IV Piggyback:236] Out: 325 [Urine:325]  Intake/Output this shift:    Weight change: -3.8 kg (-8 lb 6 oz) Gen:WD WN AAM in NAD CVS:no rub Resp: cta NWG:NFAOZH Ext:no edema   Recent Labs Lab 10/20/15 0320 10/21/15 0348 10/22/15 0353 10/23/15 0149 10/24/15 0220 10/25/15 0439 10/26/15 0730  NA 132* 129* 129* 132* 132* 132* 131*  K 3.1* 3.3* 3.2* 3.8 4.8 4.4 4.4  CL 83* 88* 92* 97* 97* 96* 92*  CO2 25 23 21* 21* GLUCOSE 86 96 102* 93 121* 135* 130*  BUN 126* 131* 128* 89* 70* 68* 113*  CREATININE 13.72* 13.57* 13.35* 10.58* 8.46* 7.66* 10.08*  ALBUMIN 3.3* 3.2* 3.1* 3.0* 2.7* 2.8* 3.1*  CALCIUM 9.5 8.8* 8.6* 8.6* 8.8* 9.1 9.6  PHOS 11.8* 10.1* 8.7* 6.0* 6.3* 6.5*  --   AST  --   --   --  18  --  16 14*  ALT  --   --   --  8*  --  12* 16*   Liver Function Tests:  Recent Labs Lab 10/23/15 0149 10/24/15 0220 10/25/15 0439 10/26/15 0730  AST 18  --  16 14*  ALT 8*  --  12* 16*  ALKPHOS 34*  --  33* 36*  BILITOT 0.8  --  0.5 0.6  PROT 6.7  --  7.2 7.2  ALBUMIN 3.0* 2.7* 2.8* 3.1*   No results for input(s): LIPASE, AMYLASE in the last 168 hours. No results for input(s): AMMONIA in the last 168 hours. CBC:  Recent Labs Lab 10/22/15 0353 10/23/15 0149 10/24/15 1303 10/25/15 0323 10/26/15 0730  WBC 13.6* 14.1* 18.8* 21.0* 19.3*  NEUTROABS 11.1*  --   --   --   --   HGB 7.8* 7.3* 7.6* 7.1* 7.4*  HCT 22.7* 22.0* 23.1* 21.7* 21.8*  MCV 82.2 84.6 85.6 85.8 84.8   PLT 357 323 448* 454* 509*   Cardiac Enzymes:  Recent Labs Lab 10/23/15 0806 10/23/15 1855  TROPONINI 0.30* 0.24*   CBG: No results for input(s): GLUCAP in the last 168 hours.  Iron Studies: No results for input(s): IRON, TIBC, TRANSFERRIN, FERRITIN in the last 72 hours. Studies/Results: Dg Chest 2 View  10/25/2015  CLINICAL DATA:  Dyspnea /sob  For few days EXAM: CHEST  2 VIEW COMPARISON:  10/23/2015 FINDINGS: Right-sided dialysis catheter tip overlies the level of the upper right atrium. Heart size is accentuated by shallow lung inflation and appears mildly enlarged. Significant right pulmonary opacities are again noted the there has been some improvement in aeration. There is no pneumothorax. There is minimal opacity at the left lung base. IMPRESSION: Bilateral pulmonary infiltrates, slightly improved on the right. Electronically Signed   By: Norva Pavlov M.D.   On: 10/25/2015 13:06   . amLODipine  10 mg Oral Daily  . antiseptic oral rinse  7 mL Mouth Rinse  BID  . atorvastatin  20 mg Oral q1800  . carvedilol  25 mg Oral BID WC  . ceFEPime (MAXIPIME) IV  1 g Intravenous Q24H  . cloNIDine  0.1 mg Oral BID  . famotidine  20 mg Oral QHS  . heparin subcutaneous  5,000 Units Subcutaneous Q8H  . hydrALAZINE  100 mg Oral Q8H  . minoxidil  7.5 mg Oral BID  . nitroGLYCERIN  0.2 mg Transdermal Once  . pantoprazole  40 mg Oral Q0600  . predniSONE  60 mg Oral Q8H  . sevelamer carbonate  1,600 mg Oral TID WC  . sodium chloride flush  10-40 mL Intracatheter Q12H  . spironolactone  25 mg Oral Daily  . verapamil  240 mg Oral Daily    BMET    Component Value Date/Time   NA 131* 10/26/2015 0730   K 4.4 10/26/2015 0730   CL 92* 10/26/2015 0730   CO2 22 10/26/2015 0730   GLUCOSE 130* 10/26/2015 0730   BUN 113* 10/26/2015 0730   CREATININE 10.08* 10/26/2015 0730   CALCIUM 9.6 10/26/2015 0730   GFRNONAA 6* 10/26/2015 0730   GFRAA 7* 10/26/2015 0730   CBC    Component Value  Date/Time   WBC 19.3* 10/26/2015 0730   RBC 2.57* 10/26/2015 0730   HGB 7.4* 10/26/2015 0730   HCT 21.8* 10/26/2015 0730   PLT 509* 10/26/2015 0730   MCV 84.8 10/26/2015 0730   MCH 28.8 10/26/2015 0730   MCHC 33.9 10/26/2015 0730   RDW 13.5 10/26/2015 0730   LYMPHSABS 1.8 10/22/2015 0353   MONOABS 0.7 10/22/2015 0353   EOSABS 0.0 10/22/2015 0353   BASOSABS 0.0 10/22/2015 0353     Assessment/Plan:  1. 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 Last HD session was on 10/16/15  1. Negative GN work up renal biopsy revealed TMA with ATN 2. Possibility of recovery however worsening prerenal azotemia and volume overload.  3. Started on HD 10/22/15 due to persistence of nausea and anorexia but then developed pulmonary edema/infiltrate 4. BUN rising out of proportion of Scr and did not respont to increased IVF's, decreased lasix, HD as above 5. Now has become oliguric and will need to start CLIP process and ask [facility] to evaluate for permanent access as he will likely require ongoing dialysis as an outpatient. 6. Plan for HD again today and get on TTS schedule. 2. Pulmonary: New infiltrate R>L worrisome for pneumonia now with worsening SOB and wheezing- agree with steroids and nebs. 1. Discussed with cardiology and Int Medicine, agree with transfer to ICU 2. S/p urgent HD 10/23/15 for UF and again 10/24/15 with improvement of symptoms. 3. Appreciate PCCM consult who believe source of infiltrate is combo of HCAP and volume 4. Improved with Bipap and UF. 5. Will cont to follow closely. 3. Anemia- hgb dropped from 12 to 8 now 7.3, concerning for pulmonary hemorrhage. PCCM to evaluate. 4. Vascular access- seen by [facility] for AVG/AVF, WBC improving.  Hopefully can have placement by Monday. 5. Malignant HTN- still not at goal. Recent increase of minoxidil and resumption of hydralazine. Consider clonidine 0.1mg  bid 6. Hyperphosphatemia 7. Chest  tightness with HD- CXR to evaluate HD catheter placement was unremarkable. ECHO was without pericardial effusion. Continue to workup per primary svc. 8. Disposition- will need to have outpatient HD arrangements and permanent access placed prior to discharge.  Has an issue with insurance and does not qualify for medicaid, however he may qualify  for medicare given ongoing need for HD.  Appreciate SW and CM assistance.  [name] Delorus Langwell

## 2015-10-26 NOTE — Progress Notes (Addendum)
  [facility] Progress Note  Right arm access with Dr. Edilia Boickson will be cancelled today. Patient is currently in HD and has not had vein mapping yet. Unsure whether he has an adequate vein conduit or not. If he requires a prosthetic graft, we would want to wait until his leukocytosis has significantly improved. Unable to determine access options until vein mapping completed. Will plan for access next week.   Joseph BergerKimberly Trinh, PA-C [facility] Office: [phone] Pager: [phone] 10/26/2015 8:33 AM  I have interviewed the patient and examined the patient. I agree with the findings by the PA. WBC has not improved. Still 19,000.  Vein map pending.  Will try to schedule for next week. If he is discharged, then it could be arranged as an out-pt.    Cari Carawayhris Pia Jedlicka, MD [phone]

## 2015-10-26 NOTE — Progress Notes (Signed)
OT Cancellation Note    10/26/15 0700  OT Visit Information  Last OT Received On 10/26/15  Reason Eval/Treat Not Completed Patient at procedure or test/ unavailable (HD)  Jesc LLCilary Zan Orlick, OTR/L  [phone] 10/26/2015

## 2015-10-26 NOTE — Progress Notes (Signed)
PT Cancellation Note  Patient Details Name: Joseph PetrinLaquan Villegas MRN: [id number] DOB: 12/26/1990   Cancelled Treatment:    Reason Eval/Treat Not Completed: Patient at procedure or test/unavailable (at HD).  Will attempt to see pt later today, schedule permitting.   Encarnacion ChuAshley Eliya Bubar PT, DPT  Pager: [phone] Phone: [phone] 10/26/2015, 8:46 AM

## 2015-10-26 NOTE — Progress Notes (Signed)
Pt transferred via wheelchair by NT to 6E10 on tele. Belongings sent with patient including phone, charger, and clothing. VSS. eICU and CCMD notified of transfer. Care assumed by Herbert SetaHeather, RN 6E.

## 2015-10-26 NOTE — Progress Notes (Signed)
Patient ID: Joseph Villegas, male   DOB: 26-Sep-1990, 25 y.o.   MRN: [id number]     Patient Name: Joseph Villegas Date of Encounter: 10/26/2015  Principal Problem:   CKD (chronic kidney disease) Active Problems:   HTN (hypertension)   Acute kidney injury (HCC)   Thrombocytopenia (HCC)   Hematuria   Leukocytosis   Accelerated hypertension   Streptococcal sore throat   Post-streptococcal glomerulonephritis   Essential hypertension   AKI (acute kidney injury) (HCC)   Dyspnea   Hypoxemia   Acute pulmonary edema [facility])   Primary Cardiologist: New - Dr. Eden Emms Patient Profile: Mr. Joseph Villegas is a 44M with malignant hypertension admitted with acute on chronic kidney failure and newly initiated HD. Cardiology consulted for chest pain and elevated troponin. His symptoms resolved with in/out foley catheterization and GERD treatment.   SUBJECTIVE: Seen in dialysis going well not needing bipap   OBJECTIVE Filed Vitals:   10/26/15 0714 10/26/15 0720 10/26/15 0730 10/26/15 0800  BP: 157/77 175/78 165/76 163/71  Pulse:  104 103 103  Temp: 98.3 F (36.8 C)     TempSrc: Oral     Resp:      Height:      Weight: 259 lb 4.2 oz (117.6 kg)     SpO2: 95%       Intake/Output Summary (Last 24 hours) at 10/26/15 0804 Last data filed at 10/26/15 0700  Gross per 24 hour  Intake   1432 ml  Output    275 ml  Net   1157 ml   Filed Weights   10/26/15 0319 10/26/15 0549 10/26/15 0714  Weight: 258 lb 6.1 oz (117.2 kg) 257 lb 1.6 oz (116.62 kg) 259 lb 4.2 oz (117.6 kg)    PHYSICAL EXAM Affect appropriate Obese black male  HEENT: normal Neck supple with no adenopathy JVP normal no bruits no thyromegaly  Right sublavian dialysis catheter  Lungs clear with no wheezing and good diaphragmatic motion Heart:  S1/S2 no murmur, no rub, gallop or click PMI normal Abdomen: benighn, BS positve, no tenderness, no AAA no bruit.  No HSM or HJR Distal pulses intact with no bruits Plus one  edema Neuro  non-focal Skin warm and dry No muscular weakness   LABS: CBC:  Recent Labs  10/25/15 0323 10/26/15 0730  WBC 21.0* 19.3*  HGB 7.1* 7.4*  HCT 21.7* 21.8*  MCV 85.8 84.8  PLT 454* 509*   Basic Metabolic Panel:  Recent Labs  78/29/56 0220 10/25/15 0439 10/26/15 0730  NA 132* 132* 131*  K 4.8 4.4 4.4  CL 97* 96* 92*  CO2 GLUCOSE 121* 135* 130*  BUN 70* 68* 113*  CREATININE 8.46* 7.66* 10.08*  CALCIUM 8.8* 9.1 9.6  PHOS 6.3* 6.5*  --    Liver Function Tests:  Recent Labs  10/25/15 0439 10/26/15 0730  AST 16 14*  ALT 12* 16*  ALKPHOS 33* 36*  BILITOT 0.5 0.6  PROT 7.2 7.2  ALBUMIN 2.8* 3.1*     Current facility-administered medications:  .  0.9 %  sodium chloride infusion, , Intravenous, Continuous, Terrial Rhodes, MD, Stopped at 10/25/15 1900 .  0.9 %  sodium chloride infusion, 100 mL, Intravenous, PRN, Terrial Rhodes, MD .  0.9 %  sodium chloride infusion, 100 mL, Intravenous, PRN, Terrial Rhodes, MD .  acetaminophen (TYLENOL) tablet 650 mg, 650 mg, Oral, Q6H PRN, 650 mg at 10/23/15 2217 **OR** acetaminophen (TYLENOL) suppository 650 mg, 650 mg, Rectal, Q6H PRN, Gwenyth Bender,  NP .  amLODipine (NORVASC) tablet 10 mg, 10 mg, Oral, Daily, Lesle Chris Black, NP, 10 mg at 10/25/15 0915 .  antiseptic oral rinse (CPC / CETYLPYRIDINIUM CHLORIDE 0.05%) solution 7 mL, 7 mL, Mouth Rinse, BID, Richarda Overlie, MD, 7 mL at 10/25/15 0915 .  atorvastatin (LIPITOR) tablet 20 mg, 20 mg, Oral, q1800, Richarda Overlie, MD, 20 mg at 10/25/15 1751 .  carvedilol (COREG) tablet 25 mg, 25 mg, Oral, BID WC, Beryle Lathe, MD, 25 mg at 10/25/15 1751 .  ceFEPIme (MAXIPIME) 1 g in dextrose 5 % 50 mL IVPB, 1 g, Intravenous, Q24H, Richarda Overlie, MD, 1 g at 10/25/15 1759 .  cloNIDine (CATAPRES) tablet 0.1 mg, 0.1 mg, Oral, BID, Inez Catalina, MD, 0.1 mg at 10/25/15 2256 .  famotidine (PEPCID) tablet 20 mg, 20 mg, Oral, QHS, Inez Catalina, MD, 20 mg at 10/25/15 2256 .  gi  cocktail (Maalox,Lidocaine,Donnatal), 30 mL, Oral, Q6H PRN, Dorothea Ogle, MD, 30 mL at 10/22/15 2241 .  heparin injection 2,300 Units, 20 Units/kg, Dialysis, PRN, Terrial Rhodes, MD .  heparin injection 5,000 Units, 5,000 Units, Subcutaneous, Q8H, Dorothea Ogle, MD, 5,000 Units at 10/26/15 0548 .  hydrALAZINE (APRESOLINE) injection 20 mg, 20 mg, Intravenous, Q6H PRN, Michael Litter, MD, 20 mg at 10/18/15 1456 .  hydrALAZINE (APRESOLINE) tablet 100 mg, 100 mg, Oral, Q8H, Arita Miss, MD, 100 mg at 10/26/15 0545 .  HYDROcodone-acetaminophen (NORCO/VICODIN) 5-325 MG per tablet 1-2 tablet, 1-2 tablet, Oral, Q4H PRN, Gwenyth Bender, NP, 2 tablet at 10/22/15 2049 .  levalbuterol (XOPENEX) nebulizer solution 1.25 mg, 1.25 mg, Nebulization, Q3H PRN, Rahul P Desai, PA-C .  metoprolol (LOPRESSOR) injection 5 mg, 5 mg, Intravenous, Q6H PRN, Dorothea Ogle, MD, 5 mg at 10/19/15 0230 .  minoxidil (LONITEN) tablet 7.5 mg, 7.5 mg, Oral, BID, Arita Miss, MD, 7.5 mg at 10/25/15 2256 .  morphine 2 MG/ML injection 1-2 mg, 1-2 mg, Intravenous, Q4H PRN, Dorothea Ogle, MD, 2 mg at 10/23/15 0740 .  nitroGLYCERIN (NITRODUR - Dosed in mg/24 hr) patch 0.2 mg, 0.2 mg, Transdermal, Once, Richarda Overlie, MD, 0.2 mg at 10/23/15 1245 .  nitroGLYCERIN 50 mg in dextrose 5 % 250 mL (0.2 mg/mL) infusion, 0-200 mcg/min, Intravenous, Titrated, Richarda Overlie, MD .  ondansetron (ZOFRAN) tablet 4 mg, 4 mg, Oral, Q6H PRN, 4 mg at 10/25/15 2256 **OR** ondansetron (ZOFRAN) injection 4 mg, 4 mg, Intravenous, Q6H PRN, Gwenyth Bender, NP, 4 mg at 10/22/15 1739 .  pantoprazole (PROTONIX) EC tablet 40 mg, 40 mg, Oral, Q0600, Kathlen Mody, MD, 40 mg at 10/26/15 0545 .  polyethylene glycol (MIRALAX / GLYCOLAX) packet 17 g, 17 g, Oral, Daily PRN, Lesle Chris Black, NP .  predniSONE (DELTASONE) tablet 60 mg, 60 mg, Oral, Q8H, Beryle Lathe, MD, 60 mg at 10/26/15 0545 .  sevelamer carbonate (RENVELA) tablet 1,600 mg, 1,600 mg, Oral, TID WC, Arita Miss, MD, 1,600 mg at 10/25/15 1751 .  sodium chloride flush (NS) 0.9 % injection 10-40 mL, 10-40 mL, Intracatheter, Q12H, Richarda Overlie, MD, 10 mL at 10/25/15 0918 .  sodium chloride flush (NS) 0.9 % injection 10-40 mL, 10-40 mL, Intracatheter, PRN, Richarda Overlie, MD .  spironolactone (ALDACTONE) tablet 25 mg, 25 mg, Oral, Daily, Lars Masson, MD, 25 mg at 10/25/15 0915 .  verapamil (CALAN-SR) CR tablet 240 mg, 240 mg, Oral, Daily, Lars Masson, MD, 240 mg at 10/25/15 0915 . sodium chloride Stopped (10/25/15 1900)  .  nitroGLYCERIN     TELE: NSR       ECG:  NSR LVH with strain   Radiology/Studies: Dg Chest 2 View  10/25/2015  CLINICAL DATA:  Dyspnea /sob  For few days EXAM: CHEST  2 VIEW COMPARISON:  10/23/2015 FINDINGS: Right-sided dialysis catheter tip overlies the level of the upper right atrium. Heart size is accentuated by shallow lung inflation and appears mildly enlarged. Significant right pulmonary opacities are again noted the there has been some improvement in aeration. There is no pneumothorax. There is minimal opacity at the left lung base. IMPRESSION: Bilateral pulmonary infiltrates, slightly improved on the right. Electronically Signed   By: Norva PavlovElizabeth  Brown M.D.   On: 10/25/2015 13:06     Current Medications:  . amLODipine  10 mg Oral Daily  . antiseptic oral rinse  7 mL Mouth Rinse BID  . atorvastatin  20 mg Oral q1800  . carvedilol  25 mg Oral BID WC  . ceFEPime (MAXIPIME) IV  1 g Intravenous Q24H  . cloNIDine  0.1 mg Oral BID  . famotidine  20 mg Oral QHS  . heparin subcutaneous  5,000 Units Subcutaneous Q8H  . hydrALAZINE  100 mg Oral Q8H  . minoxidil  7.5 mg Oral BID  . nitroGLYCERIN  0.2 mg Transdermal Once  . pantoprazole  40 mg Oral Q0600  . predniSONE  60 mg Oral Q8H  . sevelamer carbonate  1,600 mg Oral TID WC  . sodium chloride flush  10-40 mL Intracatheter Q12H  . spironolactone  25 mg Oral Daily  . verapamil  240 mg Oral Daily   . sodium  chloride Stopped (10/25/15 1900)  . nitroGLYCERIN      ASSESSMENT AND PLAN: Principal Problem:   CKD (chronic kidney disease) Active Problems:   HTN (hypertension)   Acute kidney injury (HCC)   Thrombocytopenia (HCC)   Hematuria   Leukocytosis   Accelerated hypertension   Streptococcal sore throat   Post-streptococcal glomerulonephritis   Essential hypertension   AKI (acute kidney injury) (HCC)   Dyspnea   Hypoxemia   Acute pulmonary edema (HCC)  1. Chest pain: resolved off nitro suspect MSK or pericardial  Minimal bump in troponin not significant in setting of renal failure EF normal by echo With no RWMA;s   and no effusioni   2. Malignant HTN:  Improved with less respiratory distress   3. Acute kidney injury: Nephrology following. Continue UF/dialysis weight no change today  Dr Durwin Noraixon indicated no shunt due to elevated WBC but I suspect A lot of this is demargination from steroids which can likely be tapered soon   4. ARDS:  Etiology of acute pneumonitis not entirely clear Asymmetric right lung worse  continue diuresis , steroids and bipap.  F/U CXR this am ordered.  CT suggests infiltrate On maxipime.    Will sign off  Charlton HawsPeter Peggi Yono

## 2015-10-26 NOTE — Evaluation (Signed)
Physical Therapy Evaluation and Discharge   Patient Details Name: Joseph PetrinLaquan Lockhart MRN: [id number] DOB: 21-Sep-1990 Today's Date: 10/26/2015   History of Present Illness  Pt is a 25 y/o M who presented with +strep screen and hypertensive urgency with h/o 1 wk sore throat, fever, malaise.  Pt admitted on 10/08/15.  Over the course of his hospitalization, he has had worsening SOB and increase in weight from 240lbs on admit (07/03) to 261lbs on 07/19. Pt's PMH includes AKI and now CKD stage 5 requiring intermittent dialysis.  Plan is for permanent access for HD placed prior to discharge.      Clinical Impression  Pt admitted with above diagnosis. Pt is independent with all mobility and tolerates challenges to his balance while ambulating.  HR up to 114 while ambulating.  Encouraged pt to ambulate in hall with nursing staff.  No skilled PT needs identified.  PT is signing off.    Follow Up Recommendations No PT follow up    Equipment Recommendations  None recommended by PT    Recommendations for Other Services       Precautions / Restrictions Precautions Precautions: None Restrictions Weight Bearing Restrictions: No      Mobility  Bed Mobility Overal bed mobility: Independent             General bed mobility comments: No cues or assist needed.   Transfers Overall transfer level: Independent Equipment used: None             General transfer comment: No cues or physical assist needed.    Ambulation/Gait Ambulation/Gait assistance: Independent Ambulation Distance (Feet): 400 Feet Assistive device: None Gait Pattern/deviations: WFL(Within Functional Limits)   Gait velocity interpretation: at or above normal speed for age/gender General Gait Details: Pt able to tolerate high level balance activities while ambulating.  HR up to 114, all other VSS.  Stairs            Wheelchair Mobility    Modified Rankin (Stroke Patients Only)       Balance Overall balance  assessment: Independent                           High level balance activites: Other (comment);Backward walking;Direction changes;Turns;Sudden stops;Head turns (picking item off floor, stepping over object)   Standardized Balance Assessment Standardized Balance Assessment : Dynamic Gait Index   Dynamic Gait Index Level Surface: Normal Change in Gait Speed: Normal Gait with Horizontal Head Turns: Normal Gait with Vertical Head Turns: Normal Gait and Pivot Turn: Normal Step Over Obstacle: Normal Step Around Obstacles: Normal       Pertinent Vitals/Pain Pain Assessment: No/denies pain    Home Living Family/patient expects to be discharged to:: Private residence Living Arrangements: Parent;Other relatives (2 brothers) Available Help at Discharge: Family;Available 24 hours/day Type of Home: Apartment Home Access: Level entry     Home Layout: One level Home Equipment: None      Prior Function Level of Independence: Independent         Comments: Works full time in Aeronautical engineerlandscaping.  Ind with all ADLs and mobility     Hand Dominance        Extremity/Trunk Assessment   Upper Extremity Assessment: Overall WFL for tasks assessed           Lower Extremity Assessment: Overall WFL for tasks assessed      Cervical / Trunk Assessment: Normal  Communication   Communication: No difficulties  Cognition Arousal/Alertness: Awake/alert  Behavior During Therapy: WFL for tasks assessed/performed Overall Cognitive Status: Within Functional Limits for tasks assessed                      General Comments      Exercises        Assessment/Plan    PT Assessment Patent does not need any further PT services  PT Diagnosis Difficulty walking   PT Problem List    PT Treatment Interventions     PT Goals (Current goals can be found in the Care Plan section) Acute Rehab PT Goals Patient Stated Goal: to go home PT Goal Formulation: All assessment and  education complete, DC therapy    Frequency     Barriers to discharge        Co-evaluation               End of Session Equipment Utilized During Treatment: Gait belt Activity Tolerance: Patient tolerated treatment well Patient left: in chair;with call bell/phone within reach Nurse Communication: Mobility status         Time: 1914-7829 PT Time Calculation (min) (ACUTE ONLY): 20 min   Charges:   PT Evaluation $PT Eval Low Complexity: 1 Procedure     PT G Codes:        Encarnacion Chu PT, DPT  Pager: [phone] Phone: [phone] 10/26/2015, 2:50 PM

## 2015-10-27 DIAGNOSIS — D72829 Elevated white blood cell count, unspecified: Secondary | ICD-10-CM

## 2015-10-27 LAB — GLUCOSE, CAPILLARY: GLUCOSE-CAPILLARY: 160 mg/dL — AB (ref 65–99)

## 2015-10-27 MED ORDER — DEXTROSE 5 % IV SOLN
2.0000 g | INTRAVENOUS | Status: DC
  Filled 2015-10-27: qty 2

## 2015-10-27 NOTE — Progress Notes (Signed)
Patient teaching performed regarding medical compliance.  Patient has expressed frustration with taking pills due to alteration in taste and decreased enjoyment of meals.  Recommended tips offered related to masking taste of pills to promote compliance.  Oral care encouraged.  Patient verbalizes understanding of need for compliance with teach back.  Will continue to monitor.

## 2015-10-27 NOTE — Progress Notes (Addendum)
Patient ID: Joseph Villegas, male   DOB: 12/25/1990, 25 y.o.   MRN: [id number]  [facility] Progress Note   Assessment/ Plan:   1. 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 Because of inability to show improvement of recovering renal clearance, he has been getting hemodialysis intermittently-now scheduled to undergo hemodialysis on a Tuesday/Thursday/Saturday schedule with the process initiated for outpatient dialysis unit placement. Permanent/long-term access also being pursued by [facility]-could not be done last week because of no vein mapping and his leukocytosis. Unclear if this is prolonged dialysis dependent AKI or progression to ESRD with renal biopsy indicating of some optimism. 2. Pneumonia: Chest x-ray indicative of dense right infiltrate for which he was started on treatment for HCAP-clinically appear to be doing better, will follow leukocyte counts (which has likely been elevated by ongoing prednisone use) 3. 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 Hypertension: Blood pressure control better with hemodialysis/compliance with ongoing therapy-continue low-sodium diet.  Subjective:   Reports to be feeling well-denies any chest pain or shortness of breath. Appetite unchanged.    Objective:   BP 126/60 mmHg  Pulse 93  Temp(Src) 98 F (36.7 C) (Oral)  Resp 20  Ht 5\' 10"  (1.778 m)  Wt 113.853 kg (251 lb)  BMI 36.01 kg/m2  SpO2 95%  Physical Exam: Gen: Comfortably resting in bed, in no distress CVS: Pulse regular rhythm, normal rate, S1 and S2 normal Resp: Fine rales right side, no rhonchi or wheeze Abd: Soft, obese, nontender Ext: No lower extremity  edema  Labs: BMET  Recent Labs Lab 10/21/15 0348 10/22/15 0353 10/23/15 0149 10/24/15 0220 10/25/15 0439 10/26/15 0730 10/26/15 0851  NA 129* 129* 132* 132* 132* 131*  --   K 3.3* 3.2* 3.8 4.8 4.4 4.4  --   CL 88* 92* 97* 97* 96* 92*  --   CO2 23 21* 21* 23 25 22   --   GLUCOSE 96 102* 93 121* 135* 130*  --   BUN 131* 128* 89* 70* 68* 113*  --   CREATININE 13.57* 13.35* 10.58* 8.46* 7.66* 10.08*  --   CALCIUM 8.8* 8.6* 8.6* 8.8* 9.1 9.6  --   PHOS 10.1* 8.7* 6.0* 6.3* 6.5*  --  8.7*   CBC  Recent Labs Lab 10/22/15 0353 10/23/15 0149 10/24/15 1303 10/25/15 0323 10/26/15 0730  WBC 13.6* 14.1* 18.8* 21.0* 19.3*  NEUTROABS 11.1*  --   --   --   --   HGB 7.8* 7.3* 7.6* 7.1* 7.4*  HCT 22.7* 22.0* 23.1* 21.7* 21.8*  MCV 82.2 84.6 85.6 85.8 84.8  PLT 357 323 448* 454* 509*   Medications:    . amLODipine  10 mg Oral Daily  . antiseptic oral rinse  7 mL Mouth Rinse BID  . atorvastatin  20 mg Oral q1800  . carvedilol  25 mg Oral BID WC  . ceFEPime (MAXIPIME) IV  1 g Intravenous Q24H  . cloNIDine  0.1 mg Oral BID  . famotidine  20 mg Oral QHS  . heparin subcutaneous  5,000 Units Subcutaneous Q8H  . hydrALAZINE  100 mg Oral Q8H  . minoxidil  7.5 mg Oral BID  . nitroGLYCERIN  0.2 mg Transdermal Once  . pantoprazole  40 mg Oral Q0600  .  predniSONE  50 mg Oral Q8H  . sevelamer carbonate  1,600 mg Oral TID WC  . sodium chloride flush  10-40 mL Intracatheter Q12H  . spironolactone  25 mg Oral Daily  . vancomycin  1,000 mg Intravenous Once  . verapamil  240 mg Oral Daily   Zetta Bills, MD 10/27/2015, 10:06 AM

## 2015-10-27 NOTE — Progress Notes (Signed)
PROGRESS NOTE    Joseph Villegas  WUJ:[id number] DOB: 11/01/1990 DOA: 10/08/2015 No PCP  Brief Narrative:   Joseph Villegas is a 25 y.o. 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 He was admitted 07/03 with 1wk hx of fever, cough, sore throat, weakness. He had rapid strep + and was started on amoxicillin. He also had hypertensive urgency, outpatient meds resumed (doses adjusted by cards) and clonidine added. Echo revealed severe LVH likely due to long standing HTN.  Over the course of his hospitalization, he has had worsening SOB and increase in weight from 240lbs on admit (07/03) to 261lbs on 07/19.  On 7/18, he had acute worsening of SOB along with increase in O2 needs. He had CT of the chest that revealed dense consolidation R > L along with small pleural effusions bilaterally. Findings most consistent with combo of pneumonia and pulmonary edema. Pt reports he subjectively feels better with BiPAP and after dialysis,    Assessment & Plan:   AKI vs. 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 - Renal ultrasound suggested CKD from HTN and ATN - Renal biopsy done 7/10 - Negative GN work up renal biopsy revealed TMA with ATN,Possibility of recovery however worsening prerenal azotemia and volume overload.Restarted  on HD 10/22/15 due to persistence of nausea and anorexia but then developed pulmonary edema/infiltrate -  Now has become oliguric and will need to start CLIP process and ask [facility] to evaluate for permanent access as he will likely require ongoing dialysis as an outpatient. Lasix has been discontinued Plan for HD again today and get on TTS schedule, patient needs vein mapping,Christopher Adele Dan, MD would like leukocytosis to resolve, prior to graft placement or  surgery    Accelerated HTN with urgency -- On amlodipine, coreg,  , Hydralazine, minoxidil, verapamil, clonidine,  Aldactone On echo he has severe LVH and an intracavitary gradient. He also has a murmur on exam. This is due to his hypertension and LVH, not HCM.     Acute hypoxic respiratory failure - likely multifactorial in the setting of HCAP + volume overload / pulm edema Pro calcitonin 3.36 DDx of pulmonary edema vs HCAP (air bronchograms on CT) vs pulmonary hemorrhage post   right IJ HD cath placement 7/10 > highly doubt so given no hx hemoptysis at all. CT most c/w PNA and vol overload.   patient's respiratory status is slowly improving with dialysis currently requiring 3 L of oxygen at rest. To bronch will require intubation right now, even if pulmonary hemorrhage treatment will be supportive so risk well outweighs the benefit for bronchoscopy. No indication for bronchoscopy per PCCM tx for HCAP with cefepime and vanc , will stop after a total of 7 days   Chest pain:  In the setting of pulmonary edema, currently on a nitro drip. Although his EKG does have changes, they are consistent with LVH with repolarization abnormality. His echo is reassuring and does not reveal any wall motion abnormalities. Troponin is elevated, but stable and he has AoCKD.  troponins  remained in the range of 0.8-0.95 , now trending down ECG with LVH. BP improved on Rx continue dialysis will arrange outpatient F/u with Dr Duke Salvia consider myovue in 6-8 weeks as per Wendall Stade, MD  Cardiology has signed off    Anemia-likely anemia of chronic disease Patient  had a drop in hemoglobin from 12->8.8 on 7/16, now 7.4, transfuse  with hemodialysis, will defer to nephrology Transfuse if hemoglobin drops further  Strep throat - Treated with amoxicillin, completed  Acute urinary retention with spasms - Patient insistent that foley come out 7/15 -  - Continue morphine PRN  pain   Hyponatremia - Stable and mild, likely due to renal failure  Hypokalemia 4.8 today, recheck in the AM  Leukocytosis - Unclear cause, improved initially and now  21.0>19.8   pro-calcitonin 3.36 - Started on treatment for healthcare associated pneumonia   DVT prophylaxis: Heparin Code Status: Full Family Communication: None today Disposition Plan: will need to have outpatient HD arrangements and permanent access placed prior to discharge   Consultants:   Nephrology  IR  Cardiology  Pulmonary  Procedures:  1. Renal biopsy by IR 7/10 2. IR placed R IJ TDC 7/10 3. Echocardiogram.  Antimicrobials:   Amoxicillin, completed course   Vancomycin/cefepime on 7/18-   Subjective: Patient is off BiPAP, now requiring 3 L of oxygen , in good spirits  Objective: Filed Vitals:   10/26/15 1841 10/26/15 2128 10/27/15 0439 10/27/15 0802  BP: 155/60 168/89 145/72 126/60  Pulse: 98 114 101 93  Temp: 98.4 F (36.9 C) 98.4 F (36.9 C) 98.7 F (37.1 C) 98 F (36.7 C)  TempSrc: Oral   Oral  Resp: 18 23 23 20   Height:      Weight:  113.853 kg (251 lb)    SpO2: 100% 92% 95% 95%    Intake/Output Summary (Last 24 hours) at 10/27/15 0936 Last data filed at 10/27/15 0500  Gross per 24 hour  Intake   1080 ml  Output   3000 ml  Net  -1920 ml   Filed Weights   10/26/15 0714 10/26/15 1050 10/26/15 2128  Weight: 117.6 kg (259 lb 4.2 oz) 114.3 kg (251 lb 15.8 oz) 113.853 kg (251 lb)    Examination:  General exam: Appears calm and comfortable  Eyes: EOMI, anicteric sclerae Respiratory system: Respiratory effort normal. No audible wheezing Cardiovascular system: RR, NR. No murmur Gastrointestinal system: Abdomen is nondistended, soft and nontender.  +BS Central nervous system: Alert and oriented. No focal neurological deficits. Skin: No new rashes, lesions or ulcers Psychiatry: Judgement and insight appear normal. Mood & affect appropriate.     Data  Reviewed:  CBC:  Recent Labs Lab 10/22/15 0353 10/23/15 0149 10/24/15 1303 10/25/15 0323 10/26/15 0730  WBC 13.6* 14.1* 18.8* 21.0* 19.3*  NEUTROABS 11.1*  --   --   --   --   HGB 7.8* 7.3* 7.6* 7.1* 7.4*  HCT 22.7* 22.0* 23.1* 21.7* 21.8*  MCV 82.2 84.6 85.6 85.8 84.8  PLT 357 323 448* 454* 509*   Basic Metabolic Panel:  Recent Labs Lab 10/22/15 0353 10/23/15 0149 10/24/15 0220 10/25/15 0439 10/26/15 0730 10/26/15 0851  NA 129* 132* 132* 132* 131*  --   K 3.2* 3.8 4.8 4.4 4.4  --   CL 92* 97* 97* 96* 92*  --   CO2 21* 21* 23 25 22   --   GLUCOSE 102* 93 121* 135* 130*  --   BUN 128* 89* 70* 68* 113*  --   CREATININE 13.35* 10.58* 8.46* 7.66* 10.08*  --   CALCIUM 8.6* 8.6* 8.8* 9.1 9.6  --   PHOS 8.7* 6.0* 6.3* 6.5*  --  8.7*   GFR: Estimated Creatinine Clearance: 14 mL/min (by C-G formula based on Cr of 10.08). Liver Function Tests:  Recent Labs Lab 10/22/15 0353 10/23/15 0149 10/24/15 0220 10/25/15 0439 10/26/15 0730  AST  --  18  --  16 14*  ALT  --  8*  --  12* 16*  ALKPHOS  --  34*  --  33* 36*  BILITOT  --  0.8  --  0.5 0.6  PROT  --  6.7  --  7.2 7.2  ALBUMIN 3.1* 3.0* 2.7* 2.8* 3.1*   No results for input(s): LIPASE, AMYLASE in the last 168 hours. No results for input(s): AMMONIA in the last 168 hours. Coagulation Profile: No results for input(s): INR, PROTIME in the last 168 hours. Cardiac Enzymes:  Recent Labs Lab 10/23/15 0806 10/23/15 1855  TROPONINI 0.30* 0.24*   BNP (last 3 results) No results for input(s): PROBNP in the last 8760 hours. HbA1C: No results for input(s): HGBA1C in the last 72 hours. CBG: No results for input(s): GLUCAP in the last 168 hours. Lipid Profile: No results for input(s): CHOL, HDL, LDLCALC, TRIG, CHOLHDL, LDLDIRECT in the last 72 hours.    Radiology Studies: Dg Chest 2 View  10/25/2015  CLINICAL DATA:  Dyspnea /sob  For few days EXAM: CHEST  2 VIEW COMPARISON:  10/23/2015 FINDINGS: Right-sided  dialysis catheter tip overlies the level of the upper right atrium. Heart size is accentuated by shallow lung inflation and appears mildly enlarged. Significant right pulmonary opacities are again noted the there has been some improvement in aeration. There is no pneumothorax. There is minimal opacity at the left lung base. IMPRESSION: Bilateral pulmonary infiltrates, slightly improved on the right. Electronically Signed   By: Norva Pavlov M.D.   On: 10/25/2015 13:06        Scheduled Meds: . amLODipine  10 mg Oral Daily  . antiseptic oral rinse  7 mL Mouth Rinse BID  . atorvastatin  20 mg Oral q1800  . carvedilol  25 mg Oral BID WC  . ceFEPime (MAXIPIME) IV  1 g Intravenous Q24H  . cloNIDine  0.1 mg Oral BID  . famotidine  20 mg Oral QHS  . heparin subcutaneous  5,000 Units Subcutaneous Q8H  . hydrALAZINE  100 mg Oral Q8H  . minoxidil  7.5 mg Oral BID  . nitroGLYCERIN  0.2 mg Transdermal Once  . pantoprazole  40 mg Oral Q0600  . predniSONE  50 mg Oral Q8H  . sevelamer carbonate  1,600 mg Oral TID WC  . sodium chloride flush  10-40 mL Intracatheter Q12H  . spironolactone  25 mg Oral Daily  . vancomycin  1,000 mg Intravenous Once  . verapamil  240 mg Oral Daily   Continuous Infusions: . sodium chloride Stopped (10/25/15 1900)  . nitroGLYCERIN       LOS: 19 days    Time spent: 25 minutes    Richarda Overlie, MD [facility] Pager [phone]  If 7PM-7AM, please contact night-coverage [email or web address] Password [facility] 10/27/2015, 9:36 AM

## 2015-10-27 NOTE — Consult Note (Addendum)
Joseph Villegas for Infectious Disease  Date of Admission:  10/08/2015  Date of Consult:  10/27/2015  Reason for Consult: Leukopcytosis Referring Physician: Allyson Sabal  Impression/Recommendation Leukocytosis HCAP Would suggest his WBC is due to recent steroids.  Watch WBC (if not better, repeat CXR, CT. Consider renal u/s to eval for abscess/hematoma post Bx) Would stop vanco Plan on 8 days of cefepime.  Clinically he looks well and would like to go home.   ESRD  HTN  Obesity  Thank you so much for this interesting consult,   Joseph Villegas (pager) [phone] [email or web address]  Joseph Villegas is an 25 y.o. male.  HPI: 25 yo M with hx of HTN, streptococcal pharyngitis giving rise to renal failure and admission on 7-3. He was treated with amoxil. His w/u was notable for CRI on echo, suggesting that he ARF was not due to strep but instead just newly recognized. His w/u did not support glomerulonephritis dx.  On 7-10 he underwent renal bx showing: thrombotic microangiopathy, FSGN. He also had HD cath placed.  His case was further complicated by cardiac even- lateral ST changes, troponin to 0.9. This was later felt not to be ACS. HIs ECG changes were felt to be due to LVH.  He developed SOB and wheezing around 7-19 and was found to have new infiltrate R > L. He was started on steroids. He was transferred to ICU. On CT he was found to have pulm edema and pneumonia. He was deffered from BAL due to need for intbx. He was started on vanco/cefepime, had urgent ultrafiltrate. He also required BiPAP.   Due to leukocytosis, we are asked to see to help eval his HD access.    Afebrile since 7-19  WBC 14.1 (7-18)  21.0 (7-20)  19.3 (7-21)   Past Medical History  Diagnosis Date  . Hypertension     Past Surgical History  Procedure Laterality Date  . Pilonidal cyst / sinus excision  2008     No Known Allergies  Medications:  Scheduled: . amLODipine  10 mg Oral Daily  .  antiseptic oral rinse  7 mL Mouth Rinse BID  . atorvastatin  20 mg Oral q1800  . carvedilol  25 mg Oral BID WC  . ceFEPime (MAXIPIME) IV  1 g Intravenous Q24H  . cloNIDine  0.1 mg Oral BID  . famotidine  20 mg Oral QHS  . heparin subcutaneous  5,000 Units Subcutaneous Q8H  . hydrALAZINE  100 mg Oral Q8H  . minoxidil  7.5 mg Oral BID  . pantoprazole  40 mg Oral Q0600  . predniSONE  50 mg Oral Q8H  . sevelamer carbonate  1,600 mg Oral TID WC  . sodium chloride flush  10-40 mL Intracatheter Q12H  . spironolactone  25 mg Oral Daily  . vancomycin  1,000 mg Intravenous Once  . verapamil  240 mg Oral Daily    Abtx:  Anti-infectives    Start     Dose/Rate Route Frequency Ordered Stop   10/26/15 1115  vancomycin (VANCOCIN) IVPB 1000 mg/200 mL premix     1,000 mg 200 mL/hr over 60 Minutes Intravenous  Once 10/26/15 1107     10/25/15 1600  vancomycin (VANCOCIN) IVPB 1000 mg/200 mL premix  Status:  Discontinued     1,000 mg 200 mL/hr over 60 Minutes Intravenous Every T-Th-Sa (Hemodialysis) 10/25/15 1225 10/25/15 1909   10/24/15 1800  ceFEPIme (MAXIPIME) 1 g in dextrose 5 % 50 mL IVPB     1  g 100 mL/hr over 30 Minutes Intravenous Every 24 hours 10/24/15 1327     10/24/15 1615  vancomycin (VANCOCIN) IVPB 1000 mg/200 mL premix     1,000 mg 200 mL/hr over 60 Minutes Intravenous  Once 10/24/15 1607 10/24/15 1815   10/23/15 1245  vancomycin (VANCOCIN) 1,500 mg in sodium chloride 0.9 % 500 mL IVPB     1,500 mg 250 mL/hr over 120 Minutes Intravenous  Once 10/23/15 1241 10/23/15 2159   10/23/15 1245  ceFEPIme (MAXIPIME) 2 g in dextrose 5 % 50 mL IVPB  Status:  Discontinued     2 g 100 mL/hr over 30 Minutes Intravenous  Once 10/23/15 1241 10/24/15 1327   10/15/15 1115  ceFAZolin (ANCEF) IVPB 2g/100 mL premix     2 g 200 mL/hr over 30 Minutes Intravenous To Radiology 10/15/15 1106 10/16/15 1115   10/15/15 1000  ceFAZolin (ANCEF) IVPB 2g/100 mL premix     2 g 200 mL/hr over 30 Minutes Intravenous   Once 10/15/15 0952 10/15/15 1023   10/15/15 0949  ceFAZolin (ANCEF) 2-4 GM/100ML-% IVPB    Comments:  Duininck, Stacey   : cabinet override      10/15/15 0949 10/15/15 2159   10/08/15 2000  amoxicillin (AMOXIL) capsule 500 mg  Status:  Discontinued     500 mg Oral Every 12 hours 10/08/15 1339 10/14/15 1609   10/08/15 1015  cephALEXin (KEFLEX) capsule 500 mg     500 mg Oral  Once 10/08/15 1008 10/08/15 1053      Total days of antibiotics: 4 vanco/cefepime          Social History:  reports that he has never smoked. He has never used smokeless tobacco. He reports that he does not drink alcohol or use illicit drugs.  Family History  Problem Relation Age of Onset  . Hypertension Mother   . Diabetes Mother   . Hypertension Brother   . Diabetes Sister   . CAD Father     General ROS: no cough, no sob. no diarrhea, no muscle or joint swelling, see HPI.   Blood pressure 126/60, pulse 93, temperature 98 F (36.7 C), temperature source Oral, resp. rate 20, height 5' 10"  (1.778 m), weight 113.853 kg (251 lb), SpO2 95 %. General appearance: alert, cooperative and no distress Eyes: negative findings: conjunctivae and sclerae normal and pupils equal, round, reactive to light and accomodation Throat: lips, mucosa, and tongue normal; teeth and gums normal Neck: no adenopathy and supple, symmetrical, trachea midline Lungs: clear to auscultation bilaterally and HD line in R chest is non-tender, no d/c.  Heart: regular rate and rhythm Abdomen: normal findings: bowel sounds normal and soft, non-tender Extremities: edema anasarca and small blister on R great toe. o/w no lesions.  No cordis felt in UE or LE.     Results for orders placed or performed during the hospital encounter of 10/08/15 (from the past 48 hour(s))  Procalcitonin     Status: None   Collection Time: 10/26/15  7:30 AM  Result Value Ref Range   Procalcitonin 2.60 ng/mL    Comment:        Interpretation: PCT > 2  ng/mL: Systemic infection (sepsis) is likely, unless other causes are known. (NOTE)         ICU PCT Algorithm               Non ICU PCT Algorithm    ----------------------------     ------------------------------  PCT < 0.25 ng/mL                 PCT < 0.1 ng/mL     Stopping of antibiotics            Stopping of antibiotics       strongly encouraged.               strongly encouraged.    ----------------------------     ------------------------------       PCT level decrease by               PCT < 0.25 ng/mL       >= 80% from peak PCT       OR PCT 0.25 - 0.5 ng/mL          Stopping of antibiotics                                             encouraged.     Stopping of antibiotics           encouraged.    ----------------------------     ------------------------------       PCT level decrease by              PCT >= 0.25 ng/mL       < 80% from peak PCT        AND PCT >= 0.5 ng/mL            Continuing antibiotics                                               encouraged.       Continuing antibiotics            encouraged.    ----------------------------     ------------------------------     PCT level increase compared          PCT > 0.5 ng/mL         with peak PCT AND          PCT >= 0.5 ng/mL             Escalation of antibiotics                                          strongly encouraged.      Escalation of antibiotics        strongly encouraged.   CBC     Status: Abnormal   Collection Time: 10/26/15  7:30 AM  Result Value Ref Range   WBC 19.3 (H) 4.0 - 10.5 K/uL   RBC 2.57 (L) 4.22 - 5.81 MIL/uL   Hemoglobin 7.4 (L) 13.0 - 17.0 g/dL   HCT 21.8 (L) 39.0 - 52.0 %   MCV 84.8 78.0 - 100.0 fL   MCH 28.8 26.0 - 34.0 pg   MCHC 33.9 30.0 - 36.0 g/dL   RDW 13.5 11.5 - 15.5 %   Platelets 509 (H) 150 - 400 K/uL  Comprehensive metabolic panel     Status: Abnormal   Collection Time: 10/26/15  7:30 AM  Result Value Ref Range   Sodium 131 (L)  135 - 145 mmol/L   Potassium 4.4  3.5 - 5.1 mmol/L   Chloride 92 (L) 101 - 111 mmol/L   CO2 22 22 - 32 mmol/L   Glucose, Bld 130 (H) 65 - 99 mg/dL   BUN 113 (H) 6 - 20 mg/dL   Creatinine, Ser 10.08 (H) 0.61 - 1.24 mg/dL   Calcium 9.6 8.9 - 10.3 mg/dL   Total Protein 7.2 6.5 - 8.1 g/dL   Albumin 3.1 (L) 3.5 - 5.0 g/dL   AST 14 (L) 15 - 41 U/L   ALT 16 (L) 17 - 63 U/L   Alkaline Phosphatase 36 (L) 38 - 126 U/L   Total Bilirubin 0.6 0.3 - 1.2 mg/dL   GFR calc non Af Amer 6 (L) >60 mL/min   GFR calc Af Amer 7 (L) >60 mL/min    Comment: (NOTE) The eGFR has been calculated using the CKD EPI equation. This calculation has not been validated in all clinical situations. eGFR's persistently <60 mL/min signify possible Chronic Kidney Disease.    Anion gap 17 (H) 5 - 15  Phosphorus     Status: Abnormal   Collection Time: 10/26/15  8:51 AM  Result Value Ref Range   Phosphorus 8.7 (H) 2.5 - 4.6 mg/dL  Glucose, capillary     Status: Abnormal   Collection Time: 10/27/15 11:30 AM  Result Value Ref Range   Glucose-Capillary 160 (H) 65 - 99 mg/dL   Comment 1 Notify RN    Comment 2 Document in Chart       Component Value Date/Time   SDES BLOOD HEMODIALYSIS CATHETER 10/24/2015 1550   SPECREQUEST BOTTLES DRAWN AEROBIC AND ANAEROBIC 10CC 10/24/2015 1550   CULT NO GROWTH 2 DAYS 10/24/2015 1550   REPTSTATUS PENDING 10/24/2015 1550   No results found. Recent Results (from the past 240 hour(s))  Culture, Urine     Status: None   Collection Time: 10/19/15  5:05 AM  Result Value Ref Range Status   Specimen Description URINE, CLEAN CATCH  Final   Special Requests NONE  Final   Culture NO GROWTH  Final   Report Status 10/20/2015 FINAL  Final  Culture, blood (Routine X 2) w Reflex to ID Panel     Status: None (Preliminary result)   Collection Time: 10/24/15  3:35 PM  Result Value Ref Range Status   Specimen Description BLOOD HEMODIALYSIS CATHETER  Final   Special Requests BOTTLES DRAWN AEROBIC AND ANAEROBIC 10CC  Final    Culture NO GROWTH 2 DAYS  Final   Report Status PENDING  Incomplete  Culture, blood (Routine X 2) w Reflex to ID Panel     Status: None (Preliminary result)   Collection Time: 10/24/15  3:50 PM  Result Value Ref Range Status   Specimen Description BLOOD HEMODIALYSIS CATHETER  Final   Special Requests BOTTLES DRAWN AEROBIC AND ANAEROBIC 10CC  Final   Culture NO GROWTH 2 DAYS  Final   Report Status PENDING  Incomplete  MRSA PCR Screening     Status: None   Collection Time: 10/25/15  8:51 AM  Result Value Ref Range Status   MRSA by PCR NEGATIVE NEGATIVE Final    Comment:        The GeneXpert MRSA Assay (FDA approved for NASAL specimens only), is one component of a comprehensive MRSA colonization surveillance program. It is not intended to diagnose MRSA infection nor to guide or monitor treatment for MRSA infections.       10/27/2015, 12:23 PM  LOS: 19 days    Records and images were personally reviewed where available.

## 2015-10-27 NOTE — Progress Notes (Signed)
Patient refusing labs this morning. Lab revisited. Patient continues to refuse. Discussed with patient. Patient states that he is tired of being stuck. Will notify MD. Will cancel labs and continue to monitor.Bess Kinds, RN

## 2015-10-27 NOTE — Progress Notes (Signed)
OT Cancellation Note  Patient Details Name: Joseph Villegas MRN: [id number] DOB: [birth year]   Cancelled Treatment:    Reason Eval/Treat Not Completed: OT screened, no needs identified, will sign off - Pt reports he has been up and performing ADLs without difficulty, does not feel he needs therapy intervention.  Will sign off   [employer], OTR/L [phone]   Jeani Hawking M 10/27/2015, 10:20 AM

## 2015-10-27 NOTE — Progress Notes (Signed)
Noted order for IV antibiotic per pharmacy scheduled daily. IV consult initiated for peripheral access. Poor venous access. Unable to gain IV access. Pt refusing any further IV sticks. Pharmacy notified. Will change medication to dose for dialysis days. No hemodialysis scheduled for today. Will continue to monitor. Bess Kinds, RN

## 2015-10-28 LAB — SURGICAL PCR SCREEN
MRSA, PCR: NEGATIVE
Staphylococcus aureus: NEGATIVE

## 2015-10-28 SURGERY — ARTERIOVENOUS (AV) FISTULA CREATION
Anesthesia: General | Site: Arm Lower | Laterality: Left

## 2015-10-28 MED ORDER — LANTHANUM CARBONATE 500 MG PO CHEW
1000.0000 mg | CHEWABLE_TABLET | Freq: Three times a day (TID) | ORAL | Status: DC
  Filled 2015-10-28 (×3): qty 2

## 2015-10-28 MED ORDER — PREDNISONE 50 MG PO TABS
50.0000 mg | ORAL_TABLET | Freq: Every day | ORAL | Status: DC
  Administered 2015-10-30: 50 mg via ORAL
  Filled 2015-10-28: qty 1

## 2015-10-28 NOTE — Progress Notes (Signed)
Patient refusal of AM lab draw despite reinforced teaching.

## 2015-10-28 NOTE — Progress Notes (Signed)
PROGRESS NOTE    Joseph Villegas  WUJ:[id number] DOB: [birth year] DOA: 10/08/2015 No PCP  Brief Narrative:   Joseph Villegas is a 25 y.o. 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 He was admitted 07/03 with 1wk hx of fever, cough, sore throat, weakness. He had rapid strep + and was started on amoxicillin. He also had hypertensive urgency, outpatient meds resumed (doses adjusted by cards) and clonidine added. Echo revealed severe LVH likely due to long standing HTN.  Over the course of his hospitalization, he has had worsening SOB and increase in weight from 240lbs on admit (07/03) to 261lbs on 07/19.  On 7/18, he had acute worsening of SOB along with increase in O2 needs. He had CT of the chest that revealed dense consolidation R > L along with small pleural effusions bilaterally. Findings most consistent with combo of pneumonia and pulmonary edema. Pt reports he subjectively feels better with BiPAP and after dialysis,    Assessment & Plan:   AKI vs. 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 Because of inability to show improvement of recovering renal clearance, he has been getting hemodialysis intermittently-now scheduled to undergo hemodialysis on a Tuesday/Thursday/Saturday schedule with the process initiated for outpatient dialysis unit placement. Permanent/long-term access also being pursued by [facility]-could not be done last week because of no vein mapping and his leukocytosis.  [facility] to evaluate for permanent access as he will likely require ongoing dialysis as an outpatient. Lasix has been discontinued  patient needs vein mapping,Christopher Adele Dan, MD has  been infectious disease leukocytosis likely secondary to steroids,  Discussed with Dr Allena Katz, no need for CT abdomen pelvis to rule out perinephric hematoma as no fever , if he spikes fever will order renal USG  Patient has been refusing labs for the last 2 days   Accelerated HTN with urgency -- On amlodipine, coreg,  , Hydralazine, minoxidil, verapamil, clonidine,  Aldactone On echo he has severe LVH and an intracavitary gradient. He also has a murmur on exam. This is due to his hypertension and LVH, not HCM.     Acute hypoxic respiratory failure - likely multifactorial in the setting of HCAP + volume overload / pulm edema Pro calcitonin 3.36 DDx of pulmonary edema vs HCAP (air bronchograms on CT) vs pulmonary hemorrhage post   right IJ HD cath placement 7/10 > highly doubt so given no hx hemoptysis at all. CT most c/w PNA and vol overload.   patient's respiratory status is slowly improving with dialysis currently requiring 3 L of oxygen at rest. To bronch will require intubation right now, even if pulmonary hemorrhage treatment will be supportive so risk well outweighs the benefit for bronchoscopy. No indication for bronchoscopy per PCCM tx for HCAP with cefepime and vanc , vancomycin discontinued 7/22, ID recommends to continue with cefepime for a total of 8 days and then discontinue  Continue to taper steroids, change prednisone to  50 mg once a day   Chest pain:  In the setting of pulmonary edema, currently on a nitro drip. Although his EKG does have changes, they are consistent with LVH with repolarization abnormality. His echo is reassuring and does not reveal any wall motion abnormalities. Troponin is elevated, but stable and he  has AoCKD.  troponins  remained in the range of 0.8-0.95 , now trending down ECG with LVH. BP improved on Rx continue dialysis will arrange outpatient F/u with Dr Duke Salvia consider myovue in 6-8 weeks as per Wendall Stade, MD  Cardiology has signed  off    Anemia-likely anemia of chronic disease Patient had a drop in hemoglobin from 12->8.8 on 7/16, now 7.4, transfuse  with hemodialysis, will defer to nephrology. Patient has been refusing labs for the last 2 days Transfuse if hemoglobin drops further  Strep throat - Treated with amoxicillin, completed  Acute urinary retention with spasms - Patient insistent that foley come out 7/15 -  - Continue morphine PRN pain   Hyponatremia - Stable and mild, likely due to renal failure  Hypokalemia 4.8 today, recheck in the AM  Leukocytosis - Unclear cause, improved initially and now  21.0>19.8, patient now refusing labs   pro-calcitonin 3.36 - Started on treatment for healthcare associated pneumonia   DVT prophylaxis: Heparin Code Status: Full Family Communication: None today Disposition Plan: will need to have outpatient HD arrangements and permanent access placed prior to discharge. Patient  is refusing a.m. labs   Consultants:   Nephrology  IR  Cardiology  Pulmonary  Procedures:  1. Renal biopsy by IR 7/10 2. IR placed R IJ TDC 7/10 3. Echocardiogram.  Antimicrobials:   Amoxicillin, completed course   Vancomycin/cefepime on 7/18-   Subjective: Off oxygen, breathing is much better   Objective: Vitals:   10/27/15 1723 10/27/15 2113 10/28/15 0459 10/28/15 0845  BP: (!) 151/82 (!) 161/94 (!) 166/87 (!) 157/73  Pulse: 96 (!) 107 (!) 107 (!) 106  Resp: 20 (!) 2 18 18   Temp: 98.5 F (36.9 C) 98.4 F (36.9 C) 98.5 F (36.9 C) 98.6 F (37 C)  TempSrc: Oral Oral Oral Oral  SpO2: 96% 98% 95% 99%  Weight:  116.1 kg (256 lb)    Height:        Intake/Output Summary (Last 24 hours) at 10/28/15 1610 Last data filed at 10/28/15 0800  Gross per 24 hour  Intake             1200 ml  Output              820 ml  Net              380 ml   Filed Weights   10/26/15 1050 10/26/15 2128 10/27/15 2113  Weight: 114.3 kg (251 lb 15.8 oz) 113.9 kg (251 lb) 116.1 kg  (256 lb)    Examination:  General exam: Appears calm and comfortable  Eyes: EOMI, anicteric sclerae Respiratory system: Respiratory effort normal. No audible wheezing Cardiovascular system: RR, NR. No murmur Gastrointestinal system: Abdomen is nondistended, soft and nontender.  +BS Central nervous system: Alert and oriented. No focal neurological deficits. Skin: No new rashes, lesions or ulcers Psychiatry: Judgement and insight appear normal. Mood & affect appropriate.     Data Reviewed:  CBC:  Recent Labs Lab 10/22/15 0353 10/23/15 0149 10/24/15 1303 10/25/15 0323 10/26/15 0730  WBC 13.6* 14.1* 18.8* 21.0* 19.3*  NEUTROABS 11.1*  --   --   --   --   HGB 7.8* 7.3* 7.6* 7.1* 7.4*  HCT 22.7* 22.0* 23.1* 21.7* 21.8*  MCV 82.2 84.6 85.6 85.8 84.8  PLT 357 323 448* 454* 509*   Basic Metabolic Panel:  Recent Labs Lab 10/22/15 0353 10/23/15 0149 10/24/15 0220 10/25/15 0439 10/26/15 0730 10/26/15 0851  NA  129* 132* 132* 132* 131*  --   K 3.2* 3.8 4.8 4.4 4.4  --   CL 92* 97* 97* 96* 92*  --   CO2 21* 21* 23 25 22   --   GLUCOSE 102* 93 121* 135* 130*  --   BUN 128* 89* 70* 68* 113*  --   CREATININE 13.35* 10.58* 8.46* 7.66* 10.08*  --   CALCIUM 8.6* 8.6* 8.8* 9.1 9.6  --   PHOS 8.7* 6.0* 6.3* 6.5*  --  8.7*   GFR: Estimated Creatinine Clearance: 14.2 mL/min (by C-G formula based on SCr of 10.08 mg/dL). Liver Function Tests:  Recent Labs Lab 10/22/15 0353 10/23/15 0149 10/24/15 0220 10/25/15 0439 10/26/15 0730  AST  --  18  --  16 14*  ALT  --  8*  --  12* 16*  ALKPHOS  --  34*  --  33* 36*  BILITOT  --  0.8  --  0.5 0.6  PROT  --  6.7  --  7.2 7.2  ALBUMIN 3.1* 3.0* 2.7* 2.8* 3.1*   No results for input(s): LIPASE, AMYLASE in the last 168 hours. No results for input(s): AMMONIA in the last 168 hours. Coagulation Profile: No results for input(s): INR, PROTIME in the last 168 hours. Cardiac Enzymes:  Recent Labs Lab 10/23/15 0806 10/23/15 1855    TROPONINI 0.30* 0.24*   BNP (last 3 results) No results for input(s): PROBNP in the last 8760 hours. HbA1C: No results for input(s): HGBA1C in the last 72 hours. CBG:  Recent Labs Lab 10/27/15 1130  GLUCAP 160*   Lipid Profile: No results for input(s): CHOL, HDL, LDLCALC, TRIG, CHOLHDL, LDLDIRECT in the last 72 hours.    Radiology Studies: No results found.      Scheduled Meds: . amLODipine  10 mg Oral Daily  . antiseptic oral rinse  7 mL Mouth Rinse BID  . atorvastatin  20 mg Oral q1800  . carvedilol  25 mg Oral BID WC  . cloNIDine  0.1 mg Oral BID  . famotidine  20 mg Oral QHS  . heparin subcutaneous  5,000 Units Subcutaneous Q8H  . hydrALAZINE  100 mg Oral Q8H  . lanthanum  1,000 mg Oral TID WC  . minoxidil  7.5 mg Oral BID  . pantoprazole  40 mg Oral Q0600  . predniSONE  50 mg Oral Q8H  . sodium chloride flush  10-40 mL Intracatheter Q12H  . spironolactone  25 mg Oral Daily  . verapamil  240 mg Oral Daily   Continuous Infusions:     LOS: 20 days    Time spent: 25 minutes    Richarda Overlie, MD [facility] Pager [phone]  If 7PM-7AM, please contact night-coverage [email or web address] Password [facility] 10/28/2015, 9:21 AM

## 2015-10-28 NOTE — Plan of Care (Signed)
Problem: Education: Goal: Knowledge of [facility] [place] [facility] Education information/materials will improve Outcome: Progressing Discussed with patient nasal surgical swab for staph. Discussed CHG bath tonight and in the morning.

## 2015-10-28 NOTE — Plan of Care (Signed)
Problem: Nutrition: Goal: Adequate nutrition will be maintained Outcome: Progressing Patient reports food not tasting good.

## 2015-10-28 NOTE — Progress Notes (Addendum)
Right  Upper Extremity Vein Map    Cephalic  Segment Diameter Depth Comment  1. Axilla mm mm NV  2. Mid upper arm mm mm NV  3. Above AC mm mm   4. In [facility] 2.35mm mm Ceph vein appeared to connect with a branch at above ante and not seen prox.  5. Below AC 2.47mm mm   6. Mid forearm 2.66mm mm   7. Wrist 2.16mm mm    mm mm    Basilic  Segment Diameter Depth Comment  1. Axilla 7.64mm 74mm   2. Mid upper arm 3.20mm 38mm   3. Above [facility] 4.51mm 90mm branch  4. In AC 3.45mm 49mm   5. Below AC 1.81mm 83mm   6. Mid forearm 1.53mm 62mm   7. Wrist mm mm Not visualized   mm mm    Left Upper Extremity Vein Map    Cephalic  Segment Diameter Depth Comment  1. Axilla mm mm Not visualized  2. Mid upper arm mm mm Not visualized  3. Above AC mm mm Not visualized  4. In AC 3.63mm 23mm   5. Below AC 2.27mm 72mm   6. Mid forearm 2.64mm mm   7. Wrist 2.33mm mm    mm mm    Basilic  Segment Diameter Depth Comment  1. Axilla 4.60mm 39mm   2. Mid upper arm 4.28mm 68mm   3. Above AC 4.50mm 1mm   4. In [facility] 3.59mm 38mm branch  5. Below AC 1.87mm mm   6. Mid forearm 1.84mm mm   7. Wrist mm mm    mm mm    mm mm    mm mm

## 2015-10-28 NOTE — Progress Notes (Addendum)
INFECTIOUS DISEASE PROGRESS NOTE  ID: Joseph Villegas is a 25 y.o. male with  Principal Problem:   CKD (chronic kidney disease) Active Problems:   HTN (hypertension)   Acute kidney injury (HCC)   Thrombocytopenia (HCC)   Hematuria   Leukocytosis   Accelerated hypertension   Streptococcal sore throat   Post-streptococcal glomerulonephritis   Essential hypertension   AKI (acute kidney injury) (HCC)   Dyspnea   Hypoxemia   Acute pulmonary edema (HCC)  Subjective: Without complaints.  Denies abd pain.  Ready to go home.   Abtx:  Anti-infectives    Start     Dose/Rate Route Frequency Ordered Stop   10/27/15 1545  ceFEPIme (MAXIPIME) 2 g in dextrose 5 % 50 mL IVPB  Status:  Discontinued     2 g 100 mL/hr over 30 Minutes Intravenous Every T-Th-Sa (Hemodialysis) 10/27/15 1542 10/27/15 1600   10/26/15 1115  vancomycin (VANCOCIN) IVPB 1000 mg/200 mL premix  Status:  Discontinued     1,000 mg 200 mL/hr over 60 Minutes Intravenous  Once 10/26/15 1107 10/27/15 1314   10/25/15 1600  vancomycin (VANCOCIN) IVPB 1000 mg/200 mL premix  Status:  Discontinued     1,000 mg 200 mL/hr over 60 Minutes Intravenous Every T-Th-Sa (Hemodialysis) 10/25/15 1225 10/25/15 1909   10/24/15 1800  ceFEPIme (MAXIPIME) 1 g in dextrose 5 % 50 mL IVPB  Status:  Discontinued     1 g 100 mL/hr over 30 Minutes Intravenous Every 24 hours 10/24/15 1327 10/27/15 1542   10/24/15 1615  vancomycin (VANCOCIN) IVPB 1000 mg/200 mL premix     1,000 mg 200 mL/hr over 60 Minutes Intravenous  Once 10/24/15 1607 10/24/15 1815   10/23/15 1245  vancomycin (VANCOCIN) 1,500 mg in sodium chloride 0.9 % 500 mL IVPB     1,500 mg 250 mL/hr over 120 Minutes Intravenous  Once 10/23/15 1241 10/23/15 2159   10/23/15 1245  ceFEPIme (MAXIPIME) 2 g in dextrose 5 % 50 mL IVPB  Status:  Discontinued     2 g 100 mL/hr over 30 Minutes Intravenous  Once 10/23/15 1241 10/24/15 1327   10/15/15 1115  ceFAZolin (ANCEF) IVPB 2g/100 mL premix       2 g 200 mL/hr over 30 Minutes Intravenous To Radiology 10/15/15 1106 10/16/15 1115   10/15/15 1000  ceFAZolin (ANCEF) IVPB 2g/100 mL premix     2 g 200 mL/hr over 30 Minutes Intravenous  Once 10/15/15 0952 10/15/15 1023   10/15/15 0949  ceFAZolin (ANCEF) 2-4 GM/100ML-% IVPB    Comments:  Duininck, Stacey   : cabinet override      10/15/15 0949 10/15/15 2159   10/08/15 2000  amoxicillin (AMOXIL) capsule 500 mg  Status:  Discontinued     500 mg Oral Every 12 hours 10/08/15 1339 10/14/15 1609   10/08/15 1015  cephALEXin (KEFLEX) capsule 500 mg     500 mg Oral  Once 10/08/15 1008 10/08/15 1053      Medications:  Scheduled: . amLODipine  10 mg Oral Daily  . antiseptic oral rinse  7 mL Mouth Rinse BID  . atorvastatin  20 mg Oral q1800  . carvedilol  25 mg Oral BID WC  . cloNIDine  0.1 mg Oral BID  . famotidine  20 mg Oral QHS  . heparin subcutaneous  5,000 Units Subcutaneous Q8H  . hydrALAZINE  100 mg Oral Q8H  . lanthanum  1,000 mg Oral TID WC  . minoxidil  7.5 mg Oral BID  .  pantoprazole  40 mg Oral Q0600  . [START ON 10/29/2015] predniSONE  50 mg Oral Q breakfast  . sodium chloride flush  10-40 mL Intracatheter Q12H  . spironolactone  25 mg Oral Daily  . verapamil  240 mg Oral Daily    Objective: Vital signs in last 24 hours: Temp:  [98.4 F (36.9 C)-98.6 F (37 C)] 98.6 F (37 C) (07/23 0845) Pulse Rate:  [96-107] 106 (07/23 0845) Resp:  [2-20] 18 (07/23 0845) BP: (151-166)/(73-94) 157/73 (07/23 0845) SpO2:  [95 %-99 %] 99 % (07/23 0845) Weight:  [116.1 kg (256 lb)] 116.1 kg (256 lb) (07/22 2113)   General appearance: alert, cooperative and no distress Resp: clear to auscultation bilaterally Cardio: regular rate and rhythm GI: normal findings: bowel sounds normal and soft, non-tender  Lab Results  Recent Labs  10/26/15 0730  WBC 19.3*  HGB 7.4*  HCT 21.8*  NA 131*  K 4.4  CL 92*  CO2 22  BUN 113*  CREATININE 10.08*   Liver Panel  Recent Labs   10/26/15 0730  PROT 7.2  ALBUMIN 3.1*  AST 14*  ALT 16*  ALKPHOS 36*  BILITOT 0.6   Sedimentation Rate No results for input(s): ESRSEDRATE in the last 72 hours. C-Reactive Protein No results for input(s): CRP in the last 72 hours.  Microbiology: Recent Results (from the past 240 hour(s))  Culture, Urine     Status: None   Collection Time: 10/19/15  5:05 AM  Result Value Ref Range Status   Specimen Description URINE, CLEAN CATCH  Final   Special Requests NONE  Final   Culture NO GROWTH  Final   Report Status 10/20/2015 FINAL  Final  Culture, blood (Routine X 2) w Reflex to ID Panel     Status: None (Preliminary result)   Collection Time: 10/24/15  3:35 PM  Result Value Ref Range Status   Specimen Description BLOOD HEMODIALYSIS CATHETER  Final   Special Requests BOTTLES DRAWN AEROBIC AND ANAEROBIC 10CC  Final   Culture NO GROWTH 3 DAYS  Final   Report Status PENDING  Incomplete  Culture, blood (Routine X 2) w Reflex to ID Panel     Status: None (Preliminary result)   Collection Time: 10/24/15  3:50 PM  Result Value Ref Range Status   Specimen Description BLOOD HEMODIALYSIS CATHETER  Final   Special Requests BOTTLES DRAWN AEROBIC AND ANAEROBIC 10CC  Final   Culture NO GROWTH 3 DAYS  Final   Report Status PENDING  Incomplete  MRSA PCR Screening     Status: None   Collection Time: 10/25/15  8:51 AM  Result Value Ref Range Status   MRSA by PCR NEGATIVE NEGATIVE Final    Comment:        The GeneXpert MRSA Assay (FDA approved for NASAL specimens only), is one component of a comprehensive MRSA colonization surveillance program. It is not intended to diagnose MRSA infection nor to guide or monitor treatment for MRSA infections.     Studies/Results: No results found.   Assessment/Plan: Leukocytosis HCAP Would suggest his WBC is due to recent steroids.  No new lab since 7-21 BCx 7-19 ngtd Watch WBC at his f/u (if not better, repeat CXR, CT. Consider renal u/s to  eval for abscess/hematoma post Bx) Plan on 8 days of cefepime, could change to augmentin at d/c.  Clinically he looks well and would like to go home.   ESRD  HTN  Obesity    Total days of antibiotics: 5  cefepime      Available as needed.    Johny Sax Infectious Diseases (pager) [phone] [email or web address] 10/28/2015, 1:03 PM  LOS: 20 days

## 2015-10-28 NOTE — Progress Notes (Signed)
Patient ID: Joseph Villegas, male   DOB: 1990-09-28, 25 y.o.   MRN: [id number]  [facility] Progress Note   Assessment/ Plan:   1. 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 Because of inability to show improvement of recovering renal clearance, he has been getting hemodialysis intermittently-now scheduled to undergo hemodialysis on a Tuesday/Thursday/Saturday schedule with the process initiated for outpatient dialysis unit placement. Permanent access placement deferred last week because of leukocytosis and no venous mapping having been done---ongoing evaluation by vascular surgery. Unclear if this is prolonged dialysis dependent AKI or progression to ESRD with renal biopsy indicating of some optimism. He resides in [facility] and requests for dialysis unit there for outpatient dialysis. 2. Pneumonia: Chest x-ray indicative of dense right infiltrate for which he was started on treatment for HCAP-clinically appear to be doing better, will follow leukocyte counts (which has likely been elevated by ongoing prednisone use) 3. 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 Hypertension: Blood pressure control better with hemodialysis/compliance with ongoing therapy-continue low-sodium diet.  Subjective:   Refused lab draws yesterday and then again today morning-impressed upon him the importance of these being done and the medical decisions that are dependent on his labs. He expresses great frustration with the duration of his stay here and the inability to discharge him yet.    Objective:   BP (!) 157/73 (BP Location: Right Arm)    Pulse (!) 106   Temp 98.6 F (37 C) (Oral)   Resp 18   Ht 5\' 10"  (1.778 m)   Wt 116.1 kg (256 lb)   SpO2 99%   BMI 36.73 kg/m   Physical Exam: Gen: Comfortably resting in bed, in no distress CVS: Pulse regular rhythm, normal rate, S1 and S2 normal Resp: Fine rales right side, no rhonchi or wheeze Abd: Soft, obese, nontender Ext: No lower extremity edema  Labs: BMET  Recent Labs Lab 10/22/15 0353 10/23/15 0149 10/24/15 0220 10/25/15 0439 10/26/15 0730 10/26/15 0851  NA 129* 132* 132* 132* 131*  --   K 3.2* 3.8 4.8 4.4 4.4  --   CL 92* 97* 97* 96* 92*  --   CO2 21* 21* 23 25 22   --   GLUCOSE 102* 93 121* 135* 130*  --   BUN 128* 89* 70* 68* 113*  --   CREATININE 13.35* 10.58* 8.46* 7.66* 10.08*  --   CALCIUM 8.6* 8.6* 8.8* 9.1 9.6  --   PHOS 8.7* 6.0* 6.3* 6.5*  --  8.7*   CBC  Recent Labs Lab 10/22/15 0353 10/23/15 0149 10/24/15 1303 10/25/15 0323 10/26/15 0730  WBC 13.6* 14.1* 18.8* 21.0* 19.3*  NEUTROABS 11.1*  --   --   --   --   HGB 7.8* 7.3* 7.6* 7.1* 7.4*  HCT 22.7* 22.0* 23.1* 21.7* 21.8*  MCV 82.2 84.6 85.6 85.8 84.8  PLT 357 323 448* 454* 509*   Medications:    . amLODipine  10 mg Oral Daily  . antiseptic oral rinse  7 mL Mouth Rinse BID  . atorvastatin  20 mg Oral q1800  . carvedilol  25 mg Oral BID WC  . cloNIDine  0.1 mg Oral BID  .  famotidine  20 mg Oral QHS  . heparin subcutaneous  5,000 Units Subcutaneous Q8H  . hydrALAZINE  100 mg Oral Q8H  . minoxidil  7.5 mg Oral BID  . pantoprazole  40 mg Oral Q0600  . predniSONE  50 mg Oral Q8H  . sevelamer carbonate  1,600 mg Oral TID WC  . sodium chloride flush  10-40 mL Intracatheter Q12H  . spironolactone  25 mg Oral Daily  . verapamil  240 mg Oral Daily   Zetta Bills, MD 10/28/2015, 8:46 AM

## 2015-10-29 ENCOUNTER — Inpatient Hospital Stay (HOSPITAL_COMMUNITY): Payer: Medicaid Other | Admitting: Certified Registered"

## 2015-10-29 ENCOUNTER — Other Ambulatory Visit: Payer: Self-pay | Admitting: *Deleted

## 2015-10-29 ENCOUNTER — Encounter (HOSPITAL_COMMUNITY): Payer: Self-pay | Admitting: Certified Registered"

## 2015-10-29 ENCOUNTER — Encounter (HOSPITAL_COMMUNITY)
Admission: EM | Disposition: A | Discharge status: Home / Self Care | Payer: Self-pay | Source: Home / Self Care | Attending: Internal Medicine

## 2015-10-29 DIAGNOSIS — N186 End stage renal disease: Secondary | ICD-10-CM

## 2015-10-29 DIAGNOSIS — Z4931 Encounter for adequacy testing for hemodialysis: Secondary | ICD-10-CM

## 2015-10-29 HISTORY — PX: AV FISTULA PLACEMENT: SHX1204

## 2015-10-29 LAB — COMPREHENSIVE METABOLIC PANEL
ALBUMIN: 3.4 g/dL — AB (ref 3.5–5.0)
ALT: 23 U/L (ref 17–63)
ANION GAP: 18 — AB (ref 5–15)
AST: 20 U/L (ref 15–41)
Alkaline Phosphatase: 38 U/L (ref 38–126)
BUN: 145 mg/dL — ABNORMAL HIGH (ref 6–20)
CO2: 20 mmol/L — AB (ref 22–32)
Calcium: 9.4 mg/dL (ref 8.9–10.3)
Chloride: 90 mmol/L — ABNORMAL LOW (ref 101–111)
Creatinine, Ser: 11.53 mg/dL — ABNORMAL HIGH (ref 0.61–1.24)
GFR calc Af Amer: 6 mL/min — ABNORMAL LOW (ref >60)
GFR calc non Af Amer: 5 mL/min — ABNORMAL LOW (ref >60)
GLUCOSE: 104 mg/dL — AB (ref 65–99)
POTASSIUM: 4.3 mmol/L (ref 3.5–5.1)
SODIUM: 128 mmol/L — AB (ref 135–145)
TOTAL PROTEIN: 7.1 g/dL (ref 6.5–8.1)
Total Bilirubin: 0.9 mg/dL (ref 0.3–1.2)

## 2015-10-29 LAB — PROTIME-INR
INR: 1.44 (ref 0.00–1.49)
PROTHROMBIN TIME: 17.6 s — AB (ref 11.6–15.2)

## 2015-10-29 LAB — CBC
HCT: 23.4 % — ABNORMAL LOW (ref 39.0–52.0)
Hemoglobin: 7.9 g/dL — ABNORMAL LOW (ref 13.0–17.0)
MCH: 28.3 pg (ref 26.0–34.0)
MCHC: 33.8 g/dL (ref 30.0–36.0)
MCV: 83.9 fL (ref 78.0–100.0)
PLATELETS: 481 10*3/uL — AB (ref 150–400)
RBC: 2.79 MIL/uL — ABNORMAL LOW (ref 4.22–5.81)
RDW: 13.6 % (ref 11.5–15.5)
WBC: 18.4 10*3/uL — ABNORMAL HIGH (ref 4.0–10.5)

## 2015-10-29 LAB — CULTURE, BLOOD (ROUTINE X 2)
CULTURE: NO GROWTH
Culture: NO GROWTH

## 2015-10-29 SURGERY — ARTERIOVENOUS (AV) FISTULA CREATION
Anesthesia: Monitor Anesthesia Care | Site: Arm Upper | Laterality: Right

## 2015-10-29 MED ORDER — PROPOFOL 500 MG/50ML IV EMUL
INTRAVENOUS | Status: DC | PRN
  Administered 2015-10-29: 50 ug/kg/min via INTRAVENOUS
  Administered 2015-10-29: 12:00:00 via INTRAVENOUS

## 2015-10-29 MED ORDER — PHENYLEPHRINE HCL 10 MG/ML IJ SOLN
INTRAMUSCULAR | Status: DC | PRN
  Administered 2015-10-29: 80 ug via INTRAVENOUS

## 2015-10-29 MED ORDER — PROPOFOL 10 MG/ML IV BOLUS
INTRAVENOUS | Status: AC
  Filled 2015-10-29: qty 20

## 2015-10-29 MED ORDER — FENTANYL CITRATE (PF) 100 MCG/2ML IJ SOLN
INTRAMUSCULAR | Status: AC
  Filled 2015-10-29: qty 2

## 2015-10-29 MED ORDER — MIDAZOLAM HCL 5 MG/5ML IJ SOLN
INTRAMUSCULAR | Status: DC | PRN
  Administered 2015-10-29: 2 mg via INTRAVENOUS

## 2015-10-29 MED ORDER — HEPARIN SODIUM (PORCINE) 1000 UNIT/ML IJ SOLN
INTRAMUSCULAR | Status: DC | PRN
  Administered 2015-10-29: 3000 [IU] via INTRAVENOUS

## 2015-10-29 MED ORDER — DEXTROSE 5 % IV SOLN
INTRAVENOUS | Status: DC | PRN
  Administered 2015-10-29: 1.5 g via INTRAVENOUS

## 2015-10-29 MED ORDER — LACTATED RINGERS IV SOLN: INTRAVENOUS | Status: DC

## 2015-10-29 MED ORDER — FENTANYL CITRATE (PF) 250 MCG/5ML IJ SOLN
INTRAMUSCULAR | Status: DC | PRN
  Administered 2015-10-29: 50 ug via INTRAVENOUS

## 2015-10-29 MED ORDER — SODIUM CHLORIDE 0.9 % IV SOLN
INTRAVENOUS | Status: DC
  Administered 2015-10-29 (×2): via INTRAVENOUS

## 2015-10-29 MED ORDER — PROMETHAZINE HCL 25 MG/ML IJ SOLN: 6.2500 mg | INTRAMUSCULAR | Status: DC | PRN

## 2015-10-29 MED ORDER — LIDOCAINE HCL (PF) 1 % IJ SOLN
INTRAMUSCULAR | Status: DC | PRN
  Administered 2015-10-29: 25 mL

## 2015-10-29 MED ORDER — DEXTROSE 5 % IV SOLN
INTRAVENOUS | Status: AC
  Filled 2015-10-29: qty 1.5

## 2015-10-29 MED ORDER — 0.9 % SODIUM CHLORIDE (POUR BTL) OPTIME
TOPICAL | Status: DC | PRN
Start: 2015-10-29 — End: 2015-10-29
  Administered 2015-10-29: 1000 mL

## 2015-10-29 MED ORDER — MIDAZOLAM HCL 2 MG/2ML IJ SOLN
INTRAMUSCULAR | Status: AC
  Filled 2015-10-29: qty 2

## 2015-10-29 MED ORDER — PROTAMINE SULFATE 10 MG/ML IV SOLN
INTRAVENOUS | Status: DC | PRN
  Administered 2015-10-29 (×3): 10 mg via INTRAVENOUS

## 2015-10-29 MED ORDER — MEPERIDINE HCL 25 MG/ML IJ SOLN: 6.2500 mg | INTRAMUSCULAR | Status: DC | PRN

## 2015-10-29 MED ORDER — SODIUM CHLORIDE 0.9 % IV SOLN
INTRAVENOUS | Status: DC | PRN
  Administered 2015-10-29: 11:00:00

## 2015-10-29 MED ORDER — FENTANYL CITRATE (PF) 100 MCG/2ML IJ SOLN
25.0000 ug | INTRAMUSCULAR | Status: DC | PRN
  Administered 2015-10-29 (×2): 25 ug via INTRAVENOUS

## 2015-10-29 MED ORDER — LIDOCAINE HCL (PF) 1 % IJ SOLN
INTRAMUSCULAR | Status: AC
  Filled 2015-10-29: qty 30

## 2015-10-29 MED ORDER — FENTANYL CITRATE (PF) 250 MCG/5ML IJ SOLN
INTRAMUSCULAR | Status: AC
  Filled 2015-10-29: qty 5

## 2015-10-29 SURGICAL SUPPLY — 32 items
ARMBAND PINK RESTRICT EXTREMIT (MISCELLANEOUS) ×2 IMPLANT
CANISTER SUCTION 2500CC (MISCELLANEOUS) ×2 IMPLANT
CANNULA VESSEL 3MM 2 BLNT TIP (CANNULA) ×4 IMPLANT
CLIP TI MEDIUM 6 (CLIP) ×2 IMPLANT
CLIP TI WIDE RED SMALL 6 (CLIP) ×6 IMPLANT
COVER PROBE W GEL 5X96 (DRAPES) IMPLANT
DECANTER SPIKE VIAL GLASS SM (MISCELLANEOUS) ×2 IMPLANT
DRAIN PENROSE 1/4X12 LTX STRL (WOUND CARE) ×2 IMPLANT
ELECT REM PT RETURN 9FT ADLT (ELECTROSURGICAL) ×2
ELECTRODE REM PT RTRN 9FT ADLT (ELECTROSURGICAL) ×1 IMPLANT
GLOVE BIO SURGEON STRL SZ 6.5 (GLOVE) ×4 IMPLANT
GLOVE BIO SURGEON STRL SZ7.5 (GLOVE) ×2 IMPLANT
GLOVE BIOGEL PI IND STRL 6.5 (GLOVE) ×2 IMPLANT
GLOVE BIOGEL PI INDICATOR 6.5 (GLOVE) ×2
GLOVE ECLIPSE 6.5 STRL STRAW (GLOVE) ×2 IMPLANT
GOWN STRL REUS W/ TWL LRG LVL3 (GOWN DISPOSABLE) ×3 IMPLANT
GOWN STRL REUS W/TWL LRG LVL3 (GOWN DISPOSABLE) ×3
KIT BASIN OR (CUSTOM PROCEDURE TRAY) ×2 IMPLANT
KIT ROOM TURNOVER OR (KITS) ×2 IMPLANT
LIQUID BAND (GAUZE/BANDAGES/DRESSINGS) ×2 IMPLANT
LOOP VESSEL MINI RED (MISCELLANEOUS) IMPLANT
NS IRRIG 1000ML POUR BTL (IV SOLUTION) ×2 IMPLANT
PACK CV ACCESS (CUSTOM PROCEDURE TRAY) ×2 IMPLANT
PAD ARMBOARD 7.5X6 YLW CONV (MISCELLANEOUS) ×4 IMPLANT
SPONGE SURGIFOAM ABS GEL 100 (HEMOSTASIS) IMPLANT
SUT PROLENE 7 0 BV 1 (SUTURE) ×2 IMPLANT
SUT VIC AB 3-0 SH 27 (SUTURE) ×2
SUT VIC AB 3-0 SH 27X BRD (SUTURE) ×2 IMPLANT
SUT VIC AB 4-0 PS2 27 (SUTURE) ×4 IMPLANT
SUT VICRYL 4-0 PS2 18IN ABS (SUTURE) ×2 IMPLANT
UNDERPAD 30X30 INCONTINENT (UNDERPADS AND DIAPERS) ×2 IMPLANT
WATER STERILE IRR 1000ML POUR (IV SOLUTION) ×2 IMPLANT

## 2015-10-29 NOTE — Progress Notes (Signed)
10/29/2015 3:38 PM Hemodialysis Outpatient Note; Mr. Joseph Villegas has been accepted at the [facility] on a Tuesday, Thursday and Saturday schedule. Per the intake coordinator, Mr. Joseph Villegas MUST see Dr. Janit Pagan (patient has seen this physician previously) in his office on Wednesday July 26th at [facility]. [facility] will not begin patient if he does not show for this appointment with Dr. Janit Pagan. Thank you. Tilman Neat

## 2015-10-29 NOTE — Progress Notes (Signed)
[facility] Progress Note   Subjective: back hurts last 24 hrs or so  Vitals:   10/29/15 1230 10/29/15 1240 10/29/15 1247 10/29/15 1317  BP: (!) 110/51 (!) 116/56 118/61 (!) 119/59  Pulse: 89 87 84 90  Resp: (!) 23 15 20 19   Temp:   97.5 F (36.4 C) 97.9 F (36.6 C)  TempSrc:    Oral  SpO2: 92% 93% 94% 97%  Weight:      Height:        Inpatient medications: . amLODipine  10 mg Oral Daily  . antiseptic oral rinse  7 mL Mouth Rinse BID  . atorvastatin  20 mg Oral q1800  . carvedilol  25 mg Oral BID WC  . cloNIDine  0.1 mg Oral BID  . cefUROXime (ZINACEF) 1.5 GM IVPB      . famotidine  20 mg Oral QHS  . fentaNYL      . heparin subcutaneous  5,000 Units Subcutaneous Q8H  . hydrALAZINE  100 mg Oral Q8H  . lanthanum  1,000 mg Oral TID WC  . minoxidil  7.5 mg Oral BID  . pantoprazole  40 mg Oral Q0600  . predniSONE  50 mg Oral Q breakfast  . sodium chloride flush  10-40 mL Intracatheter Q12H  . spironolactone  25 mg Oral Daily  . verapamil  240 mg Oral Daily   . sodium chloride     acetaminophen **OR** acetaminophen, gi cocktail, heparin, hydrALAZINE, HYDROcodone-acetaminophen, levalbuterol, metoprolol, morphine injection, ondansetron **OR** ondansetron (ZOFRAN) IV, polyethylene glycol, sodium chloride flush  Exam: Gen: Comfortably resting in bed, in no distress CVS: Pulse regular rhythm, normal rate, S1 and S2 normal Resp: Fine rales right side, no rhonchi or wheeze Abd: Soft, obese, nontender Ext: No lower extremity edema      Assessment: 1  Acute/ CKD - bx + for TMA due to malig HTN, preserved interstitium. No signs of improvement so planning HD on TTS schedule.  Has AVF placed and catheter.  Wants HD in [facility], lives there.  Has no insurance.  Not ready to dc yet.  2  PNA - improved 3  Anemia started ESA 4  MBD cont binders 5  HTN better control, vol better control  Plan - HD tomorrow, dc norvasc   Vinson Moselle MD [facility] pager [phone]    cell [phone] 10/29/2015, 2:07 PM    Recent Labs Lab 10/24/15 0220 10/25/15 0439 10/26/15 0730 10/26/15 0851 10/29/15 0505  NA 132* 132* 131*  --  128*  K 4.8 4.4 4.4  --  4.3  CL 97* 96* 92*  --  90*  CO2 23 25 22   --  20*  GLUCOSE 121* 135* 130*  --  104*  BUN 70* 68* 113*  --  145*  CREATININE 8.46* 7.66* 10.08*  --  11.53*  CALCIUM 8.8* 9.1 9.6  --  9.4  PHOS 6.3* 6.5*  --  8.7*  --     Recent Labs Lab 10/25/15 0439 10/26/15 0730 10/29/15 0505  AST 16 14* 20  ALT 12* 16* 23  ALKPHOS 33* 36* 38  BILITOT 0.5 0.6 0.9  PROT 7.2 7.2 7.1  ALBUMIN 2.8* 3.1* 3.4*    Recent Labs Lab 10/25/15 0323 10/26/15 0730 10/29/15 0505  WBC 21.0* 19.3* 18.4*  HGB 7.1* 7.4* 7.9*  HCT 21.7* 21.8* 23.4*  MCV 85.8 84.8 83.9  PLT 454* 509* 481*   Iron/TIBC/Ferritin/ %Sat No results found for: IRON, TIBC, FERRITIN, IRONPCTSAT

## 2015-10-29 NOTE — Anesthesia Postprocedure Evaluation (Signed)
Anesthesia Post Note  Patient: Joseph Villegas  Procedure(s) Performed: Procedure(s) (LRB): ARTERIOVENOUS (AV) FISTULA (Right)  Patient location during evaluation: PACU Anesthesia Type: MAC Level of consciousness: awake and alert Pain management: pain level controlled Vital Signs Assessment: post-procedure vital signs reviewed and stable Respiratory status: spontaneous breathing, nonlabored ventilation, respiratory function stable and patient connected to nasal cannula oxygen Cardiovascular status: stable and blood pressure returned to baseline Anesthetic complications: no    Last Vitals:  Vitals:   10/29/15 1247 10/29/15 1317  BP: 118/61 (!) 119/59  Pulse: 84 90  Resp: 20 19  Temp: 36.4 C 36.6 C    Last Pain:  Vitals:   10/29/15 1317  TempSrc: Oral  PainSc:                  Shelton Silvas

## 2015-10-29 NOTE — Op Note (Signed)
Procedure: Right basilic vein transposition fistula first stage, Ultrasound guidance  Preoperative diagnosis: End-stage renal disease  Postoperative diagnosis: Same  Anesthesia: Local with IV sedation  Assistant: Doreatha Massed, PAC  Operative findings: 2.5 mm right basilic vein, 3 mm right brachial artery  Operative details: After obtaining informed consent, the patient was taken to the operating room. The patient was placed in supine position operating table. After adequate sedation, the patient's entire right upper extremity was prepped and draped in the usual sterile fashion. Next ultrasound was used to identify the right basilic vein. A longitudinal incision was made on the ulnar aspect of the forearm after providing local anesthesia in order to expose the basilic vein. The vein was of good quality but only about 2.5 mm in diameter.  Care was taken to try to not injure any sensory nerves and all the motor nerves were identified and protected. The vein was dissected free circumferentially and small side branches ligated and divided between silk ties or clips. An additional incision was made just proximal to this to expose more of the vein.  In this incision it was noted that the vein bifurcated so I followed this branch back toward the antecubital area.  The vein was slightly larger in this direction so I ligated the first branch and elected to use this branch.  Next the brachial artery was exposed by deepening the basilic vein harvest incision just above the antecubital crease. The artery was approximately 3 mm in diameter with some spasm. This was dissected free circumferentially. Vessel loops were placed around it. There was some spasm within the artery after dissecting it free. Next the distal basilic vein was ligated with a 2 silk tie and the vein transected.  The patient was given 3000 units of intravenous heparin. Vessel loops were used to control the artery proximally and distally. A  longitudinal arteriotomy was made with an 11 blade.  The vein was cut to length and sewn end of vein to side of artery using a running 7-0 Prolene suture. Just prior to completion of the anastomosis, it was forebled backbled and thoroughly flushed. The anastomosis was secured and the Vesseloops were released.  There was a palpable thrill in the proximal aspect of the fistula. There was also good Doppler flow throughout the course of the fistula. At this point all subcutaneous tissues were reapproximated using running 3-0 Vicryl suture. All skin incisions were closed with running 4  0 Vicryl subcuticular stitch. Dermabond was applied to all incisions. The patient tolerated the procedure well and there were no complications. Instrument sponge needle counts were correct at the end of the case. The patient was taken to the recovery room in stable condition.  Fabienne Bruns, MD [facility] [place] Office: [phone] Pager: [phone]

## 2015-10-29 NOTE — Anesthesia Preprocedure Evaluation (Addendum)
Anesthesia Evaluation  Patient identified by MRN, date of birth, ID band Patient awake    Reviewed: Allergy & Precautions, NPO status , Patient's Chart, lab work & pertinent test results, reviewed documented beta blocker date and time   Airway Mallampati: II  TM Distance: >3 FB Neck ROM: Full    Dental  (+) Teeth Intact, Dental Advisory Given   Pulmonary    breath sounds clear to auscultation       Cardiovascular hypertension, Pt. on medications and Pt. on home beta blockers  Rhythm:Regular Rate:Normal     Neuro/Psych negative neurological ROS  negative psych ROS   GI/Hepatic negative GI ROS, Neg liver ROS,   Endo/Other  negative endocrine ROS  Renal/GU CRFRenal disease  negative genitourinary   Musculoskeletal negative musculoskeletal ROS (+)   Abdominal (+) + obese,   Peds negative pediatric ROS (+)  Hematology negative hematology ROS (+)   Anesthesia Other Findings Back tenderness, reproducible by touch, non-radiating.   Reproductive/Obstetrics negative OB ROS                            Lab Results  Component Value Date   WBC 18.4 (H) 10/29/2015   HGB 7.9 (L) 10/29/2015   HCT 23.4 (L) 10/29/2015   MCV 83.9 10/29/2015   PLT 481 (H) 10/29/2015   Lab Results  Component Value Date   CREATININE 11.53 (H) 10/29/2015   BUN 145 (H) 10/29/2015   NA 128 (L) 10/29/2015   K 4.3 10/29/2015   CL 90 (L) 10/29/2015   CO2 20 (L) 10/29/2015   Lab Results  Component Value Date   INR 1.44 10/29/2015   INR 1.15 10/14/2015   INR 1.16 10/09/2015     Anesthesia Physical Anesthesia Plan  ASA: III  Anesthesia Plan: MAC   Post-op Pain Management:    Induction: Intravenous  Airway Management Planned: Natural Airway  Additional Equipment:   Intra-op Plan:   Post-operative Plan:   Informed Consent: I have reviewed the patients History and Physical, chart, labs and discussed the  procedure including the risks, benefits and alternatives for the proposed anesthesia with the patient or authorized representative who has indicated his/her understanding and acceptance.     Plan Discussed with: CRNA  Anesthesia Plan Comments:        Anesthesia Quick Evaluation

## 2015-10-29 NOTE — H&P (View-Only) (Signed)
[facility]  Patient name: Joseph Villegas MRN: [id number] DOB: 25-Oct-1990 Sex: male  REASON FOR CONSULT: permanent dialysis access; consult is from Dr. Arrie Aranoladonato.   HPI: Joseph Villegas is a 25 y.o. male, who presents for evaluation of permanent dialysis access. The patient is left handed. He has never had access procedures before. The patient had a right IJ tunneled dialysis catheter placed on 10/15/15 and dialysis was initiated for the first time on 10/16/15.   [facility]15 with one week history of intermittent fever, generalized fatigue and sore throat. He was found to have a positive rapid strep test and placed on amoxicillin. His ED evaluation was also significant for hypertension (204/152), leukocytosis, creatinine of 7.17 and proteinuria. The patient states that he has had hypertension for 5 years. He was not aware of renal issues. He was [facility] evaluation.   His hospital workup revealed severe LVH likely secondary to long standing hypertension. During his hospitalization, he developed acute shortness of breath with increased oxygen requirements requiring BiPAP. His chest CT revealed pneumonia and pulmonary edema. He is currently being treated for HCAP with cefepime and vancomycin. His respiratory status has improved with dialysis. The patient also had chest pain during his admission and cardiology was consulted on 10/18/15. His EKG was significant for LVH. His troponins were elevated but not significant in the setting of renal failure. His EF is normal by ECHO. He remains on a nitro gtt for hypertension.   On ROS, he denies any chest pain or shortness of breath. He is inquiring when he can go home.   Past Medical History  Diagnosis Date  . Hypertension     Family History  Problem Relation Age of Onset  . Hypertension Mother   . Diabetes Mother   . Hypertension Brother   . Diabetes  Sister   . CAD Father     SOCIAL HISTORY: Social History   Social History  . Marital Status: Single    Spouse Name: N/A  . Number of Children: N/A  . Years of Education: N/A   Occupational History  . Not on file.   Social History Main Topics  . Smoking status: Never Smoker   . Smokeless tobacco: Never Used  . Alcohol Use: No  . Drug Use: No  . Sexual Activity: Not Currently   Other Topics Concern  . Not on file   Social History Narrative  . No narrative on file    No Known Allergies  Current Facility-Administered Medications  Medication Dose Route Frequency Provider Last Rate Last Dose  . 0.9 %  sodium chloride infusion   Intravenous Continuous Terrial RhodesJoseph Coladonato, MD 100 mL/hr at 10/23/15 1000    . 0.9 %  sodium chloride infusion  100 mL Intravenous PRN Terrial RhodesJoseph Coladonato, MD      . 0.9 %  sodium chloride infusion  100 mL Intravenous PRN Terrial RhodesJoseph Coladonato, MD      . acetaminophen (TYLENOL) tablet 650 mg  650 mg Oral Q6H PRN Gwenyth BenderKaren M Black, NP   650 mg at 10/23/15 2217   Or  . acetaminophen (TYLENOL) suppository 650 mg  650 mg Rectal Q6H PRN Gwenyth BenderKaren M Black, NP      . amLODipine (NORVASC) tablet 10 mg  10 mg Oral Daily Gwenyth BenderKaren M Black, NP   10 mg at 10/25/15 0915  . antiseptic oral rinse (CPC / CETYLPYRIDINIUM CHLORIDE 0.05%) solution 7 mL  7 mL Mouth Rinse BID Richarda Overlie, MD   7 mL at 10/25/15 0915  . atorvastatin (LIPITOR) tablet 20 mg  20 mg Oral q1800 Richarda Overlie, MD   20 mg at 10/24/15 1736  . carvedilol (COREG) tablet 25 mg  25 mg Oral BID WC Beryle Lathe, MD   25 mg at 10/25/15 0758  . ceFEPIme (MAXIPIME) 1 g in dextrose 5 % 50 mL IVPB  1 g Intravenous Q24H Richarda Overlie, MD   1 g at 10/24/15 1804  . cloNIDine (CATAPRES) tablet 0.1 mg  0.1 mg Oral BID Inez Catalina, MD   0.1 mg at 10/25/15 0915  . famotidine (PEPCID) tablet 20 mg  20 mg Oral QHS Inez Catalina, MD   20 mg at 10/24/15 2125  . furosemide (LASIX) 120 mg in dextrose 5 % 50 mL IVPB  120 mg Intravenous  Q8H Richarda Overlie, MD   120 mg at 10/25/15 1305  . gi cocktail (Maalox,Lidocaine,Donnatal)  30 mL Oral Q6H PRN Dorothea Ogle, MD   30 mL at 10/22/15 2241  . heparin injection 2,300 Units  20 Units/kg Dialysis PRN Terrial Rhodes, MD      . heparin injection 5,000 Units  5,000 Units Subcutaneous Q8H Dorothea Ogle, MD   5,000 Units at 10/25/15 1305  . hydrALAZINE (APRESOLINE) injection 20 mg  20 mg Intravenous Q6H PRN Michael Litter, MD   20 mg at 10/18/15 1456  . hydrALAZINE (APRESOLINE) tablet 100 mg  100 mg Oral Q8H Arita Miss, MD   100 mg at 10/25/15 1306  . HYDROcodone-acetaminophen (NORCO/VICODIN) 5-325 MG per tablet 1-2 tablet  1-2 tablet Oral Q4H PRN Gwenyth Bender, NP   2 tablet at 10/22/15 2049  . levalbuterol (XOPENEX) nebulizer solution 1.25 mg  1.25 mg Nebulization Q3H PRN Rahul P Desai, PA-C      . methylPREDNISolone sodium succinate (SOLU-MEDROL) 125 mg/2 mL injection 60 mg  60 mg Intravenous Q8H Richarda Overlie, MD   60 mg at 10/25/15 1305  . metoprolol (LOPRESSOR) injection 5 mg  5 mg Intravenous Q6H PRN Dorothea Ogle, MD   5 mg at 10/19/15 0230  . minoxidil (LONITEN) tablet 7.5 mg  7.5 mg Oral BID Arita Miss, MD   7.5 mg at 10/25/15 0915  . morphine 2 MG/ML injection 1-2 mg  1-2 mg Intravenous Q4H PRN Dorothea Ogle, MD   2 mg at 10/23/15 0740  . nitroGLYCERIN (NITRODUR - Dosed in mg/24 hr) patch 0.2 mg  0.2 mg Transdermal Once Richarda Overlie, MD   0.2 mg at 10/23/15 1245  . nitroGLYCERIN 50 mg in dextrose 5 % 250 mL (0.2 mg/mL) infusion  0-200 mcg/min Intravenous Titrated Richarda Overlie, MD      . ondansetron (ZOFRAN) tablet 4 mg  4 mg Oral Q6H PRN Gwenyth Bender, NP   4 mg at 10/19/15 2239   Or  . ondansetron (ZOFRAN) injection 4 mg  4 mg Intravenous Q6H PRN Gwenyth Bender, NP   4 mg at 10/22/15 1739  . pantoprazole (PROTONIX) EC tablet 40 mg  40 mg Oral Q0600 Kathlen Mody, MD   40 mg at 10/25/15 1610  . polyethylene glycol (MIRALAX / GLYCOLAX) packet 17 g  17 g Oral Daily PRN Gwenyth Bender, NP      . sevelamer carbonate (RENVELA) tablet 1,600 mg  1,600 mg Oral TID WC Arita Miss, MD   1,600 mg at 10/25/15 1300  . sodium chloride  flush (NS) 0.9 % injection 10-40 mL  10-40 mL Intracatheter Q12H Richarda Overlie, MD   10 mL at 10/25/15 0918  . sodium chloride flush (NS) 0.9 % injection 10-40 mL  10-40 mL Intracatheter PRN Richarda Overlie, MD      . spironolactone (ALDACTONE) tablet 25 mg  25 mg Oral Daily Lars Masson, MD   25 mg at 10/25/15 0915  . vancomycin (VANCOCIN) IVPB 1000 mg/200 mL premix  1,000 mg Intravenous Q T,Th,Sa-HD Emi Holes, RPH      . verapamil (CALAN-SR) CR tablet 240 mg  240 mg Oral Daily Lars Masson, MD   240 mg at 10/25/15 0915    REVIEW OF SYSTEMS:  [X]  denotes positive finding, [ ]  denotes negative finding Cardiac  Comments:  Chest pain or chest pressure:    Shortness of breath upon exertion:    Short of breath when lying flat:    Irregular heart rhythm:        Vascular    Pain in calf, thigh, or hip brought on by ambulation:    Pain in feet at night that wakes you up from your sleep:     Blood clot in your veins:    Leg swelling:         Pulmonary    Oxygen at home:    Productive cough:     Wheezing:         Neurologic    Sudden weakness in arms or legs:     Sudden numbness in arms or legs:     Sudden onset of difficulty speaking or slurred speech:    Temporary loss of vision in one eye:     Problems with dizziness:         Gastrointestinal    Blood in stool:     Vomited blood:         Genitourinary    Burning when urinating:     Blood in urine:        Psychiatric    Major depression:         Hematologic    Bleeding problems:    Problems with blood clotting too easily:        Skin    Rashes or ulcers:        Constitutional    Fever or chills:      PHYSICAL EXAM: Filed Vitals:   10/25/15 0500 10/25/15 0600 10/25/15 0830 10/25/15 1305  BP:  122/60 165/75 150/88  Pulse: 92 88 102   Temp:   98 F  (36.7 C)   TempSrc:   Oral   Resp: 19 16 15    Height:      Weight: 256 lb 9.9 oz (116.4 kg)     SpO2: 100% 99% 91%     GENERAL: The patient is a well-nourished, obese male, in no acute distress. The vital signs are documented above. CARDIAC: There is a regular rate and rhythm. No carotid bruits.  VASCULAR: 2+ radial, ulnar and brachial pulses bilaterally. 3+ dorsalis pedis pulses bilaterally.  PULMONARY: Non labored respiratory effort. Lungs are clear.  MUSCULOSKELETAL: There are no major deformities or cyanosis. NEUROLOGIC: No focal weakness or paresthesias are detected. SKIN: There are no ulcers or rashes noted. PSYCHIATRIC: The patient has a normal affect.  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 He will  be transitioned to a TTS dialysis schedule. The patient is left handed. Vein mapping has been ordered. He is agreeable to access placement. Await vein map results. Remove IV from right arm. Spoke with Dr. Edilia Bo who says there may be room on tomorrow's schedule as a 4th case for right arm fistula versus graft. Given current leukocytosis, may need to delay graft placement if veins are not adequate. Dr. Edilia Bo to evaluate patient.   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  HCAP: on cefepime and vanco Chest pain: resolved. ECHO revealing normal EF, no regional wall motion abnormalities, troponins not significant in setting of renal failure Leukocytosis: 21k, afebrile. Procalcitonin elevated.  Anemia: Hgb 7.1, transfuse per primary.     Joseph Berger, PA-C [facility] [phone]  I have interviewed the patient and examined the patient. I agree with the findings by the PA. Vein map is pending. His white count is 21,000.Unless this  improved significantly he probably will not be ready for surgery Friday.  Cari Caraway, MD [phone]

## 2015-10-29 NOTE — Discharge Instructions (Signed)
° ° °  10/29/2015 Joseph Villegas [id number] 05-24-1990  Surgeon(s): Sherren Kerns, MD  Procedure(s): 1st stage right basilic vein transposition  x Do not stick fistula for 12 weeks

## 2015-10-29 NOTE — Progress Notes (Signed)
PROGRESS NOTE    Joseph Villegas  RUE:[id number] DOB: September 09, 1990 DOA: 10/08/2015 No PCP  Brief Narrative:   Joseph Villegas is a 25 y.o. 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 He was admitted 07/03 with 1wk hx of fever, cough, sore throat, weakness. He had rapid strep + and was started on amoxicillin. He also had hypertensive urgency, outpatient meds resumed (doses adjusted by cards) and clonidine added. Echo revealed severe LVH likely due to long standing HTN.  Over the course of his hospitalization, he has had worsening SOB and increase in weight from 240lbs on admit (07/03) to 261lbs on 07/19.  On 7/18, he had acute worsening of SOB along with increase in O2 needs. He had CT of the chest that revealed dense consolidation R > L along with small pleural effusions bilaterally. Findings most consistent with combo of pneumonia and pulmonary edema. Pt reports he subjectively feels better with BiPAP and after dialysis,    Assessment & Plan:   AKI vs. 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 Because of inability to show improvement of recovering renal clearance, he has been getting hemodialysis intermittently-now scheduled to undergo hemodialysis on a Tuesday/Thursday/Saturday schedule with the process initiated for outpatient dialysis unit placement. Permanent/long-term access also being pursued by [facility]-   [facility] to evaluate for permanent access as he will likely require ongoing dialysis as an outpatient. Lasix has been discontinued  Chuck Hint, MD has been infectious disease leukocytosis likely secondary to steroids,  Discussed with Dr Allena Katz, no need  for CT abdomen pelvis to rule out perinephric hematoma as no fever , if he spikes fever will order renal USG   Status post ARTERIOVENOUS (AV) FISTULA CREATION vs INSERTION OF ARTERIOVENOUS GORTEX GRAFT 7/24 He resides in [facility] and requests for dialysis unit there for outpatient dialysis.   Accelerated HTN with urgency -- On amlodipine, coreg,  , Hydralazine, minoxidil, verapamil, clonidine,  Aldactone On echo he has severe LVH and an intracavitary gradient. He also has a murmur on exam. This is due to his hypertension and LVH, not HCM.     Acute hypoxic respiratory failure - likely multifactorial in the setting of HCAP + volume overload / pulm edema Pro calcitonin 3.36 DDx of pulmonary edema vs HCAP (air bronchograms on CT) vs pulmonary hemorrhage post   right IJ HD cath placement 7/10 > highly doubt so given no hx hemoptysis at all. CT most c/w PNA and vol overload.   patient's respiratory status is slowly improving with dialysis currently requiring 3 L of oxygen at rest. To bronch will require intubation right now, even if pulmonary hemorrhage treatment will be supportive so risk well outweighs the benefit for bronchoscopy. No indication for bronchoscopy per PCCM tx for HCAP with cefepime and vanc , vancomycin discontinued 7/22, ID recommends to continue with cefepime for a total of 8 days and then discontinue  Continue to taper steroids, change prednisone to  50 mg once a day   Chest pain:  In the setting of pulmonary edema, currently on a nitro drip. Although his EKG does have changes, they are consistent with LVH with repolarization abnormality. His echo is reassuring and does not reveal any wall motion abnormalities. Troponin is elevated, but  stable and he has AoCKD.  troponins  remained in the range of 0.8-0.95 , now trending down ECG with LVH. BP improved on Rx continue dialysis will arrange outpatient F/u with Dr Duke Salvia consider myovue in 6-8 weeks as per Wendall Stade, MD  Cardiology has signed off    Anemia-likely anemia of chronic disease Patient had a drop in hemoglobin from 12->8.8 on 7/16, now 7.9, transfuse  with hemodialysis, will defer to nephrology. Patient has been refusing labs for the last 2 days Transfuse if hemoglobin drops further  Strep throat - Treated with amoxicillin, completed  Acute urinary retention with spasms - Patient insistent that foley come out 7/15 -  - Continue morphine PRN pain   Hyponatremia - Stable and mild, likely due to renal failure  Hypokalemia 4.8 today, recheck in the AM  Leukocytosis - Unclear cause, improved initially and now  21.0>19.8, patient now refusing labs   pro-calcitonin 3.36 - Started on treatment for healthcare associated pneumonia   DVT prophylaxis: Heparin Code Status: Full Family Communication: None today Disposition Plan: will need to have outpatient HD arrangements and permanent access placed prior to discharge. Patient  is refusing a.m. labs   Consultants:   Nephrology  IR  Cardiology  Pulmonary  Procedures:  1. Renal biopsy by IR 7/10 2. IR placed R IJ TDC 7/10 3. Echocardiogram.  Antimicrobials:   Amoxicillin, completed course   Vancomycin/cefepime on 7/18-   Subjective: Status post surgery this morning, otherwise stable, on room air    Objective: Vitals:   10/28/15 0459 10/28/15 0845 10/28/15 1736 10/29/15 0514  BP: (!) 166/87 (!) 157/73 (!) 161/75 (!) 150/80  Pulse: (!) 107 (!) 106 100 94  Resp: 18 18 18 20   Temp: 98.5 F (36.9 C) 98.6 F (37 C) 98.1 F (36.7 C) 97.9 F (36.6 C)  TempSrc: Oral Oral Oral Oral  SpO2: 95% 99% 99% 99%  Weight:      Height:        Intake/Output Summary (Last 24 hours) at 10/29/15 1010 Last data filed at 10/29/15 0600  Gross per 24 hour  Intake             1080 ml  Output              225 ml  Net              855 ml   Filed Weights   10/26/15 1050 10/26/15 2128 10/27/15 2113  Weight: 114.3 kg (251  lb 15.8 oz) 113.9 kg (251 lb) 116.1 kg (256 lb)    Examination:  General exam: Appears calm and comfortable  Eyes: EOMI, anicteric sclerae Respiratory system: Respiratory effort normal. No audible wheezing Cardiovascular system: RR, NR. No murmur Gastrointestinal system: Abdomen is nondistended, soft and nontender.  +BS Central nervous system: Alert and oriented. No focal neurological deficits. Skin: No new rashes, lesions or ulcers Psychiatry: Judgement and insight appear normal. Mood & affect appropriate.     Data Reviewed:  CBC:  Recent Labs Lab 10/23/15 0149 10/24/15 1303 10/25/15 0323 10/26/15 0730 10/29/15 0505  WBC 14.1* 18.8* 21.0* 19.3* 18.4*  HGB 7.3* 7.6* 7.1* 7.4* 7.9*  HCT 22.0* 23.1* 21.7* 21.8* 23.4*  MCV 84.6 85.6 85.8 84.8 83.9  PLT 323 448* 454* 509* 481*   Basic Metabolic Panel:  Recent Labs Lab 10/23/15 0149 10/24/15 0220 10/25/15 0439 10/26/15 0730 10/26/15 0851 10/29/15 0505  NA 132* 132* 132* 131*  --  128*  K 3.8 4.8  4.4 4.4  --  4.3  CL 97* 97* 96* 92*  --  90*  CO2 21* --  20*  GLUCOSE 93 121* 135* 130*  --  104*  BUN 89* 70* 68* 113*  --  145*  CREATININE 10.58* 8.46* 7.66* 10.08*  --  11.53*  CALCIUM 8.6* 8.8* 9.1 9.6  --  9.4  PHOS 6.0* 6.3* 6.5*  --  8.7*  --    GFR: Estimated Creatinine Clearance: 12.4 mL/min (by C-G formula based on SCr of 11.53 mg/dL). Liver Function Tests:  Recent Labs Lab 10/23/15 0149 10/24/15 0220 10/25/15 0439 10/26/15 0730 10/29/15 0505  AST 18  --  16 14* 20  ALT 8*  --  12* 16* 23  ALKPHOS 34*  --  33* 36* 38  BILITOT 0.8  --  0.5 0.6 0.9  PROT 6.7  --  7.2 7.2 7.1  ALBUMIN 3.0* 2.7* 2.8* 3.1* 3.4*   No results for input(s): LIPASE, AMYLASE in the last 168 hours. No results for input(s): AMMONIA in the last 168 hours. Coagulation Profile:  Recent Labs Lab 10/29/15 0505  INR 1.44   Cardiac Enzymes:  Recent Labs Lab 10/23/15 0806 10/23/15 1855  TROPONINI 0.30* 0.24*     BNP (last 3 results) No results for input(s): PROBNP in the last 8760 hours. HbA1C: No results for input(s): HGBA1C in the last 72 hours. CBG:  Recent Labs Lab 10/27/15 1130  GLUCAP 160*   Lipid Profile: No results for input(s): CHOL, HDL, LDLCALC, TRIG, CHOLHDL, LDLDIRECT in the last 72 hours.    Radiology Studies: No results found.      Scheduled Meds: . [MAR Hold] amLODipine  10 mg Oral Daily  . [MAR Hold] antiseptic oral rinse  7 mL Mouth Rinse BID  . [MAR Hold] atorvastatin  20 mg Oral q1800  . [MAR Hold] carvedilol  25 mg Oral BID WC  . [MAR Hold] cloNIDine  0.1 mg Oral BID  . cefUROXime (ZINACEF) 1.5 GM IVPB      . [MAR Hold] famotidine  20 mg Oral QHS  . [MAR Hold] heparin subcutaneous  5,000 Units Subcutaneous Q8H  . [MAR Hold] hydrALAZINE  100 mg Oral Q8H  . [MAR Hold] lanthanum  1,000 mg Oral TID WC  . [MAR Hold] minoxidil  7.5 mg Oral BID  . [MAR Hold] pantoprazole  40 mg Oral Q0600  . [MAR Hold] predniSONE  50 mg Oral Q breakfast  . [MAR Hold] sodium chloride flush  10-40 mL Intracatheter Q12H  . [MAR Hold] spironolactone  25 mg Oral Daily  . [MAR Hold] verapamil  240 mg Oral Daily   Continuous Infusions: . sodium chloride       LOS: 21 days    Time spent: 25 minutes    Richarda Overlie, MD [facility] Pager [phone]  If 7PM-7AM, please contact night-coverage [email or web address] Password [email or web address] 10/29/2015, 10:10 AM

## 2015-10-29 NOTE — Progress Notes (Signed)
Received pt from OR. Pt has a RUA AVF that is positive for thrill and Bruit. Pt placed back on telelmetry.

## 2015-10-29 NOTE — Interval H&P Note (Signed)
History and Physical Interval Note:  10/29/2015 9:32 AM  Joseph Villegas  has presented today for surgery, with the diagnosis of ESRD  The various methods of treatment have been discussed with the patient and family. After consideration of risks, benefits and other options for treatment, the patient has consented to  Procedure(s): ARTERIOVENOUS (AV) FISTULA CREATION vs INSERTION OF ARTERIOVENOUS GORTEX GRAFT (Right) as a surgical intervention .  The patient's history has been reviewed, patient examined, no change in status, stable for surgery.  I have reviewed the patient's chart and labs.  Vein map reviewed plan for right basilic vein vs graft.  Pt having some right lower back pain today.  Will treat with pain meds if continues may need further eval.  Questions were answered to the patient's satisfaction.     Fabienne Bruns

## 2015-10-29 NOTE — Transfer of Care (Signed)
Immediate Anesthesia Transfer of Care Note  Patient: Luckas Porro  Procedure(s) Performed: Procedure(s): ARTERIOVENOUS (AV) FISTULA (Right)  Patient Location: PACU  Anesthesia Type:MAC  Level of Consciousness: sedated and responds to stimulation  Airway & Oxygen Therapy: Patient Spontanous Breathing and Patient connected to face mask oxygen  Post-op Assessment: Report given to RN, Post -op Vital signs reviewed and stable and Patient moving all extremities  Post vital signs: Reviewed and stable  Last Vitals:  Vitals:   10/28/15 1736 10/29/15 0514  BP: (!) 161/75 (!) 150/80  Pulse: 100 94  Resp: 18 20  Temp: 36.7 C 36.6 C    Last Pain:  Vitals:   10/29/15 0700  TempSrc:   PainSc: 8       Patients Stated Pain Goal: 0 (10/21/15 2135)  Complications: No apparent anesthesia complications

## 2015-10-30 ENCOUNTER — Encounter (HOSPITAL_COMMUNITY): Payer: Self-pay | Admitting: Vascular Surgery

## 2015-10-30 LAB — CBC
HCT: 23.8 % — ABNORMAL LOW (ref 39.0–52.0)
Hemoglobin: 8.2 g/dL — ABNORMAL LOW (ref 13.0–17.0)
MCH: 28.5 pg (ref 26.0–34.0)
MCHC: 34.5 g/dL (ref 30.0–36.0)
MCV: 82.6 fL (ref 78.0–100.0)
PLATELETS: 433 10*3/uL — AB (ref 150–400)
RBC: 2.88 MIL/uL — ABNORMAL LOW (ref 4.22–5.81)
RDW: 13.8 % (ref 11.5–15.5)
WBC: 19.1 10*3/uL — AB (ref 4.0–10.5)

## 2015-10-30 LAB — RENAL FUNCTION PANEL
ALBUMIN: 3.3 g/dL — AB (ref 3.5–5.0)
Anion gap: 19 — ABNORMAL HIGH (ref 5–15)
BUN: 155 mg/dL — AB (ref 6–20)
CHLORIDE: 90 mmol/L — AB (ref 101–111)
CO2: 19 mmol/L — ABNORMAL LOW (ref 22–32)
CREATININE: 12.2 mg/dL — AB (ref 0.61–1.24)
Calcium: 9 mg/dL (ref 8.9–10.3)
GFR calc Af Amer: 6 mL/min — ABNORMAL LOW (ref >60)
GFR, EST NON AFRICAN AMERICAN: 5 mL/min — AB (ref >60)
GLUCOSE: 82 mg/dL (ref 65–99)
Phosphorus: 10.9 mg/dL — ABNORMAL HIGH (ref 2.5–4.6)
Potassium: 3.8 mmol/L (ref 3.5–5.1)
Sodium: 128 mmol/L — ABNORMAL LOW (ref 135–145)

## 2015-10-30 MED ORDER — HYDRALAZINE HCL 100 MG PO TABS: 100.0000 mg | ORAL_TABLET | Freq: Three times a day (TID) | ORAL | 1 refills | Status: DC

## 2015-10-30 MED ORDER — VERAPAMIL HCL ER 240 MG PO TBCR: 240.0000 mg | EXTENDED_RELEASE_TABLET | Freq: Every day | ORAL | 2 refills | Status: DC

## 2015-10-30 MED ORDER — MINOXIDIL 2.5 MG PO TABS: 7.5000 mg | ORAL_TABLET | Freq: Two times a day (BID) | ORAL | 1 refills | Status: DC

## 2015-10-30 MED ORDER — PANTOPRAZOLE SODIUM 40 MG PO TBEC: 40.0000 mg | DELAYED_RELEASE_TABLET | Freq: Every day | ORAL | 2 refills | Status: DC

## 2015-10-30 MED ORDER — AMLODIPINE BESYLATE 10 MG PO TABS: 10.0000 mg | ORAL_TABLET | Freq: Every day | ORAL | 1 refills | Status: DC

## 2015-10-30 MED ORDER — CARVEDILOL 25 MG PO TABS: 25.0000 mg | ORAL_TABLET | Freq: Two times a day (BID) | ORAL | 1 refills | Status: DC

## 2015-10-30 MED ORDER — METOPROLOL TARTRATE 25 MG PO TABS: 25.0000 mg | ORAL_TABLET | Freq: Two times a day (BID) | ORAL | 1 refills | Status: DC

## 2015-10-30 MED ORDER — ATORVASTATIN CALCIUM 20 MG PO TABS: 20.0000 mg | ORAL_TABLET | Freq: Every day | ORAL | 1 refills | Status: DC

## 2015-10-30 MED ORDER — SPIRONOLACTONE 25 MG PO TABS: 25.0000 mg | ORAL_TABLET | Freq: Every day | ORAL | 2 refills | Status: AC

## 2015-10-30 MED ORDER — LANTHANUM CARBONATE 1000 MG PO CHEW: 1000.0000 mg | CHEWABLE_TABLET | Freq: Three times a day (TID) | ORAL | 1 refills | Status: DC

## 2015-10-30 MED ORDER — SPIRONOLACTONE 25 MG PO TABS: 25.0000 mg | ORAL_TABLET | Freq: Every day | ORAL | 2 refills | Status: DC

## 2015-10-30 MED ORDER — CLONIDINE HCL 0.1 MG PO TABS: 0.1000 mg | ORAL_TABLET | Freq: Every day | ORAL | 11 refills | Status: DC

## 2015-10-30 NOTE — Progress Notes (Signed)
  [facility] Progress Note   Subjective: going home, accepted at [facility] HD on TTS sched  Vitals:   10/30/15 0930 10/30/15 1000 10/30/15 1030 10/30/15 1051  BP: (!) 157/83 (!) 142/74 (!) 149/80 (!) 158/74  Pulse: (!) 104 (!) 106 (!) 106 (!) 108  Resp:    (!) 22  Temp:    97.8 F (36.6 C)  TempSrc:    Oral  SpO2:    96%  Weight:    111.9 kg (246 lb 11.1 oz)  Height:        Inpatient medications: . amLODipine  10 mg Oral Daily  . antiseptic oral rinse  7 mL Mouth Rinse BID  . atorvastatin  20 mg Oral q1800  . carvedilol  25 mg Oral BID WC  . cloNIDine  0.1 mg Oral BID  . famotidine  20 mg Oral QHS  . heparin subcutaneous  5,000 Units Subcutaneous Q8H  . hydrALAZINE  100 mg Oral Q8H  . lanthanum  1,000 mg Oral TID WC  . minoxidil  7.5 mg Oral BID  . pantoprazole  40 mg Oral Q0600  . predniSONE  50 mg Oral Q breakfast  . sodium chloride flush  10-40 mL Intracatheter Q12H  . spironolactone  25 mg Oral Daily  . verapamil  240 mg Oral Daily   . sodium chloride     acetaminophen **OR** acetaminophen, gi cocktail, heparin, hydrALAZINE, HYDROcodone-acetaminophen, levalbuterol, metoprolol, morphine injection, ondansetron **OR** ondansetron (ZOFRAN) IV, polyethylene glycol, sodium chloride flush  Exam: Gen: Comfortably resting in bed, in no distress CVS: Pulse regular rhythm, normal rate, S1 and S2 normal Resp: Fine rales right side, no rhonchi or wheeze Abd: Soft, obese, nontender Ext: No lower extremity edema      Assessment: 1  Acute/ CKD - bx + for TMA due to malig HTN, preserved interstitium. No signs of improvement so started on HD here. For dc today, accepted at [facility] HD unit on TTS schedule 2  PNA - improved 3  Anemia started ESA 4  MBD cont binders 5  HTN better control, vol better control  Plan - for dc today   Joseph Moselle MD [facility] pager [phone]    cell [phone] 10/30/2015, 2:01 PM    Recent Labs Lab  10/25/15 0439 10/26/15 0730 10/26/15 0851 10/29/15 0505 10/30/15 0728  NA 132* 131*  --  128* 128*  K 4.4 4.4  --  4.3 3.8  CL 96* 92*  --  90* 90*  CO2 25 22  --  20* 19*  GLUCOSE 135* 130*  --  104* 82  BUN 68* 113*  --  145* 155*  CREATININE 7.66* 10.08*  --  11.53* 12.20*  CALCIUM 9.1 9.6  --  9.4 9.0  PHOS 6.5*  --  8.7*  --  10.9*    Recent Labs Lab 10/25/15 0439 10/26/15 0730 10/29/15 0505 10/30/15 0728  AST 16 14* 20  --   ALT 12* 16* 23  --   ALKPHOS 33* 36* 38  --   BILITOT 0.5 0.6 0.9  --   PROT 7.2 7.2 7.1  --   ALBUMIN 2.8* 3.1* 3.4* 3.3*    Recent Labs Lab 10/26/15 0730 10/29/15 0505 10/30/15 0728  WBC 19.3* 18.4* 19.1*  HGB 7.4* 7.9* 8.2*  HCT 21.8* 23.4* 23.8*  MCV 84.8 83.9 82.6  PLT 509* 481* 433*   Iron/TIBC/Ferritin/ %Sat No results found for: IRON, TIBC, FERRITIN, IRONPCTSAT

## 2015-10-30 NOTE — Progress Notes (Addendum)
[facility]  Subjective  - Doing well no pain in the right hand.   Objective (!) 156/79 (!) 101 97.9 F (36.6 C) (Oral) 19 96%  Intake/Output Summary (Last 24 hours) at 10/30/15 0853 Last data filed at 10/30/15 0451  Gross per 24 hour  Intake              980 ml  Output              720 ml  Net              260 ml   Palpable radial pulse, active range of motion intact Incision clean and dry   Assessment/Planning: POD #1 Right basilic vein transposition fistula first stage F/U in 6 weeks for fistula duplex and planning of second stage basilic transposition.   Clinton Gallant [facility] 10/30/2015 8:53 AM -- + thrill no steal symptoms Plan for second stage discussed with pt Follow up 6 weeks  Fabienne Bruns, MD [facility] [place] Office: [phone] Pager: [phone]  Laboratory Lab Results:  Recent Labs  10/29/15 0505 10/30/15 0728  WBC 18.4* 19.1*  HGB 7.9* 8.2*  HCT 23.4* 23.8*  PLT 481* 433*   BMET  Recent Labs  10/29/15 0505 10/30/15 0728  NA 128* 128*  K 4.3 3.8  CL 90* 90*  CO2 20* 19*  GLUCOSE 104* 82  BUN 145* 155*  CREATININE 11.53* 12.20*  CALCIUM 9.4 9.0    COAG Lab Results  Component Value Date   INR 1.44 10/29/2015   INR 1.15 10/14/2015   INR 1.16 10/09/2015   No results found for: PTT

## 2015-10-30 NOTE — Discharge Summary (Signed)
Physician Discharge Summary  Joseph Villegas MRN: [id number] DOB/AGE: [birth year] 25 y.o.  PCP: No PCP Per Patient   Admit date: 10/08/2015 Discharge date: 10/30/2015  Discharge Diagnoses:    Principal Problem:   CKD (chronic kidney disease) Active Problems:   HTN (hypertension)   Acute kidney injury ([place])   Thrombocytopenia ([facility])   Hematuria   Leukocytosis   Accelerated hypertension   Streptococcal sore throat   Post-streptococcal glomerulonephritis   Essential hypertension   AKI (acute kidney injury) ([place])   Dyspnea   Hypoxemia   Acute pulmonary edema ([place])    Follow-up recommendations Follow-up with PCP in 3-5 days , including all  additional recommended appointments as below Follow-up CBC, CMP in 3-5 days  Mr. Joseph Villegas has been accepted at the [facility] on a Tuesday, Thursday and Saturday schedule. Per the intake coordinator, Mr. Joseph Villegas MUST see Dr. Neta Villegas (patient has seen this physician previously) in his office on Wednesday July 26th at [facility]     Current Discharge Medication List    START taking these medications   Details  atorvastatin (LIPITOR) 20 MG tablet Take 1 tablet (20 mg total) by mouth daily at 6 PM. Qty: 30 tablet, Refills: 1    carvedilol (COREG) 25 MG tablet Take 1 tablet (25 mg total) by mouth 2 (two) times daily with a meal. Qty: 60 tablet, Refills: 1    hydrALAZINE (APRESOLINE) 100 MG tablet Take 1 tablet (100 mg total) by mouth every 8 (eight) hours. Qty: 90 tablet, Refills: 1    lanthanum (FOSRENOL) 1000 MG chewable tablet Chew 1 tablet (1,000 mg total) by mouth 3 (three) times daily with meals. Qty: 90 tablet, Refills: 1    minoxidil (LONITEN) 2.5 MG tablet Take 3 tablets (7.5 mg total) by mouth 2 (two) times daily. Qty: 60 tablet, Refills: 1    pantoprazole (PROTONIX) 40 MG tablet Take 1 tablet (40 mg total) by mouth daily at 6 (six) AM. Qty: 30 tablet, Refills: 2    spironolactone (ALDACTONE) 25 MG tablet Take 1 tablet  (25 mg total) by mouth daily. Qty: 30 tablet, Refills: 2    verapamil (CALAN-SR) 240 MG CR tablet Take 1 tablet (240 mg total) by mouth daily. Qty: 30 tablet, Refills: 2      CONTINUE these medications which have NOT CHANGED   Details  amLODipine (NORVASC) 10 MG tablet Take 10 mg by mouth daily.    aspirin EC 325 MG tablet Take 325 mg by mouth daily as needed (for headaches).    cloNIDine (CATAPRES) 0.1 MG tablet Take 0.1 mg by mouth daily.    metoprolol tartrate (LOPRESSOR) 25 MG tablet Take 25 mg by mouth 2 (two) times daily.      STOP taking these medications     furosemide (LASIX) 20 MG tablet          Discharge Condition: Stable    Discharge Instructions Get Medicines reviewed and adjusted: Please take all your medications with you for your next visit with your Primary MD  Please request your Primary MD to go over all hospital tests and procedure/radiological results at the follow up, please ask your Primary MD to get all Hospital records sent to his/her office.  If you experience worsening of your admission symptoms, develop shortness of breath, life threatening emergency, suicidal or homicidal thoughts you must seek medical attention immediately by calling 911 or calling your MD immediately if symptoms less severe.  You must read complete instructions/literature along with all the possible  adverse reactions/side effects for all the Medicines you take and that have been prescribed to you. Take any new Medicines after you have completely understood and accpet all the possible adverse reactions/side effects.   Do not drive when taking Pain medications.   Do not take more than prescribed Pain, Sleep and Anxiety Medications  Special Instructions: If you have smoked or chewed Tobacco in the last 2 yrs please stop smoking, stop any regular Alcohol and or any Recreational drug use.  Wear Seat belts while driving.  Please note  You were cared for by a hospitalist  during your hospital stay. Once you are discharged, your primary care physician will handle any further medical issues. Please note that NO REFILLS for any discharge medications will be authorized once you are discharged, as it is imperative that you return to your primary care physician (or establish a relationship with a primary care physician if you do not have one) for your aftercare needs so that they can reassess your need for medications and monitor your lab values.  Discharge Instructions    Diet - low sodium heart healthy    Complete by:  As directed   Increase activity slowly    Complete by:  As directed       Allergies  Allergen Reactions  . No Known Allergies       Disposition: Final discharge disposition not confirmed   Consults:  Nephrology Vascular surgery Critical care Cardiology     Significant Diagnostic Studies:  Dg Chest 2 View  Result Date: 10/25/2015 CLINICAL DATA:  Dyspnea /sob  For few days EXAM: CHEST  2 VIEW COMPARISON:  10/23/2015 FINDINGS: Right-sided dialysis catheter tip overlies the level of the upper right atrium. Heart size is accentuated by shallow lung inflation and appears mildly enlarged. Significant right pulmonary opacities are again noted the there has been some improvement in aeration. There is no pneumothorax. There is minimal opacity at the left lung base. IMPRESSION: Bilateral pulmonary infiltrates, slightly improved on the right. Electronically Signed   By: Nolon Nations M.D.   On: 10/25/2015 13:06   Dg Chest 2 View  Result Date: 10/23/2015 CLINICAL DATA:  Patient with persistent shortness of breath since dialysis. Mid sternal chest pain. EXAM: CHEST  2 VIEW COMPARISON:  10/22/2015 chest radiograph FINDINGS: Central venous catheter tip projects over the superior vena cava. Monitoring leads overlie the patient. Stable cardiomegaly. Significant interval worsening of diffuse consolidation throughout the right lung. Left lung is clear.  Small right pleural effusion. Regional skeleton is unremarkable. IMPRESSION: Significant worsening diffuse airspace opacification of the right hemi thorax concerning for pneumonia or potentially asymmetric edema. Continued radiographic followup to ensure resolution is recommended. Small right pleural effusion. Electronically Signed   By: Lovey Newcomer M.D.   On: 10/23/2015 09:40   Dg Chest 2 View  Result Date: 10/22/2015 CLINICAL DATA:  Fever, cough. EXAM: CHEST  2 VIEW COMPARISON:  None. FINDINGS: The heart size and mediastinal contours are within normal limits. Left lung is clear. No pneumothorax or significant pleural effusion is noted. Right internal jugular dialysis catheter is noted with distal tip in expected position of the SVC. Right perihilar and basilar opacity is noted concerning for edema or possibly pneumonia. The visualized skeletal structures are unremarkable. IMPRESSION: Right perihilar and basilar opacity concerning for edema or pneumonia. Follow-up radiographs are recommended. Electronically Signed   By: Marijo Conception, M.D.   On: 10/22/2015 15:37   Ct Chest Wo Contrast  Result Date: 10/23/2015  CLINICAL DATA:  Extreme shortness of breath. History of hypertension, chronic kidney disease. EXAM: CT CHEST WITHOUT CONTRAST TECHNIQUE: Multidetector CT imaging of the chest was performed following the standard protocol without IV contrast. COMPARISON:  Chest radiograph October 23, 2015 at 0840 hours FINDINGS: Cardiovascular: Heart size is normal. Trace pericardial effusion. Thoracic aorta is normal course and caliber. Mediastinum/Nodes: Limited assessment for lymphadenopathy by noncontrast examination. Minimal residual thymic tissue. Tunneled dialysis catheter via RIGHT internal jugular venous approach with distal tip at cavoatrial junction. Lungs/Pleura: Dense consolidation and ground-glass opacities predominately in RIGHT lung, to a lesser extent LEFT lower lobe with small bilateral pleural  effusions. RIGHT lower lobe with air bronchograms. Tracheobronchial tree is patent and midline. Upper Abdomen: Dense LEFT subcapsular renal hematoma measuring up to 3 cm in greatest depth, with small amount of perinephric dense fat stranding. Musculoskeletal: Normal. IMPRESSION: Dense consolidation RIGHT greater than LEFT lungs compatible with pneumonia and/or pulmonary edema. Small pleural effusions. LEFT renal subcapsular hematoma and small amount of LEFT retroperitoneal hemorrhage partially imaged. These results will be called to the ordering clinician or representative by the Radiologist Assistant, and communication documented in the PACS or zVision Dashboard. Electronically Signed   By: Elon Alas M.D.   On: 10/23/2015 12:11   US Renal  Result Date: 10/08/2015 CLINICAL DATA:  Acute renal failure. Hematuria today. Weakness, fever and fatigue. Strep pharyngitis for the past week. EXAM: RENAL / URINARY TRACT ULTRASOUND COMPLETE COMPARISON:  06/14/2012. FINDINGS: Right Kidney: Length: 11.1 cm. Markedly echogenic with marked progression. No hydronephrosis, mass or calculi seen. Left Kidney: Length: 11.1 cm. Markedly echogenic with marked progression. Poorly visualized collecting system without gross hydronephrosis. No mass or calculi seen. Bladder: Appears normal for degree of bladder distention. Additional finding:  Small right pleural effusion. IMPRESSION: 1. Interval markedly increased echogenicity of both kidneys compatible with severe medical renal disease. 2. No definite hydronephrosis. 3. Small right pleural effusion. Electronically Signed   By: Claudie Revering M.D.   On: 10/08/2015 16:56   Ir Fluoro Guide Cv Line Right  Result Date: 10/15/2015 CLINICAL DATA:  Nephrotic syndrome and renal failure requiring hemodialysis. EXAM: TUNNELED CENTRAL VENOUS HEMODIALYSIS CATHETER PLACEMENT WITH ULTRASOUND AND FLUOROSCOPIC GUIDANCE ANESTHESIA/SEDATION: 1.0 mg IV Versed; 50 mcg IV Fentanyl. Total Moderate  Sedation Time 30 minutes. The patient's level of consciousness and physiologic status were continuously monitored during the procedure by Radiology nursing. MEDICATIONS: 2 g IV Ancef. As antibiotic prophylaxis, Ancef was ordered pre-procedure and administered intravenously within one hour of incision. FLUOROSCOPY TIME:  42 seconds. PROCEDURE: The procedure, risks, benefits, and alternatives were explained to the patient. Questions regarding the procedure were encouraged and answered. The patient understands and consents to the procedure. A time-out was performed prior to initiating the procedure. Ultrasound was used to confirm patency of the right internal jugular vein. The right neck and chest were prepped with chlorhexidine in a sterile fashion, and a sterile drape was applied covering the operative field. Maximum barrier sterile technique with sterile gowns and gloves were used for the procedure. Local anesthesia was provided with 1% lidocaine. After creating a small venotomy incision, a 21 gauge needle was advanced into the right internal jugular vein under direct, real-time ultrasound guidance. Ultrasound image documentation was performed. After securing guidewire access, an 8 Fr dilator was placed. A J-wire was kinked to measure appropriate catheter length. A Palindrome tunneled hemodialysis catheter measuring 19 cm from tip to cuff was chosen for placement. This was tunneled in a retrograde fashion from the  chest wall to the venotomy incision. At the venotomy, serial dilatation was performed and a 16 Fr peel-away sheath was placed over a guidewire. The catheter was then placed through the sheath and the sheath removed. Final catheter positioning was confirmed and documented with a fluoroscopic spot image. The catheter was aspirated, flushed with saline, and injected with appropriate volume heparin dwells. The venotomy incision was closed with subcutaneous 3-0 Monocryl and subcuticular 4-0 Vicryl. Dermabond  was applied to the incision. The catheter exit site was secured with 0-Prolene retention sutures. COMPLICATIONS: None.  No pneumothorax. FINDINGS: After catheter placement, the tip lies in the right atrium. The catheter aspirates normally and is ready for immediate use. IMPRESSION: Placement of tunneled hemodialysis catheter via the right internal jugular vein. The catheter tip lies in the right atrium. The catheter is ready for immediate use. Electronically Signed   By: Aletta Edouard M.D.   On: 10/15/2015 11:23   Ir US Guide Vasc Access Left  Result Date: 10/15/2015 CLINICAL DATA:  Nephrotic syndrome and renal failure requiring hemodialysis. EXAM: TUNNELED CENTRAL VENOUS HEMODIALYSIS CATHETER PLACEMENT WITH ULTRASOUND AND FLUOROSCOPIC GUIDANCE ANESTHESIA/SEDATION: 1.0 mg IV Versed; 50 mcg IV Fentanyl. Total Moderate Sedation Time 30 minutes. The patient's level of consciousness and physiologic status were continuously monitored during the procedure by Radiology nursing. MEDICATIONS: 2 g IV Ancef. As antibiotic prophylaxis, Ancef was ordered pre-procedure and administered intravenously within one hour of incision. FLUOROSCOPY TIME:  42 seconds. PROCEDURE: The procedure, risks, benefits, and alternatives were explained to the patient. Questions regarding the procedure were encouraged and answered. The patient understands and consents to the procedure. A time-out was performed prior to initiating the procedure. Ultrasound was used to confirm patency of the right internal jugular vein. The right neck and chest were prepped with chlorhexidine in a sterile fashion, and a sterile drape was applied covering the operative field. Maximum barrier sterile technique with sterile gowns and gloves were used for the procedure. Local anesthesia was provided with 1% lidocaine. After creating a small venotomy incision, a 21 gauge needle was advanced into the right internal jugular vein under direct, real-time ultrasound  guidance. Ultrasound image documentation was performed. After securing guidewire access, an 8 Fr dilator was placed. A J-wire was kinked to measure appropriate catheter length. A Palindrome tunneled hemodialysis catheter measuring 19 cm from tip to cuff was chosen for placement. This was tunneled in a retrograde fashion from the chest wall to the venotomy incision. At the venotomy, serial dilatation was performed and a 16 Fr peel-away sheath was placed over a guidewire. The catheter was then placed through the sheath and the sheath removed. Final catheter positioning was confirmed and documented with a fluoroscopic spot image. The catheter was aspirated, flushed with saline, and injected with appropriate volume heparin dwells. The venotomy incision was closed with subcutaneous 3-0 Monocryl and subcuticular 4-0 Vicryl. Dermabond was applied to the incision. The catheter exit site was secured with 0-Prolene retention sutures. COMPLICATIONS: None.  No pneumothorax. FINDINGS: After catheter placement, the tip lies in the right atrium. The catheter aspirates normally and is ready for immediate use. IMPRESSION: Placement of tunneled hemodialysis catheter via the right internal jugular vein. The catheter tip lies in the right atrium. The catheter is ready for immediate use. Electronically Signed   By: Aletta Edouard M.D.   On: 10/15/2015 11:23   Ir US Guide Bx Asp/drain  Result Date: 10/15/2015 CLINICAL DATA:  Nephrotic syndrome, malignant hypertension and renal failure. The patient requires renal biopsy.  EXAM: ULTRASOUND GUIDED CORE BIOPSY OF LEFT KIDNEY MEDICATIONS: 1.0 mg IV Versed; 50 mcg IV Fentanyl Total Moderate Sedation Time: 30 minutes. The patient's level of consciousness and physiologic status were continuously monitored during the procedure by Radiology nursing. PROCEDURE: The procedure, risks, benefits, and alternatives were explained to the patient. Questions regarding the procedure were encouraged and  answered. The patient understands and consents to the procedure. A time-out was performed prior to the procedure. The left flank region was prepped with chlorhexidine in a sterile fashion, and a sterile drape was applied covering the operative field. A sterile gown and sterile gloves were used for the procedure. Local anesthesia was provided with 1% Lidocaine. Ultrasound was performed of both kidneys in a prone position. The left kidney was chosen for biopsy. Two passes were made at the level of lower pole cortex with a 16 gauge core biopsy device. Core samples were submitted in saline. Post biopsy imaging was performed by ultrasound. COMPLICATIONS: None. FINDINGS: The kidneys are very echogenic bilaterally. The left kidney was better visualized by ultrasound for biopsy purposes compared to the right. Solid tissue samples were obtained. IMPRESSION: Ultrasound-guided random core biopsy performed through lower pole cortex of the left kidney. Electronically Signed   By: Aletta Edouard M.D.   On: 10/15/2015 11:28   Dg Chest Port 1 View  Result Date: 10/23/2015 CLINICAL DATA:  Shortness of breath EXAM: PORTABLE CHEST 1 VIEW COMPARISON:  October 23, 2015 FINDINGS: A right-sided double lumen catheter is identified. The opacity in the right lung has worsened in the interval with increasing density and suggestion of early air bronchograms. The left lung remains clear. No other acute abnormalities. IMPRESSION: Worsening infiltrate in the right lung. Recommend follow-up to resolution. Electronically Signed   By: Dorise Bullion III M.D   On: 10/23/2015 20:34    2-D echo LV EF: 65% -   70%  ------------------------------------------------------------------- History:   PMH:  Elevated Troponin. Obesity.  Risk factors: Hypertension.  ------------------------------------------------------------------- Study Conclusions  - Procedure narrative: Transthoracic echocardiography. Image   quality was adequate. The study  was technically difficult. - Left ventricle: The cavity size was normal. Wall thickness was   increased in a pattern of severe LVH. Systolic function was   vigorous. The estimated ejection fraction was in the range of 65%   to 70%. Wall motion was normal; there were no regional wall   motion abnormalities. There was fusion of early and atrial   contributions to ventricular filling. Doppler parameters are   consistent with abnormal left ventricular relaxation (grade 1   diastolic dysfunction). - Left atrium: The atrium was moderately dilated.   Filed Weights   10/27/15 2113 10/29/15 2035 10/30/15 0705  Weight: 116.1 kg (256 lb) 116.2 kg (256 lb 3.2 oz) 114.8 kg (253 lb 1.4 oz)     Microbiology: Recent Results (from the past 240 hour(s))  Culture, blood (Routine X 2) w Reflex to ID Panel     Status: None   Collection Time: 10/24/15  3:35 PM  Result Value Ref Range Status   Specimen Description BLOOD HEMODIALYSIS CATHETER  Final   Special Requests BOTTLES DRAWN AEROBIC AND ANAEROBIC 10CC  Final   Culture NO GROWTH 5 DAYS  Final   Report Status 10/29/2015 FINAL  Final  Culture, blood (Routine X 2) w Reflex to ID Panel     Status: None   Collection Time: 10/24/15  3:50 PM  Result Value Ref Range Status   Specimen Description BLOOD HEMODIALYSIS CATHETER  Final   Special Requests BOTTLES DRAWN AEROBIC AND ANAEROBIC 10CC  Final   Culture NO GROWTH 5 DAYS  Final   Report Status 10/29/2015 FINAL  Final  MRSA PCR Screening     Status: None   Collection Time: 10/25/15  8:51 AM  Result Value Ref Range Status   MRSA by PCR NEGATIVE NEGATIVE Final    V[place]:        The GeneXpert MRSA Assay (FDA approved for NASAL specimens only), is one component of a comprehensive MRSA colonization surveillance program. It is not intended to diagnose MRSA infection nor to guide or monitor treatment for MRSA infections.   Surgical pcr screen     Status: None   Collection Time: 10/28/15  6:53 PM   Result Value Ref Range Status   MRSA, PCR NEGATIVE NEGATIVE Final   Staphylococcus aureus NEGATIVE NEGATIVE Final    [place]:        The Xpert SA Assay (FDA approved for NASAL specimens in patients over 16 years of age), is one component of a comprehensive surveillance program.  Test performance has been validated by [facility] for patients greater than or equal to 43 year old. It is not intended to diagnose infection nor to guide or monitor treatment.        Blood Culture    Component Value Date/Time   SDES BLOOD HEMODIALYSIS CATHETER 10/24/2015 1550   SPECREQUEST BOTTLES DRAWN AEROBIC AND ANAEROBIC 10CC 10/24/2015 1550   CULT NO GROWTH 5 DAYS 10/24/2015 1550   REPTSTATUS 10/29/2015 FINAL 10/24/2015 1550      Labs: Results for orders placed or performed during the hospital encounter of 10/08/15 (from the past 48 hour(s))  Surgical pcr screen     Status: None   Collection Time: 10/28/15  6:53 PM  Result Value Ref Range   MRSA, PCR NEGATIVE NEGATIVE   Staphylococcus aureus NEGATIVE NEGATIVE    [place]:        The Xpert SA Assay (FDA approved for NASAL specimens in patients over 98 years of age), is one component of a comprehensive surveillance program.  Test performance has been validated by [facility] for patients greater than or equal to 19 year old. It is not intended to diagnose infection nor to guide or monitor treatment.   Protime-INR     Status: Abnormal   Collection Time: 10/29/15  5:05 AM  Result Value Ref Range   Prothrombin Time 17.6 (H) 11.6 - 15.2 seconds   INR 1.44 0.00 - 1.49  CBC     Status: Abnormal   Collection Time: 10/29/15  5:05 AM  Result Value Ref Range   WBC 18.4 (H) 4.0 - 10.5 K/uL   RBC 2.79 (L) 4.22 - 5.81 MIL/uL   Hemoglobin 7.9 (L) 13.0 - 17.0 g/dL   HCT 23.4 (L) 39.0 - 52.0 %   MCV 83.9 78.0 - 100.0 fL   MCH 28.3 26.0 - 34.0 pg   MCHC 33.8 30.0 - 36.0 g/dL   RDW 13.6 11.5 - 15.5 %   Platelets 481 (H) 150 - 400 K/uL   Comprehensive metabolic panel     Status: Abnormal   Collection Time: 10/29/15  5:05 AM  Result Value Ref Range   Sodium 128 (L) 135 - 145 mmol/L   Potassium 4.3 3.5 - 5.1 mmol/L   Chloride 90 (L) 101 - 111 mmol/L   CO2 20 (L) 22 - 32 mmol/L   Glucose, Bld 104 (H) 65 - 99 mg/dL  BUN 145 (H) 6 - 20 mg/dL   Creatinine, Ser 11.53 (H) 0.61 - 1.24 mg/dL   Calcium 9.4 8.9 - 10.3 mg/dL   Total Protein 7.1 6.5 - 8.1 g/dL   Albumin 3.4 (L) 3.5 - 5.0 g/dL   AST 20 15 - 41 U/L   ALT 23 17 - 63 U/L   Alkaline Phosphatase 38 38 - 126 U/L   Total Bilirubin 0.9 0.3 - 1.2 mg/dL   GFR calc non Af Amer 5 (L) >60 mL/min   GFR calc Af Amer 6 (L) >60 mL/min    [place]: (NOTE) The eGFR has been calculated using the CKD EPI equation. This calculation has not been validated in all clinical situations. eGFR's persistently <60 mL/min signify possible Chronic Kidney Disease.    Anion gap 18 (H) 5 - 15  CBC     Status: Abnormal   Collection Time: 10/30/15  7:28 AM  Result Value Ref Range   WBC 19.1 (H) 4.0 - 10.5 K/uL   RBC 2.88 (L) 4.22 - 5.81 MIL/uL   Hemoglobin 8.2 (L) 13.0 - 17.0 g/dL   HCT 23.8 (L) 39.0 - 52.0 %   MCV 82.6 78.0 - 100.0 fL   MCH 28.5 26.0 - 34.0 pg   MCHC 34.5 30.0 - 36.0 g/dL   RDW 13.8 11.5 - 15.5 %   Platelets 433 (H) 150 - 400 K/uL  Renal function panel     Status: Abnormal   Collection Time: 10/30/15  7:28 AM  Result Value Ref Range   Sodium 128 (L) 135 - 145 mmol/L   Potassium 3.8 3.5 - 5.1 mmol/L   Chloride 90 (L) 101 - 111 mmol/L   CO2 19 (L) 22 - 32 mmol/L   Glucose, Bld 82 65 - 99 mg/dL   BUN 155 (H) 6 - 20 mg/dL   Creatinine, Ser 12.20 (H) 0.61 - 1.24 mg/dL   Calcium 9.0 8.9 - 10.3 mg/dL   Phosphorus 10.9 (H) 2.5 - 4.6 mg/dL   Albumin 3.3 (L) 3.5 - 5.0 g/dL   GFR calc non Af Amer 5 (L) >60 mL/min   GFR calc Af Amer 6 (L) >60 mL/min    [place]: (NOTE) The eGFR has been calculated using the CKD EPI equation. This calculation has not been validated in all  clinical situations. eGFR's persistently <60 mL/min signify possible Chronic Kidney Disease.    Anion gap 19 (H) 5 - 15     Lipid Panel     Component Value Date/Time   CHOL 238 (H) 10/20/2015 0320   TRIG 244 (H) 10/20/2015 0320   HDL 31 (L) 10/20/2015 0320   CHOLHDL 7.7 10/20/2015 0320   VLDL 49 (H) 10/20/2015 0320   LDLCALC 158 (H) 10/20/2015 0320     No results found for: HGBA1C   Lab Results  Component Value Date   LDLCALC 158 (H) 10/20/2015   CREATININE 12.20 (H) 10/30/2015     HPI :  Arshdeep Bolger is a 25 y.o. 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 He was admitted 07/03 with 1wk hx of fever, cough, sore throat, weakness. He had rapid strep + and was started on amoxicillin. He also had hypertensive urgency, outpatient meds resumed (doses adjusted by cards) and clonidine added. Echo revealed severe LVH likely due to long standing HTN.  Over  the course of his hospitalization, he has had worsening SOB and increase in weight from 240lbs on admit (07/03) to 261lbs on 07/19.  On 7/18, he had acute worsening of SOB along with increase in O2 needs.His case was further complicated by cardiac even- lateral ST changes, troponin to 0.9. This was later felt not to be ACS. HIs ECG changes were felt to be due to LVH. He developed SOB and wheezing around 7-19 and was found to have new infiltrate R > L. He was started on steroids. He was transferred to ICU. On CT he was found to have pulm edema and pneumonia. He was deffered from BAL due to need for intbx. He was started on vanco/cefepime, had urgent ultrafiltrate. He also required BiPAP.  Initially placed on BiPAP and after dialysis,   slowly weaned off BiPAP and now on room air  HOSPITAL COURSE:  * AKI vs. 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 Because of inability to show improvement of recovering renal clearance, he has been getting hemodialysis intermittently-now scheduled to undergo hemodialysis on a Tuesday/Thursday/Saturday schedule with the process initiated for outpatient dialysis unit placement. Permanent/long-term access also being pursued by [facility]-   [facility] evaluated for permanent access  status post placement of right basilic vein transposition fistula 7/24  leukocytosis likely secondary to steroids,  Discussed with Dr Posey Pronto, no need for CT abdomen pelvis to rule out perinephric hematoma as no fever , if he spikes fever will order renal USG   Status post ARTERIOVENOUS (AV) FISTULA CREATION vs INSERTION OF ARTERIOVENOUS GORTEX GRAFT 7/24 He resides in [facility] and requests for dialysis unit there for outpatient dialysis.has been accepted at the [facility] on a Tuesday, Thursday and Saturday schedule   Accelerated HTN with urgency -- On amlodipine, coreg,  , Hydralazine, minoxidil, verapamil, clonidine,  Aldactone On echo he has severe LVH and an intracavitary gradient. He also has a murmur on exam. This is due to his hypertension and LVH, not HCM.     Acute hypoxic respiratory failure - likely multifactorial in the setting of HCAP + volume overload / pulm edema Pro calcitonin 3.36 DDx of pulmonary edema vs HCAP (air bronchograms on CT) vs pulmonary hemorrhage post   right IJ HD cath placement 7/10 > highly doubt so given no hx hemoptysis at all. CT most c/w PNA and vol overload.   patient's respiratory status is slowly improving with dialysis currently requiring 3 L of oxygen at rest. To bronch will require intubation right now, even if pulmonary hemorrhage treatment will be supportive so risk well outweighs the benefit for bronchoscopy. No indication  for bronchoscopy per PCCM tx for HCAP with cefepime and vanc , vancomycin discontinued 7/22, ID recommends to continue with cefepime for a total of 8 days and then discontinue Patient initially placed on high-dose steroids which were slowly weaned and discontinued prior to discharge     Chest pain:  In the setting of pulmonary edema, currently on a nitro drip. Although his EKG does have changes, they are consistent with LVH with repolarization abnormality. His echo is reassuring and does not reveal any wall motion abnormalities. Troponin is elevated, but stable and he has AoCKD.  troponins  remained in the range of 0.8-0.95 , now trending down ECG with LVH. BP improved on Rx continue dialysis will arrange outpatient F/u with Dr Oval Linsey consider myovue in 6-8 weeks  as per Josue Hector, MD  Cardiology has signed off    Anemia-likely anemia of chronic disease Patient had a drop in hemoglobin from 12->8.8 on 7/16, now 7.9, transfuse  with hemodialysis, will defer to nephrology. Patient has been refusing labs for the last 2 days , last hemoglobin 8.2  r  Strep throat - Treated with amoxicillin, completed  Acute urinary retention with spasms - Patient insistent that foley come out 7/15 -  - Continue morphine PRN pain   Hyponatremia - Stable and mild, likely due to renal failure  Hypokalemia Last potassium 3.8  Leukocytosis - Unclear cause, improved initially and now  21.0>19.8, 19.1 on the day of discharge   pro-calcitonin 3.36 Completed treatment for healthcare associated pneumonia     Discharge Exam:   Blood pressure (!) 154/83, pulse 100, temperature 97.9 F (36.6 C), temperature source Oral, resp. rate 19, height 5' 10"  (1.778 m), weight 114.8 kg (253 lb 1.4 oz), SpO2 96 %.  Exam: Gen: Comfortably resting in bed, in no distress CVS: Pulse regular rhythm, normal rate, S1 and S2 normal Resp: Fine rales right side, no rhonchi or wheeze Abd: Soft, obese,  nontender Ext: No lower extremity edema    Follow-up [place] Follow up on 11/06/2015.   Why:  Appointment scheduled at the [facility] 8-1 at 2:30p with Dr. Lisette Grinder information: [address] [place] [address] [phone]       Ruta Hinds, MD Follow up in 6 week(s).   Specialties:  Vascular Surgery, Cardiology Why:  Office will call you to arrange your appt (sent) Contact information: [place] [phone]           Signed: Reyne Dumas 10/30/2015, 8:37 AM        Time spent >45 mins

## 2015-11-06 ENCOUNTER — Ambulatory Visit: Payer: Medicaid Other | Attending: Family Medicine | Admitting: Family Medicine

## 2015-11-06 ENCOUNTER — Encounter: Payer: Self-pay | Admitting: Family Medicine

## 2015-11-06 VITALS — BP 159/95 | HR 106 | Temp 98.3°F | Ht 70.0 in | Wt 244.4 lb

## 2015-11-06 DIAGNOSIS — N059 Unspecified nephritic syndrome with unspecified morphologic changes: Secondary | ICD-10-CM

## 2015-11-06 DIAGNOSIS — I120 Hypertensive chronic kidney disease with stage 5 chronic kidney disease or end stage renal disease: Secondary | ICD-10-CM | POA: Diagnosis present

## 2015-11-06 DIAGNOSIS — Z7982 Long term (current) use of aspirin: Secondary | ICD-10-CM | POA: Diagnosis not present

## 2015-11-06 DIAGNOSIS — N185 Chronic kidney disease, stage 5: Secondary | ICD-10-CM

## 2015-11-06 DIAGNOSIS — Z79899 Other long term (current) drug therapy: Secondary | ICD-10-CM | POA: Insufficient documentation

## 2015-11-06 DIAGNOSIS — I10 Essential (primary) hypertension: Secondary | ICD-10-CM

## 2015-11-06 DIAGNOSIS — N008 Acute nephritic syndrome with other morphologic changes: Secondary | ICD-10-CM

## 2015-11-06 NOTE — Progress Notes (Signed)
Kidney MD took patient off many of his medications

## 2015-11-06 NOTE — Progress Notes (Signed)
Subjective:  Patient ID: Joseph Villegas, male    DOB: 1990-08-17  Age: 25 y.o. 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  He was recently Hospitalized at [facility] from 10/08/15 through 10/30/15 for poststreptococcal glomerulonephritis, malignant Hypertension, acute on chronic kidney disease. Renal biopsy revealed thrombotic microangiopathic the due to malignant hypertension with ATN and aberrantly preserved tubulointerstitium. He did have hypoxic respiratory failure which required intubation and ICU management.  Seen by his Nephrologist- Dr Janit Pagan with [facility] and His hypertension regimen was adjusted with discontinuation of minoxidil, metoprolol, verapamil and clonidine was increased to 0.2 mg twice daily.  He is scheduled to see vascular surgery in 6 weeks for creation of AV fistula.  He has no additional complaints at this time  Outpatient Medications Prior to Visit  Medication Sig Dispense Refill  . amLODipine (NORVASC) 10 MG tablet Take 1 tablet (10 mg total) by mouth daily. 30 tablet 1  . atorvastatin (LIPITOR) 20 MG tablet Take 1 tablet (20 mg total) by mouth daily at 6 PM. 30 tablet 1  . hydrALAZINE (APRESOLINE) 100 MG tablet Take 1 tablet (100 mg total) by mouth every 8 (eight) hours. 90 tablet 1  . cloNIDine (CATAPRES) 0.1 MG tablet Take 1 tablet (0.1 mg total) by mouth daily. 60 tablet 11  . aspirin EC 325 MG tablet Take 325 mg by mouth daily as needed (for headaches).    Marland Kitchen spironolactone (ALDACTONE) 25 MG tablet Take 1 tablet (25 mg total) by mouth daily. (Patient not taking:  Reported on 11/06/2015) 30 tablet 2  . carvedilol (COREG) 25 MG tablet Take 1 tablet (25 mg total) by mouth 2 (two) times daily with a meal. (Patient not taking: Reported on 11/06/2015) 60 tablet 1  . lanthanum (FOSRENOL) 1000 MG chewable tablet Chew 1 tablet (1,000 mg total) by mouth 3 (three) times daily with meals. (Patient not taking: Reported on 11/06/2015) 90 tablet 1  . metoprolol tartrate (LOPRESSOR) 25 MG tablet Take 1 tablet (25 mg total) by mouth 2 (two) times daily. (Patient not taking: Reported on 11/06/2015) 60 tablet 1  . minoxidil (LONITEN) 2.5 MG tablet Take 3 tablets (7.5 mg total) by mouth 2 (two) times daily. (Patient not taking: Reported on 11/06/2015) 60 tablet 1  . pantoprazole (PROTONIX) 40 MG tablet Take 1 tablet (40 mg total) by mouth daily at 6 (six) AM. (Patient not taking: Reported on 11/06/2015) 30 tablet 2  . verapamil (CALAN-SR) 240 MG CR tablet Take 1 tablet (240 mg total) by mouth daily. (Patient not taking: Reported on 11/06/2015) 30 tablet 2   No facility-administered medications prior to visit.     ROS Review of Systems  Constitutional: Negative for activity change and appetite change.  HENT: Negative for sinus pressure and sore throat.   Eyes: Negative for visual disturbance.  Respiratory: Negative for cough, chest tightness and shortness of breath.   Cardiovascular: Negative for chest pain and leg swelling.  Gastrointestinal: Negative for abdominal distention, abdominal pain, constipation and diarrhea.  Endocrine: Negative.   Genitourinary: Negative for dysuria.  Musculoskeletal: Negative for joint swelling and myalgias.  Skin: Negative for rash.  Allergic/Immunologic:  Negative.   Neurological: Negative for weakness, light-headedness and numbness.  Psychiatric/Behavioral: Negative for dysphoric mood and suicidal ideas.    Objective:  BP (!) 159/95 (BP Location: Left Arm, Patient Position: Sitting, Cuff Size: Large)   Pulse (!) 106   Temp 98.3 F (36.8 C)  (Oral)   Ht 5\' 10"  (1.778 m)   Wt 244 lb 6.4 oz (110.9 kg)   SpO2 100%   BMI 35.07 kg/m   BP/Weight 11/06/2015 10/30/2015 10/08/2015  Systolic BP 159 158 -  Diastolic BP 95 74 -  Wt. (Lbs) 244.4 246.7 -  BMI 35.07 - 35.4      Physical Exam  Constitutional: He is oriented to person, place, and time. He appears well-developed and well-nourished.  Cardiovascular: Normal rate, normal heart sounds and intact distal pulses.   No murmur heard. Pulmonary/Chest: Effort normal and breath sounds normal. He has no wheezes. He has no rales. He exhibits no tenderness.  Right chestwall perm-A- cath  Abdominal: Soft. Bowel sounds are normal. He exhibits no distension and no mass. There is no tenderness.  Musculoskeletal: Normal range of motion.  Neurological: He is alert and oriented to person, place, and time.     Assessment & Plan:   1. Essential hypertension Trolled Will defer regimen changes to nephrologist in order to prevent hypotension which could occur after dialysis  2. CKD (chronic kidney disease), stage 5 (HCC) Currently on dialysis on Tuesdays, Thursdays and Saturdays  3. Post-streptococcal glomerulonephritis Improved clinically and patient is doing well.  I have discussed with him that given he lives in [facility] if he would like to continue his primary care there it would not be a problem.  No orders of the defined types were placed in this encounter.   Follow-up: Return in about 3 months (around 02/06/2016) for follow up on Hypertension.   Jaclyn Shaggy MD

## 2015-11-06 NOTE — Patient Instructions (Signed)
Hypertension Hypertension, commonly called high blood pressure, is when the force of blood pumping through your arteries is too strong. Your arteries are the blood vessels that carry blood from your heart throughout your body. A blood pressure reading consists of a higher number over a lower number, such as 110/72. The higher number (systolic) is the pressure inside your arteries when your heart pumps. The lower number (diastolic) is the pressure inside your arteries when your heart relaxes. Ideally you want your blood pressure below 120/80. Hypertension forces your heart to work harder to pump blood. Your arteries may become narrow or stiff. Having untreated or uncontrolled hypertension can cause heart attack, stroke, kidney disease, and other problems. RISK FACTORS Some risk factors for high blood pressure are controllable. Others are not.  Risk factors you cannot control include:   Race. You may be at higher risk if you are African American.  Age. Risk increases with age.  Gender. Men are at higher risk than women before age 45 years. After age 65, women are at higher risk than men. Risk factors you can control include:  Not getting enough exercise or physical activity.  Being overweight.  Getting too much fat, sugar, calories, or salt in your diet.  Drinking too much alcohol. SIGNS AND SYMPTOMS Hypertension does not usually cause signs or symptoms. Extremely high blood pressure (hypertensive crisis) may cause headache, anxiety, shortness of breath, and nosebleed. DIAGNOSIS To check if you have hypertension, your health care provider will measure your blood pressure while you are seated, with your arm held at the level of your heart. It should be measured at least twice using the same arm. Certain conditions can cause a difference in blood pressure between your right and left arms. A blood pressure reading that is higher than normal on one occasion does not mean that you need treatment. If  it is not clear whether you have high blood pressure, you may be asked to return on a different day to have your blood pressure checked again. Or, you may be asked to monitor your blood pressure at home for 1 or more weeks. TREATMENT Treating high blood pressure includes making lifestyle changes and possibly taking medicine. Living a healthy lifestyle can help lower high blood pressure. You may need to change some of your habits. Lifestyle changes may include:  Following the DASH diet. This diet is high in fruits, vegetables, and whole grains. It is low in salt, red meat, and added sugars.  Keep your sodium intake below 2,300 mg per day.  Getting at least 30-45 minutes of aerobic exercise at least 4 times per week.  Losing weight if necessary.  Not smoking.  Limiting alcoholic beverages.  Learning ways to reduce stress. Your health care provider may prescribe medicine if lifestyle changes are not enough to get your blood pressure under control, and if one of the following is true:  You are 18-59 years of age and your systolic blood pressure is above 140.  You are 60 years of age or older, and your systolic blood pressure is above 150.  Your diastolic blood pressure is above 90.  You have diabetes, and your systolic blood pressure is over 140 or your diastolic blood pressure is over 90.  You have kidney disease and your blood pressure is above 140/90.  You have heart disease and your blood pressure is above 140/90. Your personal target blood pressure may vary depending on your medical conditions, your age, and other factors. HOME CARE INSTRUCTIONS    Have your blood pressure rechecked as directed by your health care provider.   Take medicines only as directed by your health care provider. Follow the directions carefully. Blood pressure medicines must be taken as prescribed. The medicine does not work as well when you skip doses. Skipping doses also puts you at risk for  problems.  Do not smoke.   Monitor your blood pressure at home as directed by your health care provider. SEEK MEDICAL CARE IF:   You think you are having a reaction to medicines taken.  You have recurrent headaches or feel dizzy.  You have swelling in your ankles.  You have trouble with your vision. SEEK IMMEDIATE MEDICAL CARE IF:  You develop a severe headache or confusion.  You have unusual weakness, numbness, or feel faint.  You have severe chest or abdominal pain.  You vomit repeatedly.  You have trouble breathing. MAKE SURE YOU:   Understand these instructions.  Will watch your condition.  Will get help right away if you are not doing well or get worse.   This information is not intended to replace advice given to you by your health care provider. Make sure you discuss any questions you have with your health care provider.   Document Released: 03/24/2005 Document Revised: 08/08/2014 Document Reviewed: 01/14/2013 Elsevier Interactive Patient Education 2016 Elsevier Inc.  

## 2015-11-30 ENCOUNTER — Ambulatory Visit: Payer: Self-pay | Admitting: Cardiovascular Disease

## 2015-11-30 NOTE — Progress Notes (Deleted)
Cardiology Office Note   Date:  11/30/2015   ID:  Joseph Villegas, DOB July 11, 1990, MRN [id number]  PCP:  No PCP Per Patient  Cardiologist:   Chilton Siiffany Luling, MD   No chief complaint on file.     History of Present Illness: Joseph Villegas is a 25 y.o. male with malignant hypertension and hypertensive nephrosclerosis who presents for follow-up after recent hospitalization. Mr. Joseph Villegas was admitted 10/2015 with acute on chronic kidney disease.  During that hospitalization he required initiation of hemodialysis. In a-V fistula was placed on 7/24.   He also had hypertensive urgency and carvedilol, hydralazine, minoxidil, and verapamil were added to his medical regimen.  He had an echocardiogram that revealed LVEF 65-70% with severe LVH, an intracavitary gradient and grade 1 diastolic dysfunction.  While in the hospital he also developed pulmonary edema thought to be due to his malignant hypertension. EKG had no changes other than repolarization abnormalities secondary to LVH. Troponin was mildly elevated at 0.95.  Lm   Past Medical History:  Diagnosis Date  . Hypertension     Past Surgical History:  Procedure Laterality Date  . AV FISTULA PLACEMENT Right 10/29/2015   Procedure: ARTERIOVENOUS (AV) FISTULA;  Surgeon: Sherren Kernsharles E Fields, MD;  [facility];  Service: Vascular;  Laterality: Right;  . PILONIDAL CYST / SINUS EXCISION  2008     Current Outpatient Prescriptions  Medication Sig Dispense Refill  . amLODipine (NORVASC) 10 MG tablet Take 1 tablet (10 mg total) by mouth daily. 30 tablet 1  . aspirin EC 325 MG tablet Take 325 mg by mouth daily as needed (for headaches).    Marland Kitchen. atorvastatin (LIPITOR) 20 MG tablet Take 1 tablet (20 mg total) by mouth daily at 6 PM. 30 tablet 1  . carvedilol (COREG) 25 MG tablet Take 25 mg by mouth 2 (two) times daily with a meal.    . cloNIDine (CATAPRES) 0.2 MG tablet Take 0.2 mg by mouth 2 (two) times daily.    . furosemide (LASIX) 40 MG  tablet Take 40 mg by mouth. Twice daily    . hydrALAZINE (APRESOLINE) 100 MG tablet Take 1 tablet (100 mg total) by mouth every 8 (eight) hours. 90 tablet 1  . spironolactone (ALDACTONE) 25 MG tablet Take 1 tablet (25 mg total) by mouth daily. (Patient not taking: Reported on 11/06/2015) 30 tablet 2   No current facility-administered medications for this visit.     Allergies:   No known allergies    Social History:  The patient  reports that he has never smoked. He has never used smokeless tobacco. He reports that he does not drink alcohol or use drugs.   Family History:  The patient's ***family history includes CAD in his father; Diabetes in his mother and sister; Hypertension in his brother and mother.    ROS:  Please see the history of present illness.   Otherwise, review of systems are positive for {NONE DEFAULTED:18576::"none"}.   All other systems are reviewed and negative.    PHYSICAL EXAM: VS:  There were no vitals taken for this visit. , BMI There is no height or weight on file to calculate BMI. GENERAL:  Well appearing HEENT:  Pupils equal round and reactive, fundi not visualized, oral mucosa unremarkable NECK:  No jugular venous distention, waveform within normal limits, carotid upstroke brisk and symmetric, no bruits, no thyromegaly LYMPHATICS:  No cervical adenopathy LUNGS:  Clear to auscultation bilaterally HEART:  RRR.  PMI not displaced or sustained,S1  and S2 within normal limits, no S3, no S4, no clicks, no rubs, *** murmurs ABD:  Flat, positive bowel sounds normal in frequency in pitch, no bruits, no rebound, no guarding, no midline pulsatile mass, no hepatomegaly, no splenomegaly EXT:  2 plus pulses throughout, no edema, no cyanosis no clubbing SKIN:  No rashes no nodules NEURO:  Cranial nerves II through XII grossly intact, motor grossly intact throughout PSYCH:  Cognitively intact, oriented to person place and time    EKG:  EKG {ACTION; IS/IS ZOX:09604540} ordered  today. The ekg ordered today demonstrates ***  Echo 10/19/15:  Study Conclusions  - Procedure narrative: Transthoracic echocardiography. Image   quality was adequate. The study was technically difficult. - Left ventricle: The cavity size was normal. Wall thickness was   increased in a pattern of severe LVH. Systolic function was   vigorous. The estimated ejection fraction was in the range of 65%   to 70%. Wall motion was normal; there were no regional wall   motion abnormalities. There was fusion of early and atrial   contributions to ventricular filling. Doppler parameters are   consistent with abnormal left ventricular relaxation (grade 1   diastolic dysfunction). - Left atrium: The atrium was moderately dilated.   Recent Labs: 10/29/2015: ALT 23 10/30/2015: BUN 155; Creatinine, Ser 12.20; Hemoglobin 8.2; Platelets 433; Potassium 3.8; Sodium 128    Lipid Panel    Component Value Date/Time   CHOL 238 (H) 10/20/2015 0320   TRIG 244 (H) 10/20/2015 0320   HDL 31 (L) 10/20/2015 0320   CHOLHDL 7.7 10/20/2015 0320   VLDL 49 (H) 10/20/2015 0320   LDLCALC 158 (H) 10/20/2015 0320      Wt Readings from Last 3 Encounters:  11/06/15 244 lb 6.4 oz (110.9 kg)  10/30/15 246 lb 11.1 oz (111.9 kg)      ASSESSMENT AND PLAN:  ***   Current medicines are reviewed at length with the patient today.  The patient {ACTIONS; HAS/DOES NOT HAVE:19233} concerns regarding medicines.  The following changes have been made:  {PLAN; NO CHANGE:13088:s}  Labs/ tests ordered today include: *** No orders of the defined types were placed in this encounter.    Disposition:   FU with ***    This note was written with the assistance of speech recognition software.  Please excuse any transcriptional errors.  Signed, Michaila Kenney C. Duke Salvia, MD, [facility]  11/30/2015 1:07 PM    [facility]

## 2015-12-03 ENCOUNTER — Encounter: Payer: Self-pay | Admitting: *Deleted

## 2015-12-03 NOTE — Progress Notes (Signed)
Letter mailed

## 2015-12-04 ENCOUNTER — Encounter: Payer: Self-pay | Admitting: Vascular Surgery

## 2015-12-13 ENCOUNTER — Encounter: Payer: Self-pay | Admitting: Vascular Surgery

## 2015-12-13 ENCOUNTER — Other Ambulatory Visit: Payer: Self-pay | Admitting: Vascular Surgery

## 2015-12-13 ENCOUNTER — Ambulatory Visit (INDEPENDENT_AMBULATORY_CARE_PROVIDER_SITE_OTHER): Payer: Self-pay | Admitting: Vascular Surgery

## 2015-12-13 ENCOUNTER — Ambulatory Visit (HOSPITAL_COMMUNITY)
Admission: RE | Admit: 2015-12-13 | Discharge: 2015-12-13 | Disposition: A | Discharge status: Home / Self Care | Payer: Medicaid Other | Source: Ambulatory Visit | Attending: Family | Admitting: Family

## 2015-12-13 VITALS — BP 138/89 | HR 78 | Temp 97.1°F | Resp 16 | Ht 70.0 in | Wt 250.0 lb

## 2015-12-13 DIAGNOSIS — N186 End stage renal disease: Secondary | ICD-10-CM

## 2015-12-13 DIAGNOSIS — Z4931 Encounter for adequacy testing for hemodialysis: Secondary | ICD-10-CM | POA: Diagnosis not present

## 2015-12-13 DIAGNOSIS — Z992 Dependence on renal dialysis: Secondary | ICD-10-CM

## 2015-12-13 NOTE — Progress Notes (Signed)
Patient is a 25 year old male who returns for follow-up after placement of a first stage basilic vein transposition fistula on 10/29/2015. He is currently dialyzing via a right-sided catheter. He denies any numbness or tingling in his hand. He dialyzes Tuesday Thursday Saturday.  Physical exam:  Vitals:   12/13/15 1039 12/13/15 1042  BP: (!) 145/98 138/89  Pulse: 78   Resp: 16   Temp: 97.1 F (36.2 C)   TempSrc: Oral   SpO2: 100%   Weight: 250 lb (113.4 kg)   Height: 5\' 10"  (1.778 m)     Right upper extremity: Well-healed incisions palpable thrill audible bruit in fistula  Data: Duplex ultrasound today shows the fistula diameter between 4 and 7 mm. He  Assessment: Slowly maturing right arm AV fistula. The patient will return in 1 month if his fistula is still less than 6 mm in any area we will plan to schedule him for a fistulogram and possible angioplasty.  Plan: See above  Fabienne Brunsharles Fields, MD [facility] Office: [phone] Pager: [phone]

## 2016-01-21 ENCOUNTER — Encounter: Payer: Self-pay | Admitting: Vascular Surgery

## 2016-01-24 ENCOUNTER — Ambulatory Visit (INDEPENDENT_AMBULATORY_CARE_PROVIDER_SITE_OTHER): Payer: Medicare Other | Admitting: Vascular Surgery

## 2016-01-24 ENCOUNTER — Ambulatory Visit (HOSPITAL_COMMUNITY)
Admission: RE | Admit: 2016-01-24 | Discharge: 2016-01-24 | Disposition: A | Discharge status: Home / Self Care | Payer: Medicare Other | Source: Ambulatory Visit | Attending: Vascular Surgery | Admitting: Vascular Surgery

## 2016-01-24 ENCOUNTER — Encounter: Payer: Self-pay | Admitting: Vascular Surgery

## 2016-01-24 ENCOUNTER — Other Ambulatory Visit: Payer: Self-pay | Admitting: *Deleted

## 2016-01-24 VITALS — BP 107/79 | HR 70 | Temp 98.6°F | Resp 18 | Ht 70.0 in | Wt 256.0 lb

## 2016-01-24 DIAGNOSIS — N186 End stage renal disease: Secondary | ICD-10-CM

## 2016-01-24 DIAGNOSIS — Z992 Dependence on renal dialysis: Secondary | ICD-10-CM | POA: Insufficient documentation

## 2016-01-24 NOTE — Progress Notes (Signed)
Patient is a 24-year-old male who returns for follow-up today after placement of a right basilic vein fistula placed July 2017. It has been maturing slowly. He returns today for follow-up duplex scan. He is currently dialyzing via a right-sided catheter. Current dialysis today is Tuesday Thursday Saturday.  Current Outpatient Prescriptions on File Prior to Visit  Medication Sig Dispense Refill  . amLODipine (NORVASC) 10 MG tablet Take 1 tablet (10 mg total) by mouth daily. 30 tablet 1  . atorvastatin (LIPITOR) 20 MG tablet Take 1 tablet (20 mg total) by mouth daily at 6 PM. 30 tablet 1  . carvedilol (COREG) 25 MG tablet Take 25 mg by mouth 2 (two) times daily with a meal.    . cloNIDine (CATAPRES) 0.2 MG tablet Take 0.3 mg by mouth 2 (two) times daily.     . furosemide (LASIX) 40 MG tablet Take 40 mg by mouth. Twice daily    . hydrALAZINE (APRESOLINE) 100 MG tablet Take 1 tablet (100 mg total) by mouth every 8 (eight) hours. 90 tablet 1  . spironolactone (ALDACTONE) 25 MG tablet Take 1 tablet (25 mg total) by mouth daily. 30 tablet 2  . aspirin EC 325 MG tablet Take 325 mg by mouth daily as needed (for headaches).     No current facility-administered medications on file prior to visit.     Allergies  Allergen Reactions  . No Known Allergies    Past Medical History:  Diagnosis Date  . Chronic kidney disease   . Hypertension     Physical exam:  Vitals:   01/24/16 1452  BP: 107/79  Pulse: 70  Resp: 18  Temp: 98.6 F (37 C)  TempSrc: Oral  SpO2: 96%  Weight: 256 lb (116.1 kg)  Height: 5' 10" (1.778 m)    Right upper extremity: 2+ brachial radial pulse no audible bruit or palpable thrill in the fistula well-healed scars  Data: Duplex ultrasound today shows the fistula is occluded.  Assessment: Occluded AV fistula. Possible inflow narrowing versus poor quality vein.  Plan: At this point I think the patient needs a right upper arm AV graft. We may need to consider an upper arm  loop if the arterial flow was not adequate. Risks benefits possible, the patient's procedure details were discussed with the patient today. He will proceed with a right arm AV graft on 02/06/2016.  Charles Fields, MD [facility] [place] Office: [phone] Pager: [phone]  

## 2016-02-01 ENCOUNTER — Emergency Department (HOSPITAL_BASED_OUTPATIENT_CLINIC_OR_DEPARTMENT_OTHER): Payer: Medicare Other

## 2016-02-01 ENCOUNTER — Other Ambulatory Visit: Payer: Self-pay

## 2016-02-01 ENCOUNTER — Encounter (HOSPITAL_BASED_OUTPATIENT_CLINIC_OR_DEPARTMENT_OTHER): Payer: Self-pay | Admitting: *Deleted

## 2016-02-01 ENCOUNTER — Emergency Department (HOSPITAL_BASED_OUTPATIENT_CLINIC_OR_DEPARTMENT_OTHER)
Admission: EM | Admit: 2016-02-01 | Discharge: 2016-02-01 | Disposition: A | Discharge status: Other Acute Inpatient Hospital | Payer: Medicare Other | Attending: Emergency Medicine | Admitting: Emergency Medicine

## 2016-02-01 DIAGNOSIS — Z992 Dependence on renal dialysis: Secondary | ICD-10-CM | POA: Diagnosis not present

## 2016-02-01 DIAGNOSIS — I10 Essential (primary) hypertension: Secondary | ICD-10-CM

## 2016-02-01 DIAGNOSIS — Z7982 Long term (current) use of aspirin: Secondary | ICD-10-CM | POA: Diagnosis not present

## 2016-02-01 DIAGNOSIS — I120 Hypertensive chronic kidney disease with stage 5 chronic kidney disease or end stage renal disease: Secondary | ICD-10-CM | POA: Insufficient documentation

## 2016-02-01 DIAGNOSIS — R42 Dizziness and giddiness: Secondary | ICD-10-CM | POA: Diagnosis present

## 2016-02-01 DIAGNOSIS — R000 Tachycardia, unspecified: Secondary | ICD-10-CM

## 2016-02-01 DIAGNOSIS — N186 End stage renal disease: Secondary | ICD-10-CM

## 2016-02-01 DIAGNOSIS — R112 Nausea with vomiting, unspecified: Secondary | ICD-10-CM

## 2016-02-01 LAB — CBC WITH DIFFERENTIAL/PLATELET
BASOS PCT: 0 %
Basophils Absolute: 0 10*3/uL (ref 0.0–0.1)
EOS ABS: 0 10*3/uL (ref 0.0–0.7)
EOS PCT: 0 %
HCT: 45.4 % (ref 39.0–52.0)
HEMOGLOBIN: 15.5 g/dL (ref 13.0–17.0)
LYMPHS ABS: 1 10*3/uL (ref 0.7–4.0)
Lymphocytes Relative: 9 %
MCH: 28.8 pg (ref 26.0–34.0)
MCHC: 34.1 g/dL (ref 30.0–36.0)
MCV: 84.4 fL (ref 78.0–100.0)
MONO ABS: 0.7 10*3/uL (ref 0.1–1.0)
MONOS PCT: 6 %
Neutro Abs: 9.4 10*3/uL — ABNORMAL HIGH (ref 1.7–7.7)
Neutrophils Relative %: 85 %
Platelets: 286 10*3/uL (ref 150–400)
RBC: 5.38 MIL/uL (ref 4.22–5.81)
RDW: 14.5 % (ref 11.5–15.5)
WBC: 11.2 10*3/uL — ABNORMAL HIGH (ref 4.0–10.5)

## 2016-02-01 LAB — COMPREHENSIVE METABOLIC PANEL
ALBUMIN: 4.9 g/dL (ref 3.5–5.0)
ALT: 12 U/L — AB (ref 17–63)
AST: 8 U/L — AB (ref 15–41)
Alkaline Phosphatase: 62 U/L (ref 38–126)
Anion gap: 20 — ABNORMAL HIGH (ref 5–15)
BILIRUBIN TOTAL: 0.6 mg/dL (ref 0.3–1.2)
BUN: 75 mg/dL — AB (ref 6–20)
CHLORIDE: 101 mmol/L (ref 101–111)
CO2: 18 mmol/L — ABNORMAL LOW (ref 22–32)
CREATININE: 12.23 mg/dL — AB (ref 0.61–1.24)
Calcium: 10.1 mg/dL (ref 8.9–10.3)
GFR calc Af Amer: 6 mL/min — ABNORMAL LOW (ref >60)
GFR, EST NON AFRICAN AMERICAN: 5 mL/min — AB (ref >60)
GLUCOSE: 90 mg/dL (ref 65–99)
Potassium: 4.7 mmol/L (ref 3.5–5.1)
Sodium: 139 mmol/L (ref 135–145)
TOTAL PROTEIN: 9.5 g/dL — AB (ref 6.5–8.1)

## 2016-02-01 LAB — TROPONIN I: TROPONIN I: 0.24 ng/mL — AB (ref <0.03)

## 2016-02-01 LAB — LIPASE, BLOOD: Lipase: 55 U/L — ABNORMAL HIGH (ref 11–51)

## 2016-02-01 MED ORDER — ONDANSETRON HCL 4 MG/2ML IJ SOLN
4.0000 mg | Freq: Once | INTRAMUSCULAR | Status: AC
  Administered 2016-02-01: 4 mg via INTRAVENOUS
  Filled 2016-02-01: qty 2

## 2016-02-01 MED ORDER — HYDRALAZINE HCL 20 MG/ML IJ SOLN
20.0000 mg | Freq: Once | INTRAMUSCULAR | Status: AC
Start: 2016-02-01 — End: 2016-02-01
  Administered 2016-02-01: 20 mg via INTRAVENOUS
  Filled 2016-02-01: qty 1

## 2016-02-01 MED ORDER — LABETALOL HCL 5 MG/ML IV SOLN
2.0000 mg/min | INTRAVENOUS | Status: DC
  Filled 2016-02-01: qty 100

## 2016-02-01 NOTE — ED Notes (Signed)
Still makes urine, voids ~ TID, last void 0500.

## 2016-02-01 NOTE — ED Provider Notes (Signed)
Care assumed from Dr. Elesa MassedWard at shift change. Patient presented here with complaints of persistent vomiting and generalized malaise. 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 Today's laboratory studies do not reveal hyperkalemia, significant acidosis, and chest x-ray does not appear for him to be volume overloaded although he did miss his dialysis yesterday. He is persistently tachycardic here in the ER, the etiology of which I am uncertain. He has no fever, no white count, and does not appear toxic or septic. I've considered pulmonary embolism, however oxygen saturations are 100% and he denies any difficulty breathing.  I discussed the case with Dr. Lequita HaltLufadeju from nephrology at [facility] who is recommending the patient be evaluated at [facility]. I have also spoken with Dr. Delorse LekPadgett in the emergency department at [facility] who agrees to accept the patient in transfer.   Geoffery Lyonsouglas Alleyne Lac, MD 02/01/16 [phone]

## 2016-02-01 NOTE — ED Notes (Signed)
Back from CT/xray, no changes. [facility]

## 2016-02-01 NOTE — ED Notes (Signed)
Dr. Ward into room, at BS.  

## 2016-02-01 NOTE — ED Provider Notes (Signed)
TIME SEEN: 6:15 AM    CHIEF COMPLAINT: Hypertension, nausea and vomiting, vertigo, "I don't feel well"  HPI: Pt is a 25 y.o. 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 Reports he thinks he has had a total of 10 episodes of nonbloody, nonbilious vomiting. No diarrhea. States that he thinks that his blood pressure was elevated first and that is what is causing him to feel poorly because this has happened to him before. He is on multiple blood pressure medications. States that he ran out of his clonidine Thursday 10/26 morning.  Patient reports he and his family are currently homeless which makes it hard for him to get dialysis. Last dialysis was Tuesday 10/24. He reports that he is compliant with his blood pressure medication. Reports he still makes urine. He denies any headache, vision changes, numbness or focal weakness. Reports intermittent vertigo but none currently. Reports he has had "shakes" but no fevers or feeling chilled. Denies chest pain or chest discomfort. No shortness of breath. Denies abdominal pain.  His nephrologist is with [facility].  He is scheduled to have surgery by Dr. Darrick Penna with vascular surgery on November 1 for a RUE graft.  Had basilic fistula placed in right arm July 2017 that has occluded.  Currently receiving dialysis through right chest catheter.  His PCP is Dr. Venetia Night at [facility].  ROS: See HPI Constitutional: no fever  Eyes: no drainage  ENT: no runny nose   Cardiovascular:  no chest pain  Resp: no SOB  GI:  vomiting GU: no dysuria Integumentary: no rash  Allergy: no hives  Musculoskeletal: no leg swelling  Neurological: no slurred speech ROS otherwise negative  PAST MEDICAL HISTORY/PAST SURGICAL HISTORY:  Past  Medical History:  Diagnosis Date  . Chronic kidney disease   . Hypertension     MEDICATIONS:  Prior to Admission medications   Medication Sig Start Date End Date Taking? Authorizing Provider  amLODipine (NORVASC) 10 MG tablet Take 1 tablet (10 mg total) by mouth daily. 10/30/15   Richarda Overlie, MD  aspirin EC 325 MG tablet Take 325 mg by mouth daily as needed (for headaches).    Historical Provider, MD  atorvastatin (LIPITOR) 20 MG tablet Take 1 tablet (20 mg total) by mouth daily at 6 PM. 10/30/15   Richarda Overlie, MD  carvedilol (COREG) 25 MG tablet Take 25 mg by mouth 2 (two) times daily with a meal.    Historical Provider, MD  cloNIDine (CATAPRES) 0.2 MG tablet Take 0.3 mg by mouth 2 (two) times daily.     Historical Provider, MD  furosemide (LASIX) 40 MG tablet Take 40 mg by mouth. Twice daily    Historical Provider, MD  hydrALAZINE (APRESOLINE) 100 MG tablet Take 1 tablet (100 mg total) by mouth every 8 (eight) hours. 10/30/15   Richarda Overlie, MD  spironolactone (ALDACTONE) 25 MG tablet Take 1 tablet (25 mg total) by mouth daily. 10/30/15   Richarda Overlie, MD    ALLERGIES:  Allergies  Allergen Reactions  . No Known Allergies     SOCIAL HISTORY:  Social History  Substance Use Topics  . Smoking status: Never Smoker  . Smokeless tobacco: Never Used  . Alcohol use No    FAMILY HISTORY: Family History  Problem Relation Age of Onset  . Hypertension Mother   .  Diabetes Mother   . Hypertension Brother   . Diabetes Sister   . CAD Father     EXAM: BP (!) 227/146 (BP Location: Right Arm)   Pulse (!) 126   Temp 98.7 F (37.1 C) (Oral)   Resp 20   Ht 5\' 10"  (1.778 m)   Wt 248 lb (112.5 kg)   SpO2 100%   BMI 35.58 kg/m  CONSTITUTIONAL: Alert and oriented and responds appropriately to questions. Appears uncomfortable, afebrile, nontoxic HEAD: Normocephalic EYES: Conjunctivae clear, PERRL ENT: normal nose; no rhinorrhea; moist mucous membranes NECK: Supple, no meningismus, no  LAD  CARD: Regular and tachycardic; S1 and S2 appreciated; no murmurs, no clicks, no rubs, no gallops CHEST:  Dialysis catheter and right chest wall without any erythema, warmth or tenderness RESP: Normal chest excursion without splinting or tachypnea; breath sounds clear and equal bilaterally; no wheezes, no rhonchi, no rales, no hypoxia or respiratory distress, speaking full sentences ABD/GI: Normal bowel sounds; non-distended; soft, non-tender, no rebound, no guarding, no peritoneal signs BACK:  The back appears normal and is non-tender to palpation, there is no CVA tenderness EXT: Normal ROM in all joints; non-tender to palpation; no edema; normal capillary refill; no cyanosis, no calf tenderness or swelling; right basilic fistula in forearm that is nontender to palpation without erythema or warmth   SKIN: Normal color for age and race; warm; no rash NEURO: Moves all extremities equally, sensation to light touch intact diffusely, cranial nerves II through XII intact; normal gait PSYCH: The patient's mood and manner are appropriate. Grooming and personal hygiene are appropriate.  MEDICAL DECISION MAKING: Patient here with hypertension. He feels that is what is causing him to feel poorly, have intermittent vertigo and nausea and vomiting. Will treat his symptoms with IV Zofran and attempt to get his blood pressure down with IV hydralazine. He may need BP drip.  We will try to get a list of his accurate blood pressure medications. Will check labs, urine, chest x-ray, head CT, EKG to evaluate for any signs of end organ damage.  ED PROGRESS: 6:45 AM  Pt's mother now at bedside. They're able to confirm the medications that he takes. They state that he is on atorvastatin 20 mg at night, amlodipine 10 mg twice a day, clonidine 0.3 mg twice a day, spironolactone  25 mg once a day, Coreg 25 mg twice a day, Lasix 40 mg twice a day. No longer on hydralazine. Last took his nightly medications yesterday evening  just before vomiting. Last dose of clonidine was yesterday morning.  His EKG shows LVH and sinus tachycardia. Previously had T-wave inversions in inferior lateral leads that seemed to have improved.  7:05 AM  Pt's CXR shows mild cardiomegaly, no significant widened mediastinum, no significant edema. CT head shows no intracranial hemorrhage, mass, shift. Labs pending. Urine pending. Will start IV labetalol drip. Patient will likely need admission for hypertensive urgency versus emergency.  Signed out to Dr. Judd Lienelo.  Family and patient updated with plan.  I reviewed all nursing notes, vitals, pertinent old records, EKGs, labs, imaging (as available).    CRITICAL CARE Performed by: Raelyn NumberWARD, Maveryck Bahri N   Total critical care time: 35 minutes  Critical care time was exclusive of separately billable procedures and treating other patients.  Critical care was necessary to treat or prevent imminent or life-threatening deterioration.  Critical care was time spent personally by me on the following activities: development of treatment plan with patient and/or surrogate as well as nursing,  discussions with consultants, evaluation of patient's response to treatment, examination of patient, obtaining history from patient or surrogate, ordering and performing treatments and interventions, ordering and review of laboratory studies, ordering and review of radiographic studies, pulse oximetry and re-evaluation of patient's condition.     EKG Interpretation  Date/Time:  Friday February 01 2016 06:33:31 EDT Ventricular Rate:  117 PR Interval:    QRS Duration: 82 QT Interval:  286 QTC Calculation: 399 R Axis:   22 Text Interpretation:  Sinus tachycardia Left atrial enlargement Nonspecific repol abnormality, lateral leads ST elevation, consider anterior injury Baseline wander in lead(s) V2 T wave inversions in inferolateral leads have improved compared to prior EKG October 23 2015 Confirmed by [place],  DO, Brittnye Josephs  [phone]) on 02/01/2016 6:36:03 AM         Layla Maw Khushbu Pippen, DO 02/01/16 6045

## 2016-02-01 NOTE — ED Notes (Signed)
Pt is homeless, arrives with family. H/o HTN and HD. Attends HD T, TH, S. Last HD was on Tuesday (3d ago). HD catheter in R chest. Here for: dizziness, shaky, "feel hot", nv. Has vomited 10x in last 24 hrs. Last emesis at 0445. (denies: fever, pain, cough sob or other sx). Pt of [facility]. Pt alert, NAD, calm, interactive, resps e/u, speaking in clear complete sentences, no dyspnea noted, LS CTA, skin W&D, ambulatory with steady gait.

## 2016-02-01 NOTE — ED Notes (Signed)
[facility]family [facility] with plan. Pt to radiology via stretcher.

## 2016-02-05 NOTE — Progress Notes (Signed)
I was unable to reach patient by phone.  I left  A message on voice mail.  I instructed the patient to [facility] entrance at 0840  , nothing to eat or drink after midnight.   I instructed the patient to take the following medications in the am with just enough water to get them down: **if he still takes: Amlodipine, Carvedilol, Clonidine, Hydralazine...only those and to bring a current medication list in with him.  I asked patient to not wear any lotions, powders, cologne, jewelry, piercing, make-up or nail polish.  I asked the patient to call [phone], in the am if there were any questions or problems. I instructed patient that he needs someone to drive him home and to stay with him for the  24 hours after surgery.

## 2016-02-06 ENCOUNTER — Ambulatory Visit (HOSPITAL_COMMUNITY): Payer: Medicare Other | Admitting: Anesthesiology

## 2016-02-06 ENCOUNTER — Encounter (HOSPITAL_COMMUNITY): Payer: Self-pay | Admitting: *Deleted

## 2016-02-06 ENCOUNTER — Ambulatory Visit (HOSPITAL_COMMUNITY)
Admission: RE | Admit: 2016-02-06 | Discharge: 2016-02-06 | Disposition: A | Discharge status: Home / Self Care | Payer: Medicare Other | Source: Ambulatory Visit | Attending: Vascular Surgery | Admitting: Vascular Surgery

## 2016-02-06 ENCOUNTER — Encounter (HOSPITAL_COMMUNITY)
Admission: RE | Disposition: A | Discharge status: Home / Self Care | Payer: Self-pay | Source: Ambulatory Visit | Attending: Vascular Surgery

## 2016-02-06 DIAGNOSIS — I120 Hypertensive chronic kidney disease with stage 5 chronic kidney disease or end stage renal disease: Secondary | ICD-10-CM | POA: Insufficient documentation

## 2016-02-06 DIAGNOSIS — Z7982 Long term (current) use of aspirin: Secondary | ICD-10-CM | POA: Diagnosis not present

## 2016-02-06 DIAGNOSIS — N186 End stage renal disease: Secondary | ICD-10-CM | POA: Diagnosis not present

## 2016-02-06 DIAGNOSIS — N185 Chronic kidney disease, stage 5: Secondary | ICD-10-CM

## 2016-02-06 HISTORY — PX: AV FISTULA PLACEMENT: SHX1204

## 2016-02-06 HISTORY — DX: Hyperlipidemia, unspecified: E78.5

## 2016-02-06 HISTORY — DX: Pneumonia, unspecified organism: J18.9

## 2016-02-06 LAB — POCT I-STAT 4, (NA,K, GLUC, HGB,HCT)
Glucose, Bld: 83 mg/dL (ref 65–99)
HCT: 35 % — ABNORMAL LOW (ref 39.0–52.0)
HEMOGLOBIN: 11.9 g/dL — AB (ref 13.0–17.0)
Potassium: 5.4 mmol/L — ABNORMAL HIGH (ref 3.5–5.1)
Sodium: 141 mmol/L (ref 135–145)

## 2016-02-06 SURGERY — INSERTION OF ARTERIOVENOUS (AV) GORE-TEX GRAFT ARM
Anesthesia: General | Laterality: Right

## 2016-02-06 MED ORDER — HEPARIN SODIUM (PORCINE) 1000 UNIT/ML IJ SOLN
INTRAMUSCULAR | Status: DC | PRN
  Administered 2016-02-06: 5000 [IU] via INTRAVENOUS

## 2016-02-06 MED ORDER — LIDOCAINE 2% (20 MG/ML) 5 ML SYRINGE
INTRAMUSCULAR | Status: AC
  Filled 2016-02-06: qty 5

## 2016-02-06 MED ORDER — HEPARIN SODIUM (PORCINE) 1000 UNIT/ML IJ SOLN
INTRAMUSCULAR | Status: AC
  Filled 2016-02-06: qty 1

## 2016-02-06 MED ORDER — CARVEDILOL 12.5 MG PO TABS: 25.0000 mg | ORAL_TABLET | Freq: Once | ORAL | Status: DC

## 2016-02-06 MED ORDER — CARVEDILOL 12.5 MG PO TABS
ORAL_TABLET | ORAL | Status: AC
  Administered 2016-02-06: 25 mg
  Filled 2016-02-06: qty 2

## 2016-02-06 MED ORDER — FENTANYL CITRATE (PF) 100 MCG/2ML IJ SOLN
25.0000 ug | INTRAMUSCULAR | Status: DC | PRN
  Administered 2016-02-06: 25 ug via INTRAVENOUS

## 2016-02-06 MED ORDER — OXYCODONE-ACETAMINOPHEN 5-325 MG PO TABS: 1.0000 | ORAL_TABLET | Freq: Four times a day (QID) | ORAL | 0 refills | Status: DC | PRN

## 2016-02-06 MED ORDER — SODIUM CHLORIDE 0.9 % IV SOLN
INTRAVENOUS | Status: DC
  Administered 2016-02-06: 20 mL/h via INTRAVENOUS
  Administered 2016-02-06: 10:00:00 via INTRAVENOUS

## 2016-02-06 MED ORDER — CHLORHEXIDINE GLUCONATE CLOTH 2 % EX PADS: 6.0000 | MEDICATED_PAD | Freq: Once | CUTANEOUS | Status: DC

## 2016-02-06 MED ORDER — ONDANSETRON HCL 4 MG/2ML IJ SOLN: 4.0000 mg | Freq: Once | INTRAMUSCULAR | Status: DC | PRN

## 2016-02-06 MED ORDER — OXYCODONE HCL 5 MG PO TABS: 5.0000 mg | ORAL_TABLET | Freq: Once | ORAL | Status: DC | PRN

## 2016-02-06 MED ORDER — MIDAZOLAM HCL 5 MG/5ML IJ SOLN
INTRAMUSCULAR | Status: DC | PRN
  Administered 2016-02-06 (×2): 1 mg via INTRAVENOUS

## 2016-02-06 MED ORDER — ONDANSETRON HCL 4 MG/2ML IJ SOLN
INTRAMUSCULAR | Status: DC | PRN
  Administered 2016-02-06: 4 mg via INTRAVENOUS

## 2016-02-06 MED ORDER — MIDAZOLAM HCL 2 MG/2ML IJ SOLN
INTRAMUSCULAR | Status: AC
  Filled 2016-02-06: qty 2

## 2016-02-06 MED ORDER — SODIUM CHLORIDE 0.9 % IV SOLN
INTRAVENOUS | Status: DC | PRN
  Administered 2016-02-06: 500 mL

## 2016-02-06 MED ORDER — ONDANSETRON HCL 4 MG/2ML IJ SOLN
INTRAMUSCULAR | Status: AC
  Filled 2016-02-06: qty 2

## 2016-02-06 MED ORDER — FENTANYL CITRATE (PF) 100 MCG/2ML IJ SOLN
INTRAMUSCULAR | Status: DC
Start: 2016-02-06 — End: 2016-02-06
  Filled 2016-02-06: qty 2

## 2016-02-06 MED ORDER — FENTANYL CITRATE (PF) 100 MCG/2ML IJ SOLN
INTRAMUSCULAR | Status: DC | PRN
  Administered 2016-02-06 (×2): 25 ug via INTRAVENOUS
  Administered 2016-02-06: 50 ug via INTRAVENOUS

## 2016-02-06 MED ORDER — LIDOCAINE HCL (PF) 1 % IJ SOLN
INTRAMUSCULAR | Status: AC
  Filled 2016-02-06: qty 30

## 2016-02-06 MED ORDER — PROPOFOL 10 MG/ML IV BOLUS
INTRAVENOUS | Status: DC | PRN
  Administered 2016-02-06: 200 mg via INTRAVENOUS

## 2016-02-06 MED ORDER — CEFUROXIME SODIUM 1.5 G IJ SOLR
INTRAMUSCULAR | Status: AC
  Filled 2016-02-06: qty 1.5

## 2016-02-06 MED ORDER — FENTANYL CITRATE (PF) 250 MCG/5ML IJ SOLN
INTRAMUSCULAR | Status: AC
  Filled 2016-02-06: qty 5

## 2016-02-06 MED ORDER — OXYCODONE HCL 5 MG/5ML PO SOLN: 5.0000 mg | Freq: Once | ORAL | Status: DC | PRN

## 2016-02-06 MED ORDER — EPHEDRINE SULFATE-NACL 50-0.9 MG/10ML-% IV SOSY
PREFILLED_SYRINGE | INTRAVENOUS | Status: DC | PRN
  Administered 2016-02-06: 2.5 mg via INTRAVENOUS
  Administered 2016-02-06: 5 mg via INTRAVENOUS

## 2016-02-06 MED ORDER — LIDOCAINE 2% (20 MG/ML) 5 ML SYRINGE
INTRAMUSCULAR | Status: DC | PRN
  Administered 2016-02-06: 40 mg via INTRAVENOUS

## 2016-02-06 MED ORDER — EPHEDRINE 5 MG/ML INJ
INTRAVENOUS | Status: AC
  Filled 2016-02-06: qty 10

## 2016-02-06 MED ORDER — DEXTROSE 5 % IV SOLN
INTRAVENOUS | Status: DC | PRN
  Administered 2016-02-06: 25 ug/min via INTRAVENOUS

## 2016-02-06 MED ORDER — 0.9 % SODIUM CHLORIDE (POUR BTL) OPTIME
TOPICAL | Status: DC | PRN
Start: 2016-02-06 — End: 2016-02-06
  Administered 2016-02-06: 1000 mL

## 2016-02-06 MED ORDER — CEFUROXIME SODIUM 1.5 G IJ SOLR
1.5000 g | INTRAMUSCULAR | Status: AC
  Administered 2016-02-06: 1.5 g via INTRAVENOUS

## 2016-02-06 SURGICAL SUPPLY — 32 items
ARMBAND PINK RESTRICT EXTREMIT (MISCELLANEOUS) ×4 IMPLANT
CANISTER SUCTION 2500CC (MISCELLANEOUS) ×2 IMPLANT
CANNULA VESSEL 3MM 2 BLNT TIP (CANNULA) IMPLANT
CLIP TI MEDIUM 6 (CLIP) ×2 IMPLANT
CLIP TI WIDE RED SMALL 6 (CLIP) ×2 IMPLANT
COVER PROBE W GEL 5X96 (DRAPES) ×2 IMPLANT
DECANTER SPIKE VIAL GLASS SM (MISCELLANEOUS) ×2 IMPLANT
DERMABOND ADVANCED (GAUZE/BANDAGES/DRESSINGS) ×1
DERMABOND ADVANCED .7 DNX12 (GAUZE/BANDAGES/DRESSINGS) ×1 IMPLANT
ELECT REM PT RETURN 9FT ADLT (ELECTROSURGICAL) ×2
ELECTRODE REM PT RTRN 9FT ADLT (ELECTROSURGICAL) ×1 IMPLANT
GEL ULTRASOUND 20GR AQUASONIC (MISCELLANEOUS) IMPLANT
GLOVE BIO SURGEON STRL SZ7.5 (GLOVE) ×2 IMPLANT
GLOVE BIOGEL M 6.5 STRL (GLOVE) ×4 IMPLANT
GLOVE ECLIPSE 7.0 STRL STRAW (GLOVE) ×2 IMPLANT
GLOVE INDICATOR 7.5 STRL GRN (GLOVE) ×2 IMPLANT
GOWN STRL REUS W/ TWL LRG LVL3 (GOWN DISPOSABLE) ×3 IMPLANT
GOWN STRL REUS W/TWL LRG LVL3 (GOWN DISPOSABLE) ×3
GRAFT GORETEX STRT 4-7X45 (Vascular Products) ×2 IMPLANT
HEMOSTAT SPONGE AVITENE ULTRA (HEMOSTASIS) IMPLANT
KIT BASIN OR (CUSTOM PROCEDURE TRAY) ×2 IMPLANT
KIT ROOM TURNOVER OR (KITS) ×2 IMPLANT
LOOP VESSEL MINI RED (MISCELLANEOUS) ×2 IMPLANT
NS IRRIG 1000ML POUR BTL (IV SOLUTION) ×2 IMPLANT
PACK CV ACCESS (CUSTOM PROCEDURE TRAY) ×2 IMPLANT
PAD ARMBOARD 7.5X6 YLW CONV (MISCELLANEOUS) ×4 IMPLANT
SUT PROLENE 6 0 CC (SUTURE) ×8 IMPLANT
SUT VIC AB 3-0 SH 27 (SUTURE) ×2
SUT VIC AB 3-0 SH 27X BRD (SUTURE) ×2 IMPLANT
SUT VICRYL 4-0 PS2 18IN ABS (SUTURE) ×4 IMPLANT
UNDERPAD 30X30 (UNDERPADS AND DIAPERS) ×2 IMPLANT
WATER STERILE IRR 1000ML POUR (IV SOLUTION) ×2 IMPLANT

## 2016-02-06 NOTE — Anesthesia Preprocedure Evaluation (Signed)
Anesthesia Evaluation  Patient identified by MRN, date of birth, ID band Patient awake    Reviewed: Allergy & Precautions, NPO status , Patient's Chart, lab work & pertinent test results  Airway Mallampati: II  TM Distance: >3 FB Neck ROM: Full    Dental  (+) Teeth Intact, Dental Advisory Given   Pulmonary    breath sounds clear to auscultation       Cardiovascular hypertension,  Rhythm:Regular Rate:Normal     Neuro/Psych    GI/Hepatic   Endo/Other    Renal/GU      Musculoskeletal   Abdominal   Peds  Hematology   Anesthesia Other Findings   Reproductive/Obstetrics                             Anesthesia Physical Anesthesia Plan  ASA: III  Anesthesia Plan: General   Post-op Pain Management:    Induction: Intravenous  Airway Management Planned: LMA  Additional Equipment:   Intra-op Plan:   Post-operative Plan:   Informed Consent: I have reviewed the patients History and Physical, chart, labs and discussed the procedure including the risks, benefits and alternatives for the proposed anesthesia with the patient or authorized representative who has indicated his/her understanding and acceptance.   Dental advisory given  Plan Discussed with: CRNA and Anesthesiologist  Anesthesia Plan Comments:         Anesthesia Quick Evaluation  

## 2016-02-06 NOTE — Transfer of Care (Signed)
Immediate Anesthesia Transfer of Care Note  Patient: Joseph Villegas  Procedure(s) Performed: Procedure(s): INSERTION OF ARTERIOVENOUS (AV) GORE-TEX GRAFT ARM (Right)  Patient Location: PACU  Anesthesia Type:General  Level of Consciousness: awake, alert , oriented and patient cooperative  Airway & Oxygen Therapy: Patient Spontanous Breathing and Patient connected to nasal cannula oxygen  Post-op Assessment: Report given to RN and Post -op Vital signs reviewed and stable  Post vital signs: Reviewed and stable  Last Vitals:  Vitals:   02/06/16 0920  BP: (!) 155/89  Pulse: 62  Resp: 20  Temp: 36.7 C    Last Pain:  Vitals:   02/06/16 0920  TempSrc: Oral      Patients Stated Pain Goal: 3 (02/06/16 0935)  Complications: No apparent anesthesia complications

## 2016-02-06 NOTE — Anesthesia Procedure Notes (Signed)
Procedure Name: LMA Insertion Date/Time: 02/06/2016 11:26 AM Performed by: Annabelle HarmanSMITH, Florestine Carmical A Pre-anesthesia Checklist: Patient identified, Emergency Drugs available, Suction available and Patient being monitored Patient Re-evaluated:Patient Re-evaluated prior to inductionOxygen Delivery Method: Circle system utilized Intubation Type: IV induction Ventilation: Mask ventilation without difficulty LMA: LMA inserted LMA Size: 5.0 Number of attempts: 1 Placement Confirmation: positive ETCO2 and breath sounds checked- equal and bilateral Tube secured with: Tape Dental Injury: Teeth and Oropharynx as per pre-operative assessment

## 2016-02-06 NOTE — Anesthesia Postprocedure Evaluation (Signed)
Anesthesia Post Note  Patient: Joseph Villegas  Procedure(s) Performed: Procedure(s) (LRB): INSERTION OF ARTERIOVENOUS (AV) GORE-TEX GRAFT ARM (Right)  Patient location during evaluation: PACU Anesthesia Type: General Level of consciousness: awake, awake and alert and oriented Pain management: pain level controlled Vital Signs Assessment: post-procedure vital signs reviewed and stable Respiratory status: spontaneous breathing, nonlabored ventilation and respiratory function stable Cardiovascular status: blood pressure returned to baseline Anesthetic complications: no    Last Vitals:  Vitals:   02/06/16 1405 02/06/16 1415  BP: 122/67 112/65  Pulse: 67 75  Resp: 13 16  Temp: 36.6 C     Last Pain:  Vitals:   02/06/16 1415  TempSrc:   PainSc: 0-No pain                 Tobenna Needs COKER

## 2016-02-06 NOTE — H&P (View-Only) (Signed)
Patient is a 25 year old male who returns for follow-up today after placement of a right basilic vein fistula placed July 2017. It has been maturing slowly. He returns today for follow-up duplex scan. He is currently dialyzing via a right-sided catheter. Current dialysis today is Tuesday Thursday Saturday.  Current Outpatient Prescriptions on File Prior to Visit  Medication Sig Dispense Refill  . amLODipine (NORVASC) 10 MG tablet Take 1 tablet (10 mg total) by mouth daily. 30 tablet 1  . atorvastatin (LIPITOR) 20 MG tablet Take 1 tablet (20 mg total) by mouth daily at 6 PM. 30 tablet 1  . carvedilol (COREG) 25 MG tablet Take 25 mg by mouth 2 (two) times daily with a meal.    . cloNIDine (CATAPRES) 0.2 MG tablet Take 0.3 mg by mouth 2 (two) times daily.     . furosemide (LASIX) 40 MG tablet Take 40 mg by mouth. Twice daily    . hydrALAZINE (APRESOLINE) 100 MG tablet Take 1 tablet (100 mg total) by mouth every 8 (eight) hours. 90 tablet 1  . spironolactone (ALDACTONE) 25 MG tablet Take 1 tablet (25 mg total) by mouth daily. 30 tablet 2  . aspirin EC 325 MG tablet Take 325 mg by mouth daily as needed (for headaches).     No current facility-administered medications on file prior to visit.     Allergies  Allergen Reactions  . No Known Allergies    Past Medical History:  Diagnosis Date  . Chronic kidney disease   . Hypertension     Physical exam:  Vitals:   01/24/16 1452  BP: 107/79  Pulse: 70  Resp: 18  Temp: 98.6 F (37 C)  TempSrc: Oral  SpO2: 96%  Weight: 256 lb (116.1 kg)  Height: 5\' 10"  (1.778 m)    Right upper extremity: 2+ brachial radial pulse no audible bruit or palpable thrill in the fistula well-healed scars  Data: Duplex ultrasound today shows the fistula is occluded.  Assessment: Occluded AV fistula. Possible inflow narrowing versus poor quality vein.  Plan: At this point I think the patient needs a right upper arm AV graft. We may need to consider an upper arm  loop if the arterial flow was not adequate. Risks benefits possible, the patient's procedure details were discussed with the patient today. He will proceed with a right arm AV graft on 02/06/2016.  Fabienne Brunsharles Gayanne Prescott, MD [facility] Office: [phone] Pager: [phone]

## 2016-02-06 NOTE — Interval H&P Note (Signed)
History and Physical Interval Note:  02/06/2016 11:09 AM  Joseph Villegas  has presented today for surgery, with the diagnosis of chronic kidney disease  The various methods of treatment have been discussed with the patient and family. After consideration of risks, benefits and other options for treatment, the patient has consented to  Procedure(s): INSERTION OF ARTERIOVENOUS (AV) GORE-TEX GRAFT ARM (Right) as a surgical intervention .  The patient's history has been reviewed, patient examined, no change in status, stable for surgery.  I have reviewed the patient's chart and labs.  Questions were answered to the patient's satisfaction.     Fabienne BrunsFields, Kelcey Korus

## 2016-02-06 NOTE — OR Nursing (Signed)
Fentanyl 75 mcg wasted with Lauren RN.

## 2016-02-06 NOTE — Op Note (Signed)
Procedure: Right Upper Arm AV graft  Preop: ESRD  Postop: ESRD  Anesthesia: General  Asst : Lianne CureMaureen Collins PA-C  Findings:4-7 mm PTFE end to end to axillary vein, small brachial artery   Procedure Details: The right upper extremity was prepped and draped in usual sterile fashion.  A longitudinal incision was then made near the antecubital crease the right arm.  There were no suitable antecubital veins for outflow.   The incision was carried into the subcutaneous tissues down to level of the brachial artery.  Next the brachial artery was dissected free in the medial portion incision. The artery was  3 mm in diameter and fairly small. The vessel loops were placed proximal and distal to the planned site of arteriotomy. At this point, a longitudinal incision was made in the axilla and carried through the subcutaneous tissues and fascia to expose the axillary vein.  The nerves were protected.  The vein was approximately 6-7 mm in diameter. I did look at the adjacent artery and the caliber was no larger than the artery at the antecubital level so I decided to do a straight graft basing the inflow on the antecubital brachial artery.  Next, a subcutaneous tunnel was created connecting the upper arm to the lower arm incision in an arcing configuration over the biceps muscle.  A 4-7 mm PTFE graft was then brought through this subcutaneous tunnel. The patient was given 5000 units of intravenous heparin. After appropriate circulation time, the vessel loops were used to control the artery. A longitudinal opening was made in the right brachial artery.  The 4 mm end of the graft was beveled and sewn end to side to the artery using a 6 0 prolene.  At completion of the anastomosis the artery was forward bled, backbled and thoroughly flushed.  The anastomosis was secured, vessel loops were released and there was palpable pulse in the graft.  The graft was clamped just above the arterial anastomosis with a fistula  clamp. The graft was then pulled taut to length at the axillary incision.  The axillary vein was controlled with a fine bulldog clamp in the upper axilla proximally and distally.  The vein was opened longitudinally.  The distal end of the graft was then beveled and sewn end to side to the vein using a running 6 0 prolene.  Just prior to completion of the anastomosis, everything was forward bled, back bled and thoroughly flushed.  The anastomosis was secured and the fistula clamp removed from the proximal graft.  A thrill was immediately palpable in the graft. After hemostasis was obtained, the subcutaneous tissues were reapproximated using a running 3-0 Vicryl suture. The skin was then closed with a 4 0 Vicryl subcuticular stitch. Dermabond was applied to the skin incisions.  The patient tolerated the procedure well and there were no complications.  Instrument sponge and needle count was correct at the end of the case.  The patient was taken to the recovery room in stable condition. The patient had audible radial doppler signal at the end of the case.  Fabienne Brunsharles Zaivion Kundrat, MD [facility] [place] Office: [phone] Pager: [phone]

## 2016-02-07 ENCOUNTER — Encounter (HOSPITAL_COMMUNITY): Payer: Self-pay | Admitting: Vascular Surgery

## 2016-03-25 ENCOUNTER — Other Ambulatory Visit: Payer: Self-pay

## 2016-03-26 ENCOUNTER — Encounter (HOSPITAL_COMMUNITY): Payer: Self-pay | Admitting: *Deleted

## 2016-03-26 NOTE — Progress Notes (Signed)
Patient verbalized understanding of pre-op instructions.  Instructed to stop any NSAIDS.  Patient had an abnormal EKG back in October will send to anesthesia for review of EKG and Echo

## 2016-03-27 NOTE — Progress Notes (Signed)
Anesthesia chart review: Patient is a 25 year old male scheduled for thrombectomy right arm arteriovenous graft, possible revision on 03/28/2016 by Dr. Edilia Boickson.  History includes end-stage renal disease (HD TTS; R IJ Permcath), nonsmoker, hypertension, hyperlipidemia. He is s/p right basilic vein transposition, first stage on 10/29/15 and s/p insertion of RUE AVGG 02/06/16. No PCP listed. Nephrologist is Dr. Lequita HaltLufadeju.   Meds include carvedilol, clonidine, Lasix, lisinopril, losartan, Protonix, Aldactone.  He saw [facility] during 10/2015 hospitalization for evaluation of chest pain and elevated troponin in the setting of malignant hypertension, acute pneumonitis and acute kidney injury. Last note on 10/26/15 was by Dr. Eden EmmsNishan. Mildly elevated troponin was not felt significant in the setting of renal failure. EF normal by echo, but with severe LVH. Symptoms resolved following in/out Foley catheterization and GERD treatment. Meds adjusted for HTN.   02/01/16 EKG: ST at 117, left atrial enlargement, nonspecific repolarization abnormality, lateral leads. ST elevation, consider anterior 3. Baseline wanderer in and V2. T-wave inversions in inferior lateral leads have improved compared to prior EKG on 10/23/2015.  02/01/16 Echo [facility]; Care Everywhere): Summary Ejection fraction is visually estimated at >70% Hyperdynamic left ventricle with Moderate to severe LVH Normal right ventricular size and function. No significant valvular abnormalities (Comparison echo 714/17: Severe LVH, EF 65-70%, normal wall motion and no regional wall motion abnormalities. Grade 1 diastolic dysfunction. Moderately dilated LA.)  02/01/16 CXR: Impression: No active disease.  He is for labs and anesthesiology evaluation on arrival to ensure no acute changes prior to proceeding.  Velna Ochsllison Ekam Besson, PA-C [facility]Anesthesiology Phone [phone] 03/27/2016 9:45 AM

## 2016-03-28 ENCOUNTER — Encounter (HOSPITAL_COMMUNITY)
Admission: RE | Disposition: A | Discharge status: Home / Self Care | Payer: Self-pay | Source: Ambulatory Visit | Attending: Vascular Surgery

## 2016-03-28 ENCOUNTER — Ambulatory Visit (HOSPITAL_COMMUNITY): Payer: Medicare Other | Admitting: Vascular Surgery

## 2016-03-28 ENCOUNTER — Ambulatory Visit (HOSPITAL_COMMUNITY)
Admission: RE | Admit: 2016-03-28 | Discharge: 2016-03-28 | Disposition: A | Discharge status: Home / Self Care | Payer: Medicare Other | Source: Ambulatory Visit | Attending: Vascular Surgery | Admitting: Vascular Surgery

## 2016-03-28 ENCOUNTER — Encounter (HOSPITAL_COMMUNITY): Payer: Self-pay | Admitting: Certified Registered Nurse Anesthetist

## 2016-03-28 DIAGNOSIS — E785 Hyperlipidemia, unspecified: Secondary | ICD-10-CM | POA: Diagnosis not present

## 2016-03-28 DIAGNOSIS — Z992 Dependence on renal dialysis: Secondary | ICD-10-CM | POA: Insufficient documentation

## 2016-03-28 DIAGNOSIS — E669 Obesity, unspecified: Secondary | ICD-10-CM | POA: Diagnosis not present

## 2016-03-28 DIAGNOSIS — Y832 Surgical operation with anastomosis, bypass or graft as the cause of abnormal reaction of the patient, or of later complication, without mention of misadventure at the time of the procedure: Secondary | ICD-10-CM | POA: Insufficient documentation

## 2016-03-28 DIAGNOSIS — Z79899 Other long term (current) drug therapy: Secondary | ICD-10-CM | POA: Diagnosis not present

## 2016-03-28 DIAGNOSIS — Z8249 Family history of ischemic heart disease and other diseases of the circulatory system: Secondary | ICD-10-CM | POA: Diagnosis not present

## 2016-03-28 DIAGNOSIS — I1311 Hypertensive heart and chronic kidney disease without heart failure, with stage 5 chronic kidney disease, or end stage renal disease: Secondary | ICD-10-CM | POA: Diagnosis not present

## 2016-03-28 DIAGNOSIS — Z6836 Body mass index (BMI) 36.0-36.9, adult: Secondary | ICD-10-CM | POA: Diagnosis not present

## 2016-03-28 DIAGNOSIS — N186 End stage renal disease: Secondary | ICD-10-CM | POA: Insufficient documentation

## 2016-03-28 DIAGNOSIS — T82868A Thrombosis of vascular prosthetic devices, implants and grafts, initial encounter: Secondary | ICD-10-CM | POA: Insufficient documentation

## 2016-03-28 HISTORY — PX: AV FISTULA PLACEMENT: SHX1204

## 2016-03-28 LAB — POCT I-STAT 4, (NA,K, GLUC, HGB,HCT)
GLUCOSE: 97 mg/dL (ref 65–99)
HEMATOCRIT: 37 % — AB (ref 39.0–52.0)
Hemoglobin: 12.6 g/dL — ABNORMAL LOW (ref 13.0–17.0)
Potassium: 4.8 mmol/L (ref 3.5–5.1)
Sodium: 138 mmol/L (ref 135–145)

## 2016-03-28 SURGERY — INSERTION OF ARTERIOVENOUS (AV) GORE-TEX GRAFT ARM
Anesthesia: General | Site: Arm Upper | Laterality: Right

## 2016-03-28 MED ORDER — EPHEDRINE SULFATE 50 MG/ML IJ SOLN
INTRAMUSCULAR | Status: DC | PRN
  Administered 2016-03-28 (×2): 5 mg via INTRAVENOUS

## 2016-03-28 MED ORDER — FENTANYL CITRATE (PF) 100 MCG/2ML IJ SOLN
INTRAMUSCULAR | Status: AC
  Filled 2016-03-28: qty 2

## 2016-03-28 MED ORDER — FENTANYL CITRATE (PF) 100 MCG/2ML IJ SOLN
25.0000 ug | INTRAMUSCULAR | Status: DC | PRN
  Administered 2016-03-28: 50 ug via INTRAVENOUS

## 2016-03-28 MED ORDER — OXYCODONE-ACETAMINOPHEN 5-325 MG PO TABS: 1.0000 | ORAL_TABLET | Freq: Four times a day (QID) | ORAL | 0 refills | Status: DC | PRN

## 2016-03-28 MED ORDER — DEXTROSE 5 % IV SOLN
1.5000 g | INTRAVENOUS | Status: AC
  Administered 2016-03-28: 1.5 g via INTRAVENOUS

## 2016-03-28 MED ORDER — 0.9 % SODIUM CHLORIDE (POUR BTL) OPTIME
TOPICAL | Status: DC | PRN
  Administered 2016-03-28: 1000 mL

## 2016-03-28 MED ORDER — THROMBIN 20000 UNITS EX SOLR
CUTANEOUS | Status: AC
  Filled 2016-03-28: qty 20000

## 2016-03-28 MED ORDER — PHENYLEPHRINE HCL 10 MG/ML IJ SOLN
INTRAMUSCULAR | Status: DC | PRN
  Administered 2016-03-28: 40 ug via INTRAVENOUS
  Administered 2016-03-28 (×3): 80 ug via INTRAVENOUS

## 2016-03-28 MED ORDER — HEPARIN SODIUM (PORCINE) 1000 UNIT/ML IJ SOLN
INTRAMUSCULAR | Status: DC | PRN
  Administered 2016-03-28: 9 mL via INTRAVENOUS

## 2016-03-28 MED ORDER — CHLORHEXIDINE GLUCONATE CLOTH 2 % EX PADS: 6.0000 | MEDICATED_PAD | Freq: Once | CUTANEOUS | Status: DC

## 2016-03-28 MED ORDER — PROPOFOL 10 MG/ML IV BOLUS
INTRAVENOUS | Status: AC
  Filled 2016-03-28: qty 20

## 2016-03-28 MED ORDER — PROPOFOL 10 MG/ML IV BOLUS
INTRAVENOUS | Status: DC | PRN
  Administered 2016-03-28: 150 mg via INTRAVENOUS

## 2016-03-28 MED ORDER — LIDOCAINE HCL (PF) 1 % IJ SOLN
INTRAMUSCULAR | Status: AC
Start: 2016-03-28 — End: 2016-03-28
  Filled 2016-03-28: qty 30

## 2016-03-28 MED ORDER — HEPARIN SODIUM (PORCINE) 5000 UNIT/ML IJ SOLN
INTRAMUSCULAR | Status: DC | PRN
  Administered 2016-03-28: 09:00:00 500 mL

## 2016-03-28 MED ORDER — DEXTROSE 5 % IV SOLN
INTRAVENOUS | Status: AC
  Filled 2016-03-28: qty 1.5

## 2016-03-28 MED ORDER — MIDAZOLAM HCL 2 MG/2ML IJ SOLN
INTRAMUSCULAR | Status: AC
  Filled 2016-03-28: qty 2

## 2016-03-28 MED ORDER — MIDAZOLAM HCL 5 MG/5ML IJ SOLN
INTRAMUSCULAR | Status: DC | PRN
  Administered 2016-03-28: 2 mg via INTRAVENOUS

## 2016-03-28 MED ORDER — ONDANSETRON HCL 4 MG/2ML IJ SOLN: 4.0000 mg | Freq: Once | INTRAMUSCULAR | Status: DC | PRN

## 2016-03-28 MED ORDER — PAPAVERINE HCL 30 MG/ML IJ SOLN
INTRAMUSCULAR | Status: DC | PRN
  Administered 2016-03-28: 2 mL via INTRAVENOUS

## 2016-03-28 MED ORDER — PROTAMINE SULFATE 10 MG/ML IV SOLN
INTRAVENOUS | Status: DC | PRN
  Administered 2016-03-28: 40 mg via INTRAVENOUS

## 2016-03-28 MED ORDER — ONDANSETRON HCL 4 MG/2ML IJ SOLN
INTRAMUSCULAR | Status: DC | PRN
  Administered 2016-03-28: 4 mg via INTRAVENOUS

## 2016-03-28 MED ORDER — DEXAMETHASONE SODIUM PHOSPHATE 10 MG/ML IJ SOLN
INTRAMUSCULAR | Status: DC | PRN
  Administered 2016-03-28: 10 mg via INTRAVENOUS

## 2016-03-28 MED ORDER — PAPAVERINE HCL 30 MG/ML IJ SOLN
INTRAMUSCULAR | Status: AC
  Filled 2016-03-28: qty 2

## 2016-03-28 MED ORDER — FENTANYL CITRATE (PF) 100 MCG/2ML IJ SOLN
INTRAMUSCULAR | Status: DC | PRN
  Administered 2016-03-28 (×2): 25 ug via INTRAVENOUS
  Administered 2016-03-28: 100 ug via INTRAVENOUS

## 2016-03-28 MED ORDER — HEPARIN SODIUM (PORCINE) 1000 UNIT/ML IJ SOLN
INTRAMUSCULAR | Status: AC
  Filled 2016-03-28: qty 1

## 2016-03-28 MED ORDER — SODIUM CHLORIDE 0.9 % IV SOLN
INTRAVENOUS | Status: DC
  Administered 2016-03-28: 10 mL/h via INTRAVENOUS
  Administered 2016-03-28: 10:00:00 via INTRAVENOUS

## 2016-03-28 SURGICAL SUPPLY — 35 items
ARMBAND PINK RESTRICT EXTREMIT (MISCELLANEOUS) ×3 IMPLANT
CANISTER SUCTION 2500CC (MISCELLANEOUS) ×3 IMPLANT
CANNULA VESSEL 3MM 2 BLNT TIP (CANNULA) ×6 IMPLANT
CATH EMB 4FR 80CM (CATHETERS) IMPLANT
CLIP TI MEDIUM 6 (CLIP) ×3 IMPLANT
CLIP TI WIDE RED SMALL 6 (CLIP) ×3 IMPLANT
DERMABOND ADVANCED (GAUZE/BANDAGES/DRESSINGS) ×2
DERMABOND ADVANCED .7 DNX12 (GAUZE/BANDAGES/DRESSINGS) ×1 IMPLANT
DRAPE X-RAY CASS 24X20 (DRAPES) IMPLANT
ELECT REM PT RETURN 9FT ADLT (ELECTROSURGICAL) ×3
ELECTRODE REM PT RTRN 9FT ADLT (ELECTROSURGICAL) ×1 IMPLANT
GAUZE SPONGE 4X4 16PLY XRAY LF (GAUZE/BANDAGES/DRESSINGS) IMPLANT
GEL ULTRASOUND 20GR AQUASONIC (MISCELLANEOUS) IMPLANT
GLOVE BIO SURGEON STRL SZ7.5 (GLOVE) ×3 IMPLANT
GLOVE BIOGEL PI IND STRL 8 (GLOVE) ×1 IMPLANT
GLOVE BIOGEL PI INDICATOR 8 (GLOVE) ×2
GOWN STRL REUS W/ TWL LRG LVL3 (GOWN DISPOSABLE) ×3 IMPLANT
GOWN STRL REUS W/TWL LRG LVL3 (GOWN DISPOSABLE) ×6
GRAFT GORETEX STRT 4-7X45 (Vascular Products) ×3 IMPLANT
KIT BASIN OR (CUSTOM PROCEDURE TRAY) ×3 IMPLANT
KIT ROOM TURNOVER OR (KITS) ×3 IMPLANT
NS IRRIG 1000ML POUR BTL (IV SOLUTION) ×3 IMPLANT
PACK CV ACCESS (CUSTOM PROCEDURE TRAY) ×3 IMPLANT
PAD ARMBOARD 7.5X6 YLW CONV (MISCELLANEOUS) ×6 IMPLANT
SET COLLECT BLD 21X3/4 12 (NEEDLE) IMPLANT
SPONGE SURGIFOAM ABS GEL 100 (HEMOSTASIS) IMPLANT
STOPCOCK 4 WAY LG BORE MALE ST (IV SETS) IMPLANT
SUT PROLENE 6 0 BV (SUTURE) ×9 IMPLANT
SUT VIC AB 3-0 SH 27 (SUTURE) ×4
SUT VIC AB 3-0 SH 27X BRD (SUTURE) ×2 IMPLANT
SUT VICRYL 4-0 PS2 18IN ABS (SUTURE) ×6 IMPLANT
SYR 20CC LL (SYRINGE) ×3 IMPLANT
TUBING EXTENTION W/L.L. (IV SETS) IMPLANT
UNDERPAD 30X30 (UNDERPADS AND DIAPERS) ×3 IMPLANT
WATER STERILE IRR 1000ML POUR (IV SOLUTION) ×3 IMPLANT

## 2016-03-28 NOTE — Transfer of Care (Signed)
Immediate Anesthesia Transfer of Care Note  Patient: Joseph Villegas  Procedure(s) Performed: Procedure(s): INSERTION OF ARTERIOVENOUS (AV) GORE-TEX GRAFT ARM (Right)  Patient Location: PACU  Anesthesia Type:General  Level of Consciousness: awake and alert   Airway & Oxygen Therapy: Patient Spontanous Breathing and Patient connected to nasal cannula oxygen  Post-op Assessment: Report given to RN and Post -op Vital signs reviewed and stable  Post vital signs: Reviewed and stable  Last Vitals:  Vitals:   03/28/16 0654 03/28/16 1137  BP: 127/75   Pulse: 99   Resp: 18   Temp:  (P) 36.4 C    Last Pain:  Vitals:   03/28/16 0654  TempSrc: Oral      Patients Stated Pain Goal: 4 (03/28/16 0707)  Complications: No apparent anesthesia complications

## 2016-03-28 NOTE — Anesthesia Preprocedure Evaluation (Addendum)
Anesthesia Evaluation  Patient identified by MRN, date of birth, ID band Patient awake    Reviewed: Allergy & Precautions, NPO status , Patient's Chart, lab work & pertinent test results, reviewed documented beta blocker date and time   Airway Mallampati: III  TM Distance: >3 FB Neck ROM: Full    Dental  (+) Teeth Intact, Dental Advisory Given   Pulmonary    Pulmonary exam normal        Cardiovascular hypertension, Pt. on medications Normal cardiovascular exam     Neuro/Psych    GI/Hepatic   Endo/Other    Renal/GU ESRF and DialysisRenal disease     Musculoskeletal   Abdominal (+) + obese,   Peds  Hematology   Anesthesia Other Findings   Reproductive/Obstetrics                                                             Anesthesia Evaluation  Patient identified by MRN, date of birth, ID band Patient awake    Reviewed: Allergy & Precautions, NPO status , Patient's Chart, lab work & pertinent test results  Airway Mallampati: II  TM Distance: >3 FB Neck ROM: Full    Dental  (+) Teeth Intact, Dental Advisory Given   Pulmonary    breath sounds clear to auscultation       Cardiovascular hypertension,  Rhythm:Regular Rate:Normal     Neuro/Psych    GI/Hepatic   Endo/Other    Renal/GU      Musculoskeletal   Abdominal   Peds  Hematology   Anesthesia Other Findings   Reproductive/Obstetrics                             Anesthesia Physical Anesthesia Plan  ASA: III  Anesthesia Plan: General   Post-op Pain Management:    Induction: Intravenous  Airway Management Planned: LMA  Additional Equipment:   Intra-op Plan:   Post-operative Plan:   Informed Consent: I have reviewed the patients History and Physical, chart, labs and discussed the procedure including the risks, benefits and alternatives for the proposed anesthesia  with the patient or authorized representative who has indicated his/her understanding and acceptance.   Dental advisory given  Plan Discussed with: CRNA and Anesthesiologist  Anesthesia Plan Comments:         Anesthesia Quick Evaluation                                   Anesthesia Evaluation  Patient identified by MRN, date of birth, ID band Patient awake    Reviewed: Allergy & Precautions, NPO status , Patient's Chart, lab work & pertinent test results  Airway Mallampati: II  TM Distance: >3 FB Neck ROM: Full    Dental  (+) Teeth Intact, Dental Advisory Given   Pulmonary    breath sounds clear to auscultation       Cardiovascular hypertension,  Rhythm:Regular Rate:Normal     Neuro/Psych    GI/Hepatic   Endo/Other    Renal/GU      Musculoskeletal   Abdominal   Peds  Hematology   Anesthesia Other Findings   Reproductive/Obstetrics  Anesthesia Physical Anesthesia Plan  ASA: III  Anesthesia Plan: General   Post-op Pain Management:    Induction: Intravenous  Airway Management Planned: LMA  Additional Equipment:   Intra-op Plan:   Post-operative Plan:   Informed Consent: I have reviewed the patients History and Physical, chart, labs and discussed the procedure including the risks, benefits and alternatives for the proposed anesthesia with the patient or authorized representative who has indicated his/her understanding and acceptance.   Dental advisory given  Plan Discussed with: CRNA and Anesthesiologist  Anesthesia Plan Comments:         Anesthesia Quick Evaluation  Anesthesia Physical Anesthesia Plan  ASA: II  Anesthesia Plan: General   Post-op Pain Management:    Induction: Intravenous  Airway Management Planned: LMA  Additional Equipment:   Intra-op Plan:   Post-operative Plan: Extubation in OR  Informed Consent: I have reviewed the patients  History and Physical, chart, labs and discussed the procedure including the risks, benefits and alternatives for the proposed anesthesia with the patient or authorized representative who has indicated his/her understanding and acceptance.   Dental advisory given  Plan Discussed with: CRNA, Anesthesiologist and Surgeon  Anesthesia Plan Comments:       Anesthesia Quick Evaluation

## 2016-03-28 NOTE — Op Note (Signed)
    NAME: [name]  MRN: [id number] DOB: Dec 27, 1990    DATE OF OPERATION: 03/28/2016  PREOP DIAGNOSIS: Clotted right upper arm AV graft  POSTOP DIAGNOSIS: Same  PROCEDURE: Placement of new upper arm right AV graft (4-7 mm PTFE graft)  SURGEON: Di Kindlehristopher S. Edilia Boickson, MD, FACS  ASSIST: Karsten RoKim Trinh, [facility]  ANESTHESIA: Gen.   EBL: Minimal  INDICATIONS: [name] is a 25 y.o. male who had a new right upper arm graft placed a month ago. This has clotted twice. He has undergone thrombolysis at another institution and the graft clotted immediately. There were apparently problems noted at both the arterial and venous ends. I elected to place a new graft.  FINDINGS: Small upper arm brachial vein.  TECHNIQUE: The patient was taken to the operating room and received a general anesthetic. The right upper extremity was prepped and draped in usual sterile fashion. A transverse incision was made at the axilla and here the old venous anastomosis was dissected free. This was an end to side anastomosis. The adjacent brachial artery was dissected free. It was somewhat small. I did interrogate with the Doppler to be sure that this was in fact the brachial artery. A 4-7 mm PTFE graft was then tunneled in a loop fashion in the upper arm. The arterial aspect of the graft was along the lateral aspect of the upper arm. The patient was then heparinized. The brachial artery was then clamped proximally and distally and a longitudinal arteriotomy was made. A segment of the 4 mm end of the graft was excised, the graft slightly spatulated, and sewn end-to-side to the brachial artery using continuous 6-0 Prolene suture. The grafts and poorly properly length for anastomosis to the axillary vein. The vein was divided and then opened longitudinally. The graft was cut to the appropriate length, spatulated, and sewn end to end to the vein. At the completion was a good thrill in the graft. There was a  radial signal with the Doppler at the completion. The heparin was partially reversed with protamine. Wounds were closed with deep 3-0 Vicryl and the skin closed with 4-0 Vicryl. Sterile dressing was applied. The patient tolerated the procedure well and was transferred to the recovery room in stable condition. All needle and sponge counts were correct.  Waverly Ferrarihristopher Yula Crotwell, MD, FACS [facility]  DATE OF DICTATION:   03/28/2016

## 2016-03-28 NOTE — Addendum Note (Signed)
Addendum  created 03/28/16 1819 by Reine Justokoshi T Dorthea Maina, CRNA   Anesthesia Event edited

## 2016-03-28 NOTE — Anesthesia Postprocedure Evaluation (Signed)
Anesthesia Post Note  Patient: Joseph Villegas  Procedure(s) Performed: Procedure(s) (LRB): INSERTION OF ARTERIOVENOUS (AV) GORE-TEX GRAFT ARM (Right)  Patient location during evaluation: PACU Anesthesia Type: General Level of consciousness: awake and sedated Pain management: pain level controlled Vital Signs Assessment: post-procedure vital signs reviewed and stable Respiratory status: spontaneous breathing Cardiovascular status: stable Postop Assessment: no signs of nausea or vomiting Anesthetic complications: no        Last Vitals:  Vitals:   03/28/16 0654 03/28/16 1137  BP: 127/75   Pulse: 99   Resp: 18   Temp:  36.4 C    Last Pain:  Vitals:   03/28/16 1137  TempSrc:   PainSc: Asleep   Pain Goal: Patients Stated Pain Goal: 4 (03/28/16 0707)               Joseph Villegas,Joseph Villegas

## 2016-03-28 NOTE — Anesthesia Procedure Notes (Signed)
Procedure Name: LMA Insertion Date/Time: 03/28/2016 9:52 AM Performed by: Reine JustFLOWERS, Bakary Bramer T Pre-anesthesia Checklist: Patient identified, Emergency Drugs available, Suction available, Patient being monitored and Timeout performed Patient Re-evaluated:Patient Re-evaluated prior to inductionOxygen Delivery Method: Circle system utilized and Simple face mask Preoxygenation: Pre-oxygenation with 100% oxygen Intubation Type: IV induction Ventilation: Mask ventilation without difficulty LMA: LMA inserted LMA Size: 5.0 Number of attempts: 1 Airway Equipment and Method: Patient positioned with wedge pillow Placement Confirmation: positive ETCO2 and breath sounds checked- equal and bilateral Tube secured with: Tape Dental Injury: Teeth and Oropharynx as per pre-operative assessment

## 2016-03-28 NOTE — H&P (Signed)
Patient name: Joseph Villegas MRN: [id number] DOB: [birth year] Sex: male  REASON FOR Admission: Thrombectomy of right arm graft  HPI: Joseph Villegas is a 25 y.o. male who I saw in consultation on 10/25/2015 for hemodialysis access. On 02/06/2016, the patient had a right upper arm graft placed with a 4-7 mm PTFE graft. The patient had a small brachial artery. The venous anastomosis was end to end to the axillary vein. The patient presents with a clotted right upper arm graft. He is not sure when the graft clotted. Apparently he was not considered to be a candidate for thrombolysis because of his recent surgery. He was set up for a thrombectomy.  He dialyzes on Tuesdays Thursdays and Saturdays.  He has a right IJ tunneled dialysis catheter. He denies any recent uremic symptoms.  Past Medical History:  Diagnosis Date  . Chronic kidney disease   . Hyperlipidemia   . Hypertension   . Pneumonia    hx of     Family History  Problem Relation Age of Onset  . Hypertension Mother   . Diabetes Mother   . Hypertension Brother   . Diabetes Sister   . CAD Father     SOCIAL HISTORY: Social History  Substance Use Topics  . Smoking status: Never Smoker  . Smokeless tobacco: Never Used  . Alcohol use No    No Known Allergies  Current Facility-Administered Medications  Medication Dose Route Frequency Provider Last Rate Last Dose  . 0.9 %  sodium chloride infusion   Intravenous Continuous Chuck Hinthristopher S Gisele Pack, MD 10 mL/hr at 03/28/16 0726 10 mL/hr at 03/28/16 0726  . cefUROXime (ZINACEF) 1.5 g in dextrose 5 % 50 mL IVPB  1.5 g Intravenous 30 min Pre-Op Chuck Hinthristopher S Layten Aiken, MD      . Chlorhexidine Gluconate Cloth 2 % PADS 6 each  6 each Topical Once Chuck Hinthristopher S Miroslav Gin, MD      . dextrose 5 % with cefUROXime (ZINACEF) ADS Med             REVIEW OF SYSTEMS:  [X]  denotes positive finding, [ ]  denotes negative finding Cardiac  Comments:  Chest pain or chest pressure:     Shortness of breath upon exertion:    Short of breath when lying flat:    Irregular heart rhythm:        Vascular    Pain in calf, thigh, or hip brought on by ambulation:    Pain in feet at night that wakes you up from your sleep:     Blood clot in your veins:    Leg swelling:         Pulmonary    Oxygen at home:    Productive cough:     Wheezing:         Neurologic    Sudden weakness in arms or legs:     Sudden numbness in arms or legs:     Sudden onset of difficulty speaking or slurred speech:    Temporary loss of vision in one eye:     Problems with dizziness:         Gastrointestinal    Blood in stool:     Vomited blood:         Genitourinary    Burning when urinating:     Blood in urine:        Psychiatric    Major depression:         Hematologic  Bleeding problems:    Problems with blood clotting too easily:        Skin    Rashes or ulcers:        Constitutional    Fever or chills:      PHYSICAL EXAM: Vitals:   03/28/16 0654 03/28/16 0707  BP: 127/75   Pulse: 99   Resp: 18   TempSrc: Oral   SpO2: 99%   Weight:  251 lb (113.9 kg)    GENERAL: The patient is a well-nourished male, in no acute distress. The vital signs are documented above. CARDIAC: There is a regular rate and rhythm.  VASCULAR: No thrill in right upper arm AVG. PULMONARY: There is good air exchange bilaterally without wheezing or rales. ABDOMEN: Soft and non-tender with normal pitched bowel sounds.  MUSCULOSKELETAL: There are no major deformities or cyanosis. NEUROLOGIC: No focal weakness or paresthesias are detected. SKIN: There are no ulcers or rashes noted. PSYCHIATRIC: The patient has a normal affect.  MEDICAL ISSUES:  CLOTTED RIGHT UPPER ARM AVG: Will attempt thrombectomy of graft. I have explained that this graft may not be salvageable. This graft was placed 1 month ago and is already clotted twice. He underwent an intervention at an outlying facility where reportedly  there was stenosis at the venous anastomosis and the arterial anastomosis. The graft immediately clotted after this. I will attempt thrombectomy or possibly place a new graft. If we are not successful he does have a functioning catheter and he would have to come in as an outpatient to be evaluated for new access.    Waverly Ferrariickson, Lyndsay Talamante [facility] [place] [facility] [phone]

## 2016-04-01 ENCOUNTER — Encounter (HOSPITAL_COMMUNITY): Payer: Self-pay | Admitting: Vascular Surgery

## 2016-04-09 ENCOUNTER — Ambulatory Visit (INDEPENDENT_AMBULATORY_CARE_PROVIDER_SITE_OTHER): Payer: Medicare Other | Admitting: Vascular Surgery

## 2016-04-09 ENCOUNTER — Encounter: Payer: Self-pay | Admitting: Vascular Surgery

## 2016-04-09 VITALS — BP 152/89 | HR 79 | Temp 98.4°F | Resp 16 | Ht 70.0 in | Wt 258.0 lb

## 2016-04-09 DIAGNOSIS — Z48812 Encounter for surgical aftercare following surgery on the circulatory system: Secondary | ICD-10-CM

## 2016-04-09 NOTE — Progress Notes (Signed)
   Patient name: Joseph Villegas MRN: [id number] DOB: 02-06-1991 Sex: male  REASON FOR VISIT: Follow up after AV graft.  HPI: Joseph Villegas is a 26 y.o. male who had a new upper arm AV graft placed on 03/28/2016. She had a small upper arm brachial vein and I was concerned about steal so brought her in for a follow up visit. Today, he has no complaints. He denies pain or paresthesias in his right arm.    Current Outpatient Prescriptions  Medication Sig Dispense Refill  . carvedilol (COREG) 25 MG tablet Take 25 mg by mouth 2 (two) times daily.    . cloNIDine (CATAPRES) 0.3 MG tablet Take 0.3 mg by mouth 2 (two) times daily.    . furosemide (LASIX) 40 MG tablet Take 40 mg by mouth 2 (two) times daily.     Marland Kitchen. lisinopril (PRINIVIL,ZESTRIL) 40 MG tablet Take 40 mg by mouth daily.    Marland Kitchen. losartan (COZAAR) 100 MG tablet Take 100 mg by mouth daily.  0  . pantoprazole (PROTONIX) 40 MG tablet Take 40 mg by mouth daily.    Marland Kitchen. spironolactone (ALDACTONE) 25 MG tablet Take 1 tablet (25 mg total) by mouth daily. 30 tablet 2   No current facility-administered medications for this visit.     REVIEW OF SYSTEMS:  [X]  denotes positive finding, [ ]  denotes negative finding Cardiac  Comments:  Chest pain or chest pressure:    Shortness of breath upon exertion:    Short of breath when lying flat:    Irregular heart rhythm:    Constitutional    Fever or chills:      PHYSICAL EXAM: Vitals:   04/09/16 1031  BP: (!) 152/89  Pulse: 79  Resp: 16  Temp: 98.4 F (36.9 C)  TempSrc: Oral  SpO2: 100%  Weight: 258 lb (117 kg)  Height: 5\' 10"  (1.778 m)    GENERAL: The patient is a well-nourished male, in no acute distress. The vital signs are documented above. CARDIOVASCULAR: There is a regular rate and rhythm. PULMONARY: There is good air exchange bilaterally without wheezing or rales. His right upper arm graft has an excellent thrill and bruit.  He has a palpable right radial pulse.  His  incisions are healing nicely.   MEDICAL ISSUES:  STATUS POST RIGHT UPPER ARM AV GRAFT:  The patient is doing well status post placement of a new right upper arm AV graft. He has no symptoms of steal. I will see him back as needed.   Waverly Ferrariickson, Christopher [facility] [place] [facility] [phone]

## 2016-05-02 ENCOUNTER — Telehealth: Payer: Self-pay | Admitting: *Deleted

## 2016-05-02 NOTE — Telephone Encounter (Signed)
Spoke with Amy at [facility] regarding patient's AVG cannulation. I faxed her Dr. Adele Danickson's last note and also the access record diagram to [phone]. The patient is transferring his dialysis treatments from [facility] starting 05-03-16.

## 2017-09-20 IMAGING — CT CT HEAD W/O CM
3 series · 14 of 47 positions shown, 16 images · non-contrast
Comparison: None.

CLINICAL DATA: Dizziness couple days with uncontrolled
hypertension. End-stage renal disease on dialysis.

EXAM:
CT HEAD WITHOUT CONTRAST
TECHNIQUE: Contiguous axial images were obtained from the base of the skull
through the vertex without intravenous contrast.

[Series 2: head wo · axial · 0.45mm/px · z∈[-172,-47]mm · 8 of 31 slices shown, 10 images]
[im 3/31  brain]
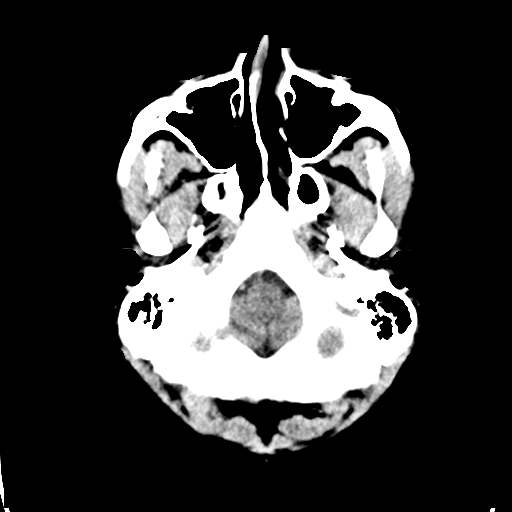
[im 3/31  bone]
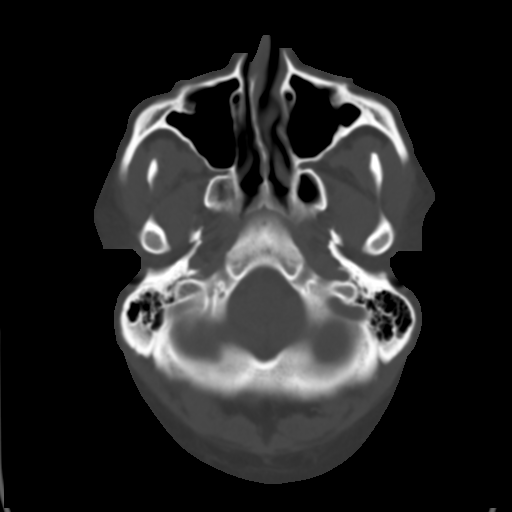
[im 7/31  brain]
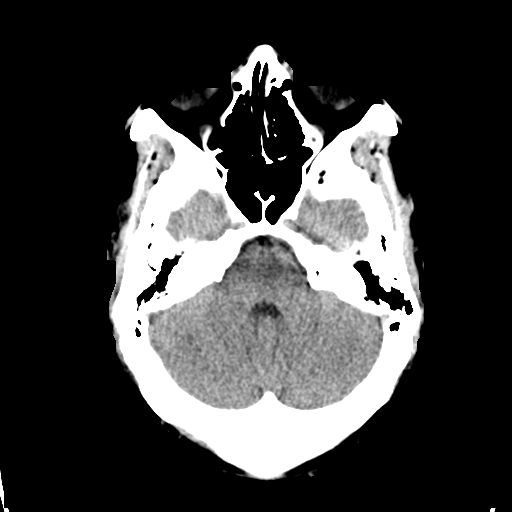
[im 10/31  brain]
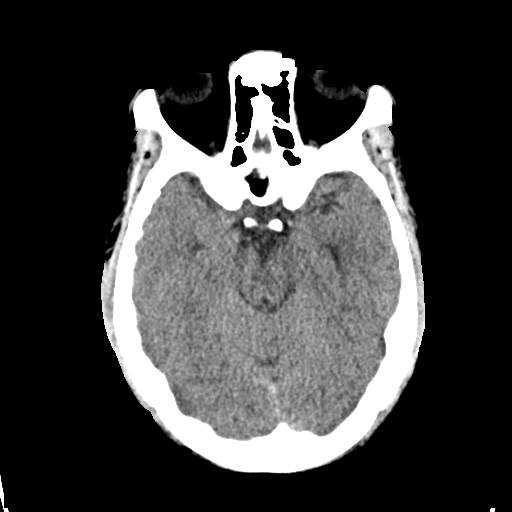
[im 14/31  brain]
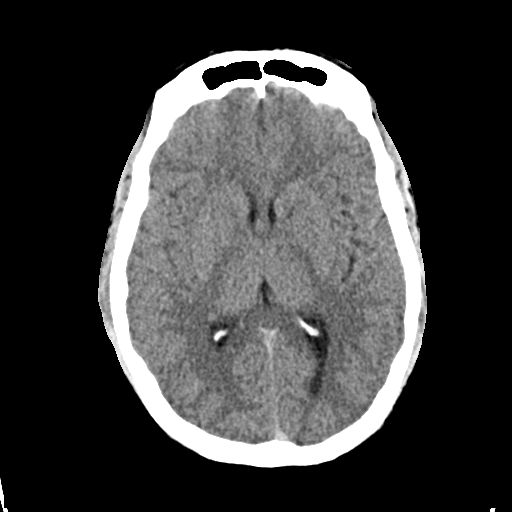
[im 17/31  brain]
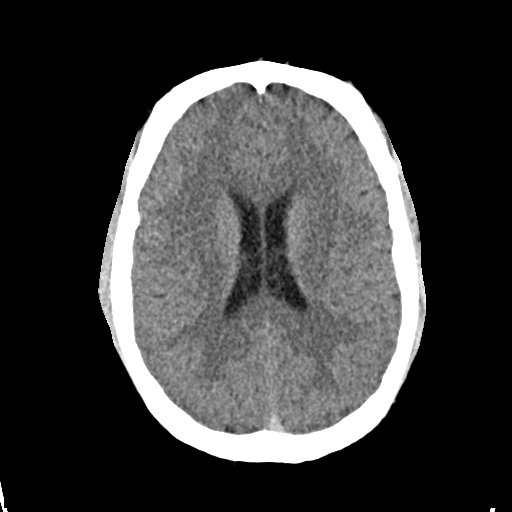
[im 17/31  bone]
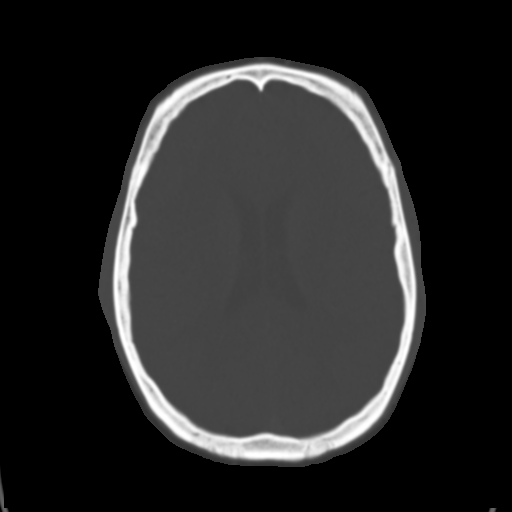
[im 21/31  brain]
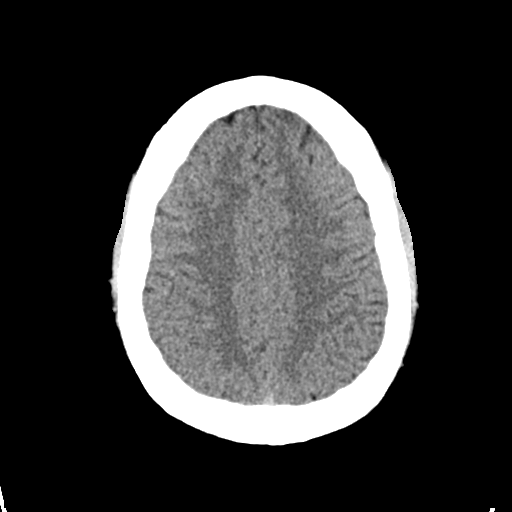
[im 24/31  brain]
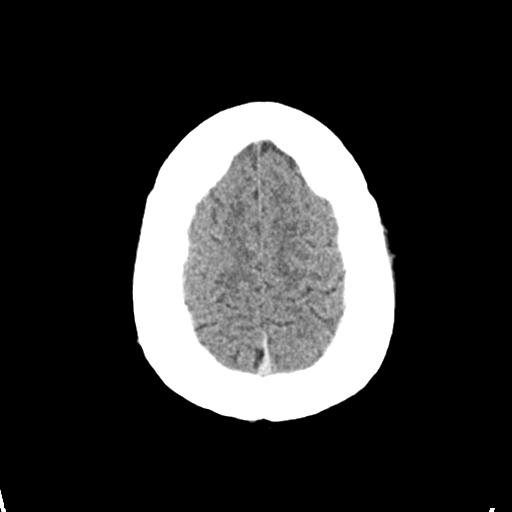
[im 28/31  brain]
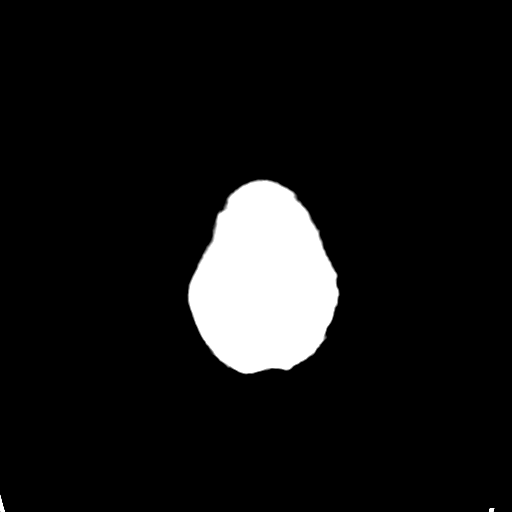

[Series 4: coronal soft · coronal · 0.31mm/px · 3 of 67 slices shown]
[im 23/67  brain]
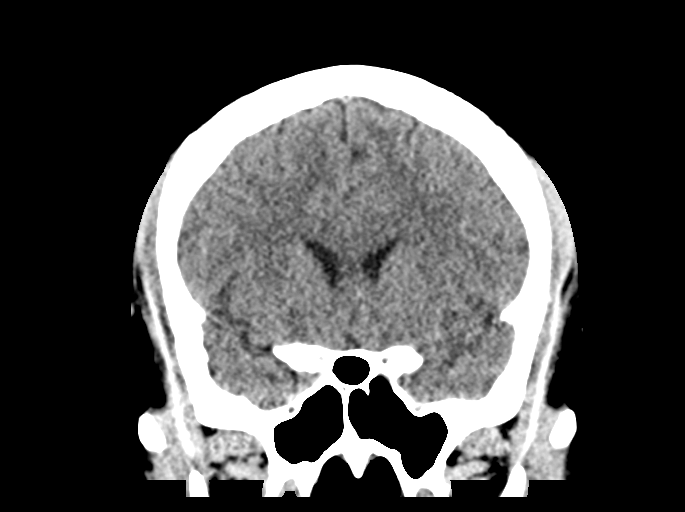
[im 30/67  brain]
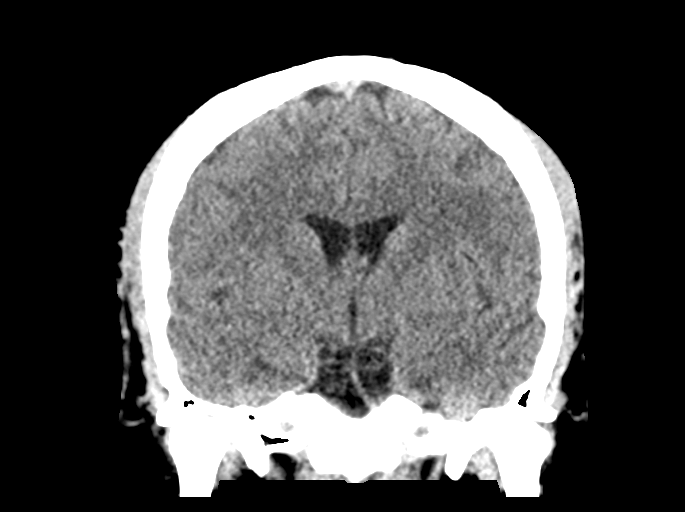
[im 37/67  brain]
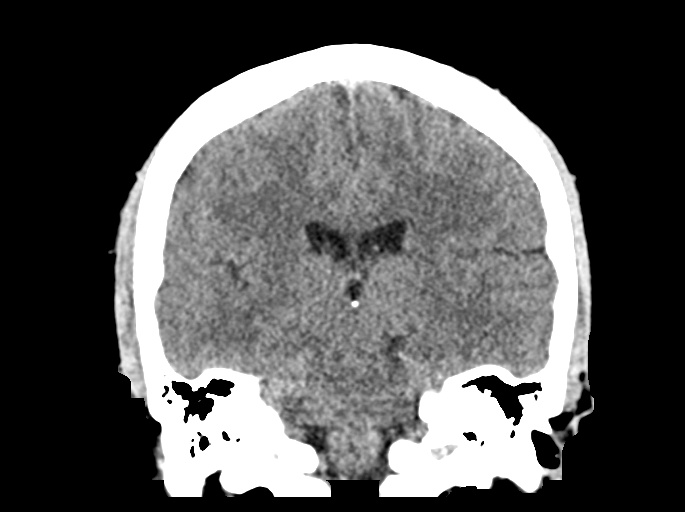

[Series 5: sag soft · sagittal · 0.31mm/px · 3 of 54 slices shown]
[im 18/54  brain]
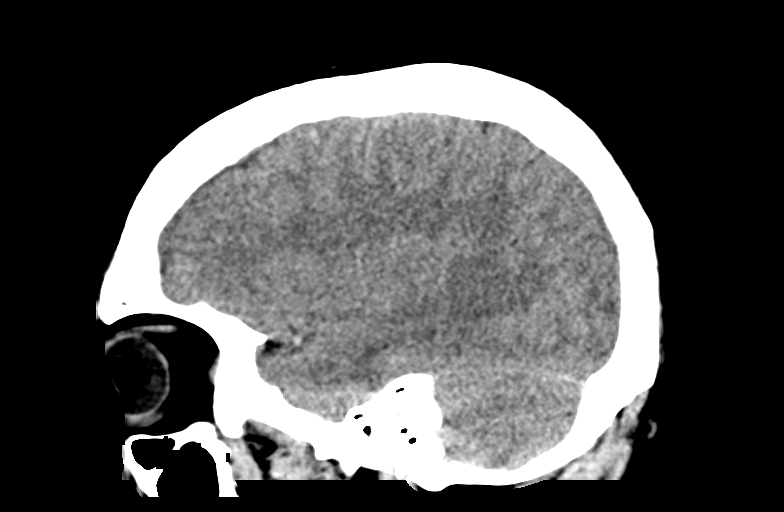
[im 27/54  brain]
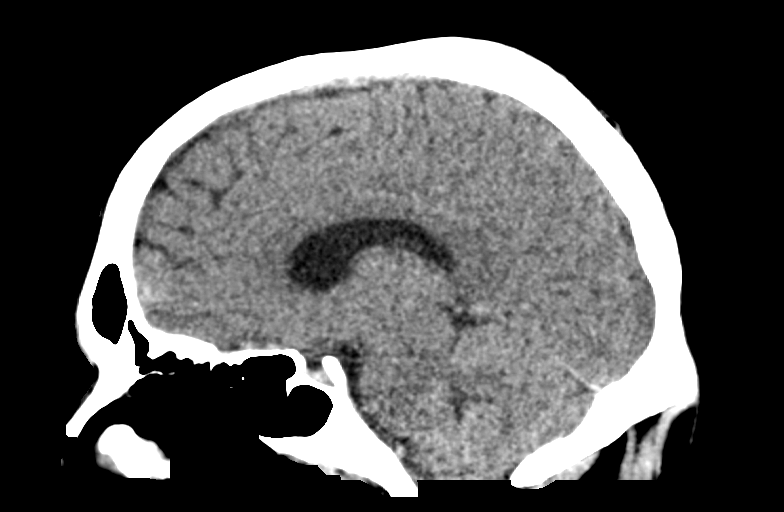
[im 36/54  brain]
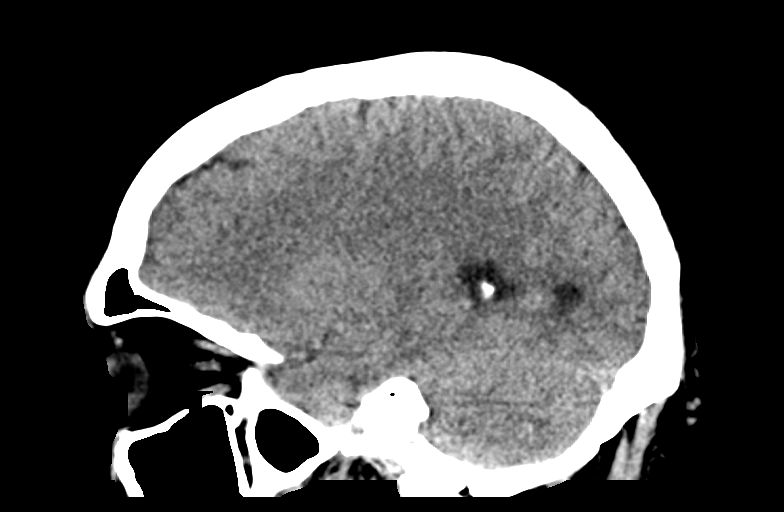

[14 of 47 positions shown; findings below may reference images not displayed]

FINDINGS: Brain: Ventricles, cisterns and other CSF spaces are normal. There
is no mass, mass effect, shift of midline structures or acute
hemorrhage. There is no evidence of acute infarction.

Vascular: Within normal.

Skull: Within normal.

Sinuses/Orbits: Within normal.
IMPRESSION: No acute intracranial findings.
